# Patient Record
Sex: Male | Born: 1985
Health system: Southern US, Community
[De-identification: ages and names within clinical notes are randomized; demographics above are authoritative.]

## PROBLEM LIST (undated history)

## (undated) DIAGNOSIS — F1290 Cannabis use, unspecified, uncomplicated: Secondary | ICD-10-CM

## (undated) DIAGNOSIS — B20 Human immunodeficiency virus [HIV] disease: Secondary | ICD-10-CM

## (undated) DIAGNOSIS — I10 Essential (primary) hypertension: Secondary | ICD-10-CM

## (undated) HISTORY — DX: Human immunodeficiency virus (HIV) disease: B20

---

## 2006-05-04 ENCOUNTER — Emergency Department (HOSPITAL_COMMUNITY): Admission: EM | Admit: 2006-05-04 | Discharge: 2006-05-04 | Payer: Self-pay | Admitting: Emergency Medicine

## 2006-11-04 ENCOUNTER — Encounter: Admission: RE | Admit: 2006-11-04 | Discharge: 2006-11-04 | Payer: Self-pay | Admitting: Internal Medicine

## 2006-11-04 ENCOUNTER — Ambulatory Visit: Payer: Self-pay | Admitting: Internal Medicine

## 2006-11-04 ENCOUNTER — Encounter (INDEPENDENT_AMBULATORY_CARE_PROVIDER_SITE_OTHER): Payer: Self-pay | Admitting: *Deleted

## 2006-11-04 LAB — CONVERTED CEMR LAB: HIV-1 RNA Quant, Log: 4.68 — ABNORMAL HIGH (ref <1.70)

## 2006-11-05 ENCOUNTER — Encounter: Payer: Self-pay | Admitting: Internal Medicine

## 2006-11-05 LAB — CONVERTED CEMR LAB
ALT: 24 units/L (ref 0–53)
Albumin: 4.8 g/dL (ref 3.5–5.2)
Basophils Absolute: 0 10*3/uL (ref 0.0–0.1)
Basophils Relative: 0 % (ref 0–1)
Bilirubin Urine: NEGATIVE
CO2: 23 meq/L (ref 19–32)
Chlamydia, Swab/Urine, PCR: NEGATIVE
Chloride: 109 meq/L (ref 96–112)
Eosinophils Relative: 1 % (ref 0–5)
GC Probe Amp, Urine: NEGATIVE
HCV Ab: NEGATIVE
HIV-1 antibody: POSITIVE — AB
Hemoglobin: 14.2 g/dL (ref 13.0–17.0)
Ketones, ur: NEGATIVE mg/dL
Leukocytes, UA: NEGATIVE
Lymphs Abs: 2 10*3/uL (ref 0.7–3.3)
Monocytes Absolute: 0.4 10*3/uL (ref 0.2–0.7)
Monocytes Relative: 7 % (ref 3–11)
Nitrite: NEGATIVE
Potassium: 4.2 meq/L (ref 3.5–5.3)
RDW: 14.3 % — ABNORMAL HIGH (ref 11.5–14.0)
Specific Gravity, Urine: 1.029 (ref 1.005–1.03)
Urobilinogen, UA: 1 (ref 0.0–1.0)
WBC, UA: NONE SEEN cells/hpf (ref <3)
pH: 6 (ref 5.0–8.0)

## 2006-11-19 ENCOUNTER — Encounter: Payer: Self-pay | Admitting: Internal Medicine

## 2006-11-19 LAB — CONVERTED CEMR LAB: Hep B S Ab: POSITIVE

## 2006-11-23 DIAGNOSIS — B20 Human immunodeficiency virus [HIV] disease: Secondary | ICD-10-CM

## 2006-12-16 ENCOUNTER — Emergency Department (HOSPITAL_COMMUNITY): Admission: EM | Admit: 2006-12-16 | Discharge: 2006-12-16 | Payer: Self-pay | Admitting: *Deleted

## 2007-03-02 ENCOUNTER — Encounter: Payer: Self-pay | Admitting: Internal Medicine

## 2007-03-11 ENCOUNTER — Encounter: Payer: Self-pay | Admitting: Internal Medicine

## 2007-04-30 ENCOUNTER — Emergency Department (HOSPITAL_COMMUNITY): Admission: EM | Admit: 2007-04-30 | Discharge: 2007-04-30 | Payer: Self-pay | Admitting: Emergency Medicine

## 2007-07-07 ENCOUNTER — Emergency Department (HOSPITAL_COMMUNITY): Admission: EM | Admit: 2007-07-07 | Discharge: 2007-07-07 | Payer: Self-pay | Admitting: Emergency Medicine

## 2008-01-19 ENCOUNTER — Ambulatory Visit: Payer: Self-pay | Admitting: Internal Medicine

## 2008-01-19 ENCOUNTER — Encounter: Admission: RE | Admit: 2008-01-19 | Discharge: 2008-01-19 | Payer: Self-pay | Admitting: Internal Medicine

## 2008-01-19 LAB — CONVERTED CEMR LAB
AST: 28 units/L (ref 0–37)
Albumin: 4.5 g/dL (ref 3.5–5.2)
Alkaline Phosphatase: 69 units/L (ref 39–117)
Basophils Absolute: 0 10*3/uL (ref 0.0–0.1)
Basophils Relative: 0 % (ref 0–1)
Chloride: 108 meq/L (ref 96–112)
Eosinophils Absolute: 0.1 10*3/uL (ref 0.0–0.7)
GC Probe Amp, Urine: NEGATIVE
Glucose, Bld: 84 mg/dL (ref 70–99)
HIV-1 RNA Quant, Log: 3.45 — ABNORMAL HIGH (ref <1.70)
Lymphocytes Relative: 50 % — ABNORMAL HIGH (ref 12–46)
MCHC: 32.1 g/dL (ref 30.0–36.0)
MCV: 82.1 fL (ref 78.0–100.0)
Monocytes Absolute: 0.3 10*3/uL (ref 0.1–1.0)
Neutro Abs: 1.8 10*3/uL (ref 1.7–7.7)
Neutrophils Relative %: 40 % — ABNORMAL LOW (ref 43–77)
Platelets: 187 10*3/uL (ref 150–400)
Potassium: 3.8 meq/L (ref 3.5–5.3)
RDW: 14.1 % (ref 11.5–15.5)
Total Bilirubin: 0.4 mg/dL (ref 0.3–1.2)
Urine Glucose: NEGATIVE mg/dL

## 2008-02-01 ENCOUNTER — Ambulatory Visit: Payer: Self-pay | Admitting: Internal Medicine

## 2008-02-21 ENCOUNTER — Encounter: Payer: Self-pay | Admitting: Internal Medicine

## 2008-05-09 ENCOUNTER — Ambulatory Visit: Payer: Self-pay | Admitting: Internal Medicine

## 2008-05-09 LAB — CONVERTED CEMR LAB
ALT: 21 units/L (ref 0–53)
AST: 21 units/L (ref 0–37)
Alkaline Phosphatase: 61 units/L (ref 39–117)
Basophils Relative: 0 % (ref 0–1)
Eosinophils Absolute: 0.1 10*3/uL (ref 0.0–0.7)
Hemoglobin: 13.8 g/dL (ref 13.0–17.0)
Lymphocytes Relative: 49 % — ABNORMAL HIGH (ref 12–46)
Lymphs Abs: 2.2 10*3/uL (ref 0.7–4.0)
MCHC: 32.3 g/dL (ref 30.0–36.0)
MCV: 82.3 fL (ref 78.0–100.0)
Monocytes Absolute: 0.4 10*3/uL (ref 0.1–1.0)
Monocytes Relative: 10 % (ref 3–12)
Neutro Abs: 1.8 10*3/uL (ref 1.7–7.7)
Neutrophils Relative %: 40 % — ABNORMAL LOW (ref 43–77)
Platelets: 203 10*3/uL (ref 150–400)
RBC: 5.19 M/uL (ref 4.22–5.81)
RDW: 13.8 % (ref 11.5–15.5)
Total Bilirubin: 0.3 mg/dL (ref 0.3–1.2)
Total Protein: 8.8 g/dL — ABNORMAL HIGH (ref 6.0–8.3)
WBC: 4.6 10*3/uL (ref 4.0–10.5)

## 2008-06-26 ENCOUNTER — Emergency Department (HOSPITAL_COMMUNITY): Admission: EM | Admit: 2008-06-26 | Discharge: 2008-06-26 | Payer: Self-pay | Admitting: Emergency Medicine

## 2008-10-02 ENCOUNTER — Encounter: Payer: Self-pay | Admitting: Internal Medicine

## 2008-10-17 ENCOUNTER — Encounter: Payer: Self-pay | Admitting: Internal Medicine

## 2009-01-04 ENCOUNTER — Emergency Department (HOSPITAL_COMMUNITY): Admission: EM | Admit: 2009-01-04 | Discharge: 2009-01-04 | Payer: Self-pay | Admitting: Cardiology

## 2009-04-13 ENCOUNTER — Emergency Department (HOSPITAL_COMMUNITY): Admission: EM | Admit: 2009-04-13 | Discharge: 2009-04-13 | Payer: Self-pay | Admitting: Emergency Medicine

## 2009-04-19 ENCOUNTER — Emergency Department (HOSPITAL_COMMUNITY): Admission: EM | Admit: 2009-04-19 | Discharge: 2009-04-19 | Payer: Self-pay | Admitting: Emergency Medicine

## 2009-11-26 ENCOUNTER — Encounter: Payer: Self-pay | Admitting: Internal Medicine

## 2009-11-26 ENCOUNTER — Emergency Department (HOSPITAL_COMMUNITY): Admission: EM | Admit: 2009-11-26 | Discharge: 2009-11-26 | Payer: Self-pay | Admitting: Emergency Medicine

## 2009-12-05 ENCOUNTER — Ambulatory Visit: Payer: Self-pay | Admitting: Internal Medicine

## 2009-12-05 LAB — CONVERTED CEMR LAB
ALT: 15 units/L (ref 0–53)
AST: 16 units/L (ref 0–37)
Alkaline Phosphatase: 71 units/L (ref 39–117)
BUN: 10 mg/dL (ref 6–23)
Basophils Relative: 0 % (ref 0–1)
CO2: 23 meq/L (ref 19–32)
Calcium: 9.6 mg/dL (ref 8.4–10.5)
Cholesterol: 140 mg/dL (ref 0–200)
Eosinophils Absolute: 0.1 10*3/uL (ref 0.0–0.7)
Eosinophils Relative: 2 % (ref 0–5)
GC Probe Amp, Urine: NEGATIVE
HIV-1 RNA Quant, Log: 2.82 — ABNORMAL HIGH (ref <1.68)
HIV-1 antibody: POSITIVE — AB
HIV-2 Ab: UNDETERMINED — AB
HIV: REACTIVE
Hemoglobin: 12.2 g/dL — ABNORMAL LOW (ref 13.0–17.0)
Hepatitis B Surface Ag: NEGATIVE
Ketones, ur: NEGATIVE mg/dL
Leukocytes, UA: NEGATIVE
Lymphocytes Relative: 44 % (ref 12–46)
Lymphs Abs: 2.1 10*3/uL (ref 0.7–4.0)
Monocytes Absolute: 0.3 10*3/uL (ref 0.1–1.0)
Neutro Abs: 2.3 10*3/uL (ref 1.7–7.7)
Potassium: 3.8 meq/L (ref 3.5–5.3)
RBC: 4.66 M/uL (ref 4.22–5.81)
RDW: 13.2 % (ref 11.5–15.5)
Sodium: 140 meq/L (ref 135–145)
Total Bilirubin: 0.3 mg/dL (ref 0.3–1.2)
Total CHOL/HDL Ratio: 5.6
Triglycerides: 302 mg/dL — ABNORMAL HIGH (ref <150)
Urine Glucose: NEGATIVE mg/dL
WBC: 4.8 10*3/uL (ref 4.0–10.5)

## 2009-12-23 ENCOUNTER — Encounter: Payer: Self-pay | Admitting: Internal Medicine

## 2009-12-24 ENCOUNTER — Ambulatory Visit: Payer: Self-pay | Admitting: Internal Medicine

## 2009-12-24 DIAGNOSIS — I10 Essential (primary) hypertension: Secondary | ICD-10-CM | POA: Insufficient documentation

## 2010-10-14 NOTE — Miscellaneous (Signed)
Summary: Orders Update  Clinical Lists Changes  Orders: Added new Test order of T-Comprehensive Metabolic Panel [phone]) - Signed Added new Test order of T-CBC w/Diff [phone]) - Signed Added new Test order of T-CD4 [phone]) - Signed Added new Test order of T-HIV Viral Load [phone]) - Signed Added new Test order of T-Chlamydia  Probe, urine [id number]) - Signed Added new Test order of T-GC Probe, urine [phone]) - Signed Added new Test order of T-Hepatitis B Surface Antigen [phone]) - Signed Added new Test order of T-Hepatitis B Surface Antibody ([id number]) - Signed Added new Test order of T-Hepatitis B Core Antibody ([id number]) - Signed Added new Test order of T-Hepatitis C Antibody ([id number]) - Signed Added new Test order of T-HIV Antibody  (Reflex) ([id number]) - Signed Added new Test order of T-HIV Ab Confirmatory Test/Western Blot ([id number]) - Signed Added new Test order of T-Lipid Profile ([id number]) - Signed Added new Test order of T-RPR (Syphilis) ([id number]) - Signed Added new Test order of T-Urinalysis ([id number]) - Signed       Process Orders Check Orders Results:     [facility]: ABN not required for this insurance Tests Sent for requisitioning (December 03, 2009 3:02 PM):     01/10/2010: [facility] -- T-Comprehensive Metabolic Panel [[id number]] (signed)     01/10/2010: [facility] -- T-CBC w/Diff [[id number]] (signed)     01/10/2010: [facility] -- T-CD4 [phone] (signed)     01/10/2010: [facility] -- T-HIV Viral Load [phone] (signed)     01/10/2010: [facility] -- T-Chlamydia  Probe, urine [phone] (signed)     01/10/2010: [facility] -- T-GC Probe, urine [phone] (signed)     01/10/2010: [facility] -- T-Hepatitis B Surface Antigen [[id number]] (signed)     01/10/2010:  [facility] -- T-Hepatitis B Surface Antibody [[id number]] (signed)     01/10/2010: [facility] -- T-Hepatitis B Core Antibody [[id number]] (signed)     01/10/2010: [facility] -- T-Hepatitis C Antibody [[id number]] (signed)     01/10/2010: [facility] -- T-HIV Antibody  (Reflex) [[id number]] (signed)     01/10/2010: [facility] -- T-HIV Ab Confirmatory Test/Western Blot [phone] (signed)     01/10/2010: [facility] -- T-Lipid Profile [phone] (signed)     01/10/2010: [facility] -- T-RPR (Syphilis) [phone] (signed)     01/10/2010: [facility] -- T-Urinalysis [[id number]] (signed)

## 2010-10-14 NOTE — Assessment & Plan Note (Signed)
Summary: routine ck/last seen 09/vs   CC:  follow-up visit, lab results, B/P high today, and complaining of frequent cold symptoms.  History of Present Illness: Pt c/o night sweats and recurrent colds particularly when he is around children.  He was concerned it might be related to his HIV so he came in to get his labs checked. He has also been told at the health dept that his BP is elevated. Pt also needs a PPD for work.  Preventive Screening-Counseling & Management  Alcohol-Tobacco     Alcohol drinks/day: week ends     Smoking Status: current     Packs/Day: 1/2  Caffeine-Diet-Exercise     Caffeine use/day: soda occasional     Does Patient Exercise: yes     Type of exercise: running     Times/week: 3  Safety-Violence-Falls     Seat Belt Use: yes      Drug Use:  Yes.     Updated Prior Medication List: ATENOLOL 50 MG TABS (ATENOLOL) Take 1 tablet by mouth once a day  Current Allergies (reviewed today): No known allergies  Past History:  Past Medical History: Last updated: 11/19/2006 HIV disease  Review of Systems  The patient denies anorexia, fever, weight loss, chest pain, and headaches.    Vital Signs:  Patient profile:   25 year old male Height:      70 inches (177.80 cm) Weight:      197.0 pounds (89.55 kg) BMI:     28.37 Temp:     98.4 degrees F (36.89 degrees C) oral Pulse rate:   64 / minute BP sitting:   160 / 94  (right arm)  Vitals Entered By: Wendall Mola CMA Duncan Dull) (December 24, 2009 3:01 PM) CC: follow-up visit, lab results, B/P high today, complaining of frequent cold symptoms Is Patient Diabetic? No Pain Assessment Patient in pain? no      Nutritional Status BMI of 25 - 29 = overweight Nutritional Status Detail appetite "sometimes not good"  Have you ever been in a relationship where you felt threatened, hurt or afraid?No   Does patient need assistance? Functional Status Self care Ambulation Normal   Physical Exam  General:   alert, well-developed, well-nourished, and well-hydrated.   Head:  normocephalic and atraumatic.   Mouth:  pharynx pink and moist.  no thrush  Lungs:  normal breath sounds.   Heart:  normal rate and regular rhythm.      Impression & Recommendations:  Problem # 1:  HIV DISEASE (ICD-042) Pt currently not on therapy. We discussed starting treatment even though his CD4ct is 790 and VL low since he is having symptoms that may or may not be related to his HIV.  Pt would like to hold off for now. He will return in 6 months for repeat labs. PPD was placed. Diagnostics Reviewed:  HIV: REACTIVE (12/05/2009)   HIV-Western blot: Positive (12/05/2009)   CD4: 790 (12/06/2009)   WBC: 4.8 (12/05/2009)   Hgb: 12.2 (12/05/2009)   HCT: 38.1 (12/05/2009)   Platelets: 237 (12/05/2009) HIV-1 RNA: 666 (12/05/2009)   HBSAg: NEG (12/05/2009)  Problem # 2:  ESSENTIAL HYPERTENSION, BENIGN (ICD-401.1) Will start atenolol and re-check BP. His updated medication list for this problem includes:    Atenolol 50 Mg Tabs (Atenolol) .Marland Kitchen... Take 1 tablet by mouth once a day  BP today: 160/94 Prior BP: 159/88 (02/01/2008)  Labs Reviewed: K+: 3.8 (12/05/2009) Creat: : 0.94 (12/05/2009)   Chol: 140 (12/05/2009)   HDL: 25 (  12/05/2009)   LDL: 55 (12/05/2009)   TG: 302 (12/05/2009)  Medications Added to Medication List This Visit: 1)  Atenolol 50 Mg Tabs (Atenolol) .... Take 1 tablet by mouth once a day  Other Orders: Est. Patient Level III (04540) TB Skin Test [phone]) Admin 1st Vaccine (14782) Future Orders: T-CD4SP ([facility]) (CD4SP) ... 06/22/2010 T-HIV Viral Load [id number]) ... 06/22/2010 T-Comprehensive Metabolic Panel [phone]) ... 06/22/2010 T-CBC w/Diff ([id number]) ... 06/22/2010  Patient Instructions: 1)  Please schedule a follow-up appointment in 6 months, 2 weeks after labs.  Prescriptions: ATENOLOL 50 MG TABS (ATENOLOL) Take 1 tablet by mouth once a day  #30 x 5   Entered and Authorized by:    Yisroel Ramming MD   Signed by:   Yisroel Ramming MD on 12/24/2009   Method used:   Print then Give to Patient   RxID:   [id number]    Immunizations Administered:  PPD Skin Test:    Vaccine Type: PPD    Site: left forearm    Mfr: Sanofi Pasteur    Dose: 0.1 ml    Route: ID    Given by: Wendall Mola CMA ( AAMA)    Exp. Date: 01/17/2012    Lot #: [id number]   Appended Document: TB skin test negative    Clinical Lists Changes  Observations: Added new observation of TB PPDRESULT: negative (12/26/2009 15:28) Added new observation of PPD RESULT: < 5mm (12/26/2009 15:28) Added new observation of TB-PPD RDDTE: 12/26/2009 (12/26/2009 15:28)       PPD Results    Date of reading: 12/26/2009    Results: < 5mm    Interpretation: negative

## 2010-10-14 NOTE — Miscellaneous (Signed)
Summary: [facility]   Imported By: Margie Billet 12/24/2009 11:22:19  _____________________________________________________________________  External Attachment:    Type:   Image     Comment:   External Document

## 2010-12-07 LAB — CULTURE, ROUTINE-ABSCESS

## 2010-12-07 LAB — T-HELPER CELL (CD4) - (RCID CLINIC ONLY)
CD4 % Helper T Cell: 39 % (ref 33–55)
CD4 T Cell Abs: 790 uL (ref 400–2700)

## 2010-12-20 LAB — URINALYSIS, ROUTINE W REFLEX MICROSCOPIC
Bilirubin Urine: NEGATIVE
Nitrite: NEGATIVE
pH: 6.5 (ref 5.0–8.0)

## 2010-12-20 LAB — GONOCOCCUS DNA, PCR: GC Probe Amp, Genital: NEGATIVE

## 2011-01-07 ENCOUNTER — Other Ambulatory Visit: Payer: Self-pay | Admitting: *Deleted

## 2011-01-07 DIAGNOSIS — B20 Human immunodeficiency virus [HIV] disease: Secondary | ICD-10-CM

## 2011-01-07 DIAGNOSIS — I10 Essential (primary) hypertension: Secondary | ICD-10-CM

## 2011-01-15 ENCOUNTER — Other Ambulatory Visit: Payer: Self-pay | Admitting: Adult Health

## 2011-01-15 ENCOUNTER — Other Ambulatory Visit: Payer: Self-pay

## 2011-01-15 DIAGNOSIS — Z79899 Other long term (current) drug therapy: Secondary | ICD-10-CM

## 2011-01-15 DIAGNOSIS — B20 Human immunodeficiency virus [HIV] disease: Secondary | ICD-10-CM

## 2011-01-15 DIAGNOSIS — Z113 Encounter for screening for infections with a predominantly sexual mode of transmission: Secondary | ICD-10-CM

## 2011-01-29 ENCOUNTER — Ambulatory Visit: Payer: Self-pay | Admitting: Adult Health

## 2011-06-26 LAB — URINE CULTURE: Colony Count: NO GROWTH

## 2011-06-26 LAB — URINE MICROSCOPIC-ADD ON

## 2011-06-26 LAB — URINALYSIS, ROUTINE W REFLEX MICROSCOPIC
Bilirubin Urine: NEGATIVE
Glucose, UA: NEGATIVE
Hgb urine dipstick: NEGATIVE
Nitrite: NEGATIVE
Urobilinogen, UA: 0.2
pH: 6

## 2011-06-26 LAB — GC/CHLAMYDIA PROBE AMP, GENITAL: Chlamydia, DNA Probe: POSITIVE — AB

## 2012-06-28 ENCOUNTER — Encounter (HOSPITAL_COMMUNITY): Payer: Self-pay | Admitting: Emergency Medicine

## 2012-06-28 ENCOUNTER — Emergency Department (HOSPITAL_COMMUNITY)
Admission: EM | Admit: 2012-06-28 | Discharge: 2012-06-29 | Disposition: A | Discharge status: Home / Self Care | Payer: Self-pay | Attending: Emergency Medicine | Admitting: Emergency Medicine

## 2012-06-28 ENCOUNTER — Encounter (HOSPITAL_COMMUNITY): Payer: Self-pay | Admitting: *Deleted

## 2012-06-28 ENCOUNTER — Emergency Department (INDEPENDENT_AMBULATORY_CARE_PROVIDER_SITE_OTHER)
Admission: EM | Admit: 2012-06-28 | Discharge: 2012-06-28 | Disposition: A | Discharge status: Nursing Home (Includes ICF) | Payer: Self-pay | Source: Home / Self Care | Attending: Family Medicine | Admitting: Family Medicine

## 2012-06-28 DIAGNOSIS — I10 Essential (primary) hypertension: Secondary | ICD-10-CM | POA: Insufficient documentation

## 2012-06-28 DIAGNOSIS — H409 Unspecified glaucoma: Secondary | ICD-10-CM

## 2012-06-28 DIAGNOSIS — F17200 Nicotine dependence, unspecified, uncomplicated: Secondary | ICD-10-CM | POA: Insufficient documentation

## 2012-06-28 DIAGNOSIS — H5789 Other specified disorders of eye and adnexa: Secondary | ICD-10-CM | POA: Insufficient documentation

## 2012-06-28 DIAGNOSIS — H40219 Acute angle-closure glaucoma, unspecified eye: Secondary | ICD-10-CM

## 2012-06-28 DIAGNOSIS — H209 Unspecified iridocyclitis: Secondary | ICD-10-CM

## 2012-06-28 HISTORY — DX: Essential (primary) hypertension: I10

## 2012-06-28 MED ORDER — PREDNISOLONE ACETATE 1 % OP SUSP
2.0000 [drp] | OPHTHALMIC | Status: DC
  Administered 2012-06-29: 2 [drp] via OPHTHALMIC
  Filled 2012-06-28: qty 1

## 2012-06-28 MED ORDER — TETRACAINE HCL 0.5 % OP SOLN
2.0000 [drp] | Freq: Once | OPHTHALMIC | Status: AC
  Administered 2012-06-28: 2 [drp] via OPHTHALMIC
  Filled 2012-06-28: qty 2

## 2012-06-28 MED ORDER — TETRACAINE HCL 0.5 % OP SOLN
OPHTHALMIC | Status: AC
  Filled 2012-06-28: qty 2

## 2012-06-28 NOTE — ED Provider Notes (Signed)
History     CSN: [id number]  Arrival date & time 06/28/12  1954   First MD Initiated Contact with Patient 06/28/12 1956      No chief complaint on file.   (Consider location/radiation/quality/duration/timing/severity/associated sxs/prior treatment) Patient is a 26 y.o. male presenting with eye pain. The history is provided by the patient.  Eye Pain This is a new problem. The current episode started more than 2 days ago. The problem has been gradually worsening. Associated symptoms comments: Photophobia  And eye redness..    No past medical history on file.  No past surgical history on file.  No family history on file.  History  Substance Use Topics  . Smoking status: Not on file  . Smokeless tobacco: Not on file  . Alcohol Use: Not on file      Review of Systems  Constitutional: Negative.   HENT: Negative.   Eyes: Positive for photophobia, pain and redness. Negative for discharge, itching and visual disturbance.    Allergies  Review of patient's allergies indicates not on file.  Home Medications   Current Outpatient Rx  Name Route Sig Dispense Refill  . ATENOLOL 50 MG PO TABS Oral Take 50 mg by mouth daily.        BP 212/115  Pulse 78  Temp 99.1 F (37.3 C) (Oral)  Resp 18  SpO2 100%  Physical Exam  Nursing note and vitals reviewed. Constitutional: He appears well-developed and well-nourished.  HENT:  Head: Normocephalic.  Right Ear: External ear normal.  Left Ear: External ear normal.  Mouth/Throat: Oropharynx is clear and moist.  Eyes: EOM are normal. Pupils are equal, round, and reactive to light. Right eye exhibits no discharge. Left eye exhibits no discharge. Right conjunctiva is injected. Left conjunctiva is not injected. Left conjunctiva has no hemorrhage.  Slit lamp exam:      The right eye shows no corneal abrasion, no corneal flare, no corneal ulcer, no foreign body, no hyphema, no hypopyon, no fluorescein uptake and no anterior chamber  bulge.    ED Course  Procedures (including critical care time)  Labs Reviewed - No data to display No results found.   1. Acute glaucoma       MDM  tonopen IOP x2 were 32 and 38 by 2 different operators.        Linna Hoff, MD 06/28/12 2113

## 2012-06-28 NOTE — ED Notes (Signed)
Report received from RN Kim at [facility].  Sent here for eye pain, redness and increased intraocular pressure.  Pt with hx of htn and has not been taking bp meds for 6 months.
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
## 2012-06-28 NOTE — ED Notes (Signed)
Eye redness , pain, unknown injury

## 2012-06-28 NOTE — ED Provider Notes (Signed)
History     CSN: [id number]  Arrival date & time 06/28/12  2126   First MD Initiated Contact with Patient 06/28/12 2209      Chief Complaint  Patient presents with  . Eye Injury  . Pressure Behind the Eyes    (Consider location/radiation/quality/duration/timing/severity/associated sxs/prior treatment) Patient is a 26 y.o. male presenting with eye injury. The history is provided by the patient.  Eye Injury   Patient here with right thigh redness and photophobia x2 days. Patient feels pressure behind his eyes. Denies any vision loss of the right side. History of similar symptoms in the past for which she gets steroid shots. He has never been diagnosed with glaucoma although told she did have an increased intraocular pressure. He went to urgent care Center today and his intraocular pressure was elevated and he was sent here. He denies any headache, vomiting. Does note pain in his right eye when light shown in the left. Denies any foreign body sensation. Patient denies any blurred vision. Patient denies any pain his temple Past Medical History  Diagnosis Date  . Hypertension     History reviewed. No pertinent past surgical history.  History reviewed. No pertinent family history.  History  Substance Use Topics  . Smoking status: Current Every Day Smoker  . Smokeless tobacco: Not on file  . Alcohol Use: No      Review of Systems  All other systems reviewed and are negative.    Allergies  Review of patient's allergies indicates no known allergies.  Home Medications  No current outpatient prescriptions on file.  BP 163/119  Pulse 68  Temp 98.9 F (37.2 C) (Oral)  Resp 16  SpO2 98%  Physical Exam  Nursing note and vitals reviewed. Constitutional: He is oriented to person, place, and time. He appears well-developed and well-nourished.  Non-toxic appearance. No distress.  HENT:  Head: Normocephalic and atraumatic.  Eyes: EOM and lids are normal. Pupils are equal,  round, and reactive to light. Right eye exhibits no discharge and no exudate. Right conjunctiva is injected. Right conjunctiva has no hemorrhage. No scleral icterus. Right eye exhibits normal extraocular motion and no nystagmus. Pupils are equal.       No signs of afferent pupillary light defect.  Neck: Normal range of motion. Neck supple. No tracheal deviation present. No mass present.  Cardiovascular: Normal rate, regular rhythm and normal heart sounds.  Exam reveals no gallop.   No murmur heard. Pulmonary/Chest: Effort normal and breath sounds normal. No stridor. No respiratory distress. He has no decreased breath sounds. He has no wheezes. He has no rhonchi. He has no rales.  Abdominal: Soft. Normal appearance and bowel sounds are normal. He exhibits no distension. There is no tenderness. There is no rebound and no CVA tenderness.  Musculoskeletal: Normal range of motion. He exhibits no edema and no tenderness.  Neurological: He is alert and oriented to person, place, and time. He has normal strength. No cranial nerve deficit or sensory deficit. GCS eye subscore is 4. GCS verbal subscore is 5. GCS motor subscore is 6.  Skin: Skin is warm and dry. No abrasion and no rash noted.  Psychiatric: He has a normal mood and affect. His speech is normal and behavior is normal.    ED Course  Procedures (including critical care time)  Labs Reviewed - No data to display No results found.   No diagnosis found.    MDM  Patient had tonopen performed here and I pressures measured  10,9 and 12. We'll speak with ophthalmology on call  11:05 PM Spoke with the ophthalmologist on call, dr. Clarisa Kindred, pt to be started on Pred Forte here and given a bottle to go home with. The patient will be seen by her tomorrow in the office. 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 He has no headache he has no vomiting hasn't visual changes his cornea is normal. Patient relates having similar symptoms in the past treated with  steroids which reason to believe that patient likely has iritis.     Toy Baker, MD 06/28/12 [id number]

## 2012-06-29 NOTE — ED Notes (Signed)
Charge nurse called for taxi voucher since pt missed bus due to waiting for pharmacy to send his medication.

## 2012-06-29 NOTE — ED Notes (Signed)
Request for med sent to pharmacy and pharmacy called and told that pt needed med for discharge

## 2012-06-29 NOTE — ED Notes (Signed)
Pt given taxi voucher by charge nurse

## 2012-06-29 NOTE — ED Notes (Signed)
Pharmacy called again for medication for pt discharge

## 2013-02-07 ENCOUNTER — Ambulatory Visit: Payer: Self-pay

## 2013-02-07 ENCOUNTER — Ambulatory Visit (INDEPENDENT_AMBULATORY_CARE_PROVIDER_SITE_OTHER): Payer: Self-pay

## 2013-02-07 DIAGNOSIS — B20 Human immunodeficiency virus [HIV] disease: Secondary | ICD-10-CM

## 2013-02-07 DIAGNOSIS — I10 Essential (primary) hypertension: Secondary | ICD-10-CM

## 2013-02-07 DIAGNOSIS — Z23 Encounter for immunization: Secondary | ICD-10-CM

## 2013-02-07 LAB — CBC WITH DIFFERENTIAL/PLATELET
Eosinophils Absolute: 0.1 10*3/uL (ref 0.0–0.7)
Eosinophils Relative: 3 % (ref 0–5)
Hemoglobin: 12.4 g/dL — ABNORMAL LOW (ref 13.0–17.0)
Lymphocytes Relative: 48 % — ABNORMAL HIGH (ref 12–46)
Lymphs Abs: 1.8 10*3/uL (ref 0.7–4.0)
MCH: 26.8 pg (ref 26.0–34.0)
MCV: 77.5 fL — ABNORMAL LOW (ref 78.0–100.0)
Monocytes Absolute: 0.4 10*3/uL (ref 0.1–1.0)
Monocytes Relative: 9 % (ref 3–12)
Neutrophils Relative %: 40 % — ABNORMAL LOW (ref 43–77)
Platelets: 203 10*3/uL (ref 150–400)
RBC: 4.62 MIL/uL (ref 4.22–5.81)
RDW: 14.9 % (ref 11.5–15.5)

## 2013-02-07 LAB — COMPLETE METABOLIC PANEL WITH GFR
AST: 25 U/L (ref 0–37)
Albumin: 4.2 g/dL (ref 3.5–5.2)
Alkaline Phosphatase: 72 U/L (ref 39–117)
Chloride: 107 mEq/L (ref 96–112)
Creat: 1.07 mg/dL (ref 0.50–1.35)
GFR, Est Non African American: >89 mL/min
Glucose, Bld: 85 mg/dL (ref 70–99)
Potassium: 4 mEq/L (ref 3.5–5.3)
Total Protein: 8.4 g/dL — ABNORMAL HIGH (ref 6.0–8.3)

## 2013-02-07 LAB — LIPID PANEL
HDL: 34 mg/dL — ABNORMAL LOW (ref >39)
LDL Cholesterol: 76 mg/dL (ref 0–99)
Triglycerides: 115 mg/dL (ref <150)

## 2013-02-07 LAB — HEPATITIS C ANTIBODY: HCV Ab: NEGATIVE

## 2013-02-08 LAB — HEPATITIS A ANTIBODY, TOTAL: Hep A Total Ab: NEGATIVE

## 2013-02-08 LAB — URINALYSIS
Hgb urine dipstick: NEGATIVE
Ketones, ur: NEGATIVE mg/dL
Leukocytes, UA: NEGATIVE
Nitrite: NEGATIVE
Specific Gravity, Urine: 1.027 (ref 1.005–1.030)
Urobilinogen, UA: 0.2 mg/dL (ref 0.0–1.0)
pH: 6 (ref 5.0–8.0)

## 2013-02-08 LAB — HEPATITIS B CORE ANTIBODY, TOTAL: Hep B Core Total Ab: NEGATIVE

## 2013-02-08 LAB — T-HELPER CELL (CD4) - (RCID CLINIC ONLY): CD4 T Cell Abs: 550 uL (ref 400–2700)

## 2013-02-08 LAB — HIV-1 RNA ULTRAQUANT REFLEX TO GENTYP+: HIV 1 RNA Quant: 8613 copies/mL — ABNORMAL HIGH (ref <20)

## 2013-02-14 LAB — TB SKIN TEST
Induration: 0 mm
TB Skin Test: NEGATIVE

## 2013-02-17 NOTE — Progress Notes (Signed)
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  She has concerns about her CD 4 decreasing and being diagnosed with AIDS. She has been checking her BP was has been elevated several times and last reading was 174/126 and admits to her Mother sharing blood pressure medication.  Since she has no insurance she did not seek medical attention .   Laurell Josephs, RN

## 2013-02-22 ENCOUNTER — Ambulatory Visit (INDEPENDENT_AMBULATORY_CARE_PROVIDER_SITE_OTHER): Payer: Self-pay | Admitting: Internal Medicine

## 2013-02-22 ENCOUNTER — Encounter: Payer: Self-pay | Admitting: Internal Medicine

## 2013-02-22 VITALS — BP 168/112 | HR 67 | Temp 98.6°F | Ht 70.0 in | Wt 197.0 lb

## 2013-02-22 DIAGNOSIS — F17200 Nicotine dependence, unspecified, uncomplicated: Secondary | ICD-10-CM

## 2013-02-22 DIAGNOSIS — B20 Human immunodeficiency virus [HIV] disease: Secondary | ICD-10-CM

## 2013-02-22 DIAGNOSIS — Z720 Tobacco use: Secondary | ICD-10-CM

## 2013-02-22 DIAGNOSIS — I10 Essential (primary) hypertension: Secondary | ICD-10-CM

## 2013-02-22 MED ORDER — AMLODIPINE BESYLATE 5 MG PO TABS: 5.0000 mg | ORAL_TABLET | Freq: Every day | ORAL | Status: DC

## 2013-02-22 MED ORDER — ELVITEG-COBIC-EMTRICIT-TENOFDF 150-150-200-300 MG PO TABS: 1.0000 | ORAL_TABLET | Freq: Every day | ORAL | Status: DC

## 2013-02-22 NOTE — Assessment & Plan Note (Signed)
He is interested in treatment I did discuss with him treatment options including Stribild. I did remind him to take it with food to avoid nausea. He has applied for drug assistance program and wants to get the medication he will start it. I will followup with him in 3 weeks to assure he got the medication and also follow his blood pressure.

## 2013-02-22 NOTE — Assessment & Plan Note (Signed)
I did discuss smoking cessation. He is not interested any help at this time. He is going to try to quit on his own though did not necessarily have the plan. He does understand the importance with high blood pressure of smoking cessation.

## 2013-02-22 NOTE — Progress Notes (Signed)
  Subjective:    Patient ID: Clifford Shields, male    DOB: [birth year], 27 y.o.   MRN: [id number]  HPI He comes in to reestablish care for HIV. He was initially diagnosed in 2007 and was seen in clinic here in 2008 however had remained off treatment due to previous guidelines the did not recommend treatment above 500 for his CD4 count. He then moved to [place] and then [place] before returning last year to [place]. He remained ARV nave. He is here interested in starting therapy.  He does describe not wanting treatment previously however understands now the importance of treatment. He does feel he will be able to take medicine daily. He is also interested in hormone therapy as a transgender male to male however is also nervous about side effects from hormone therapy. He has no weight loss, no diarrhea. His risk factor is MSM. He understands importance of condoms. He is hoping to get his own place. No rashes, lymphadenopathy, no abdominal pain. He has some minimal leg edema. No headaches or vision changes. He does take atenolol from his friend since he has noted significantly elevated blood pressure.  He also used to take atenolol when he previously came to this clinic for his high blood pressure.   Review of Systems  Constitutional: Positive for fatigue. Negative for fever and unexpected weight change.  HENT: Negative for sore throat and trouble swallowing.   Eyes: Negative for pain and visual disturbance.  Respiratory: Negative for cough and shortness of breath.   Cardiovascular: Positive for leg swelling. Negative for chest pain and palpitations.  Gastrointestinal: Negative for nausea, abdominal pain and diarrhea.  Genitourinary: Negative for dysuria, hematuria, difficulty urinating and genital sores.  Musculoskeletal: Negative for myalgias, joint swelling and arthralgias.  Skin: Negative for rash.  Neurological: Negative for dizziness, weakness, light-headedness and headaches.   Hematological: Negative for adenopathy.  Psychiatric/Behavioral: Negative for dysphoric mood.       Objective:   Physical Exam  Constitutional: He is oriented to person, place, and time. He appears well-developed and well-nourished. No distress.  HENT:  Mouth/Throat: Oropharynx is clear and moist. No oropharyngeal exudate.  Eyes: Right eye exhibits no discharge. Left eye exhibits no discharge. No scleral icterus.  Cardiovascular: Normal rate, regular rhythm and normal heart sounds.   No murmur heard. Pulmonary/Chest: Effort normal and breath sounds normal. No respiratory distress. He has no wheezes. He has no rales.  Abdominal: Soft. Bowel sounds are normal. He exhibits no distension. There is no tenderness.  Musculoskeletal: He exhibits no edema.  Lymphadenopathy:    He has no cervical adenopathy.  Neurological: He is alert and oriented to person, place, and time.  Skin: Skin is warm and dry. No rash noted.          Assessment & Plan:

## 2013-02-22 NOTE — Assessment & Plan Note (Signed)
His blood pressure is poorly controlled at this time. I will try him on Norvasc 5 mg first and see what his blood pressure is on that at his next visit. Did discuss the long-term effects of high blood pressure including kidney problems and cardiac issues

## 2013-03-06 ENCOUNTER — Other Ambulatory Visit: Payer: Self-pay | Admitting: Licensed Clinical Social Worker

## 2013-03-06 DIAGNOSIS — B20 Human immunodeficiency virus [HIV] disease: Secondary | ICD-10-CM

## 2013-03-06 DIAGNOSIS — I10 Essential (primary) hypertension: Secondary | ICD-10-CM

## 2013-03-06 MED ORDER — ELVITEG-COBIC-EMTRICIT-TENOFDF 150-150-200-300 MG PO TABS: 1.0000 | ORAL_TABLET | Freq: Every day | ORAL | Status: DC

## 2013-03-06 MED ORDER — AMLODIPINE BESYLATE 5 MG PO TABS: 5.0000 mg | ORAL_TABLET | Freq: Every day | ORAL | Status: DC

## 2013-03-15 ENCOUNTER — Encounter: Payer: Self-pay | Admitting: Internal Medicine

## 2013-03-15 ENCOUNTER — Ambulatory Visit (INDEPENDENT_AMBULATORY_CARE_PROVIDER_SITE_OTHER): Payer: Self-pay | Admitting: Internal Medicine

## 2013-03-15 VITALS — BP 158/96 | HR 74 | Temp 98.6°F | Ht 70.0 in | Wt 196.0 lb

## 2013-03-15 DIAGNOSIS — B20 Human immunodeficiency virus [HIV] disease: Secondary | ICD-10-CM

## 2013-03-15 DIAGNOSIS — F17200 Nicotine dependence, unspecified, uncomplicated: Secondary | ICD-10-CM

## 2013-03-15 DIAGNOSIS — Z720 Tobacco use: Secondary | ICD-10-CM

## 2013-03-15 DIAGNOSIS — I10 Essential (primary) hypertension: Secondary | ICD-10-CM

## 2013-03-16 NOTE — Assessment & Plan Note (Signed)
I again discussed smoking cessation and she is pre-contemplative at this time.

## 2013-03-16 NOTE — Progress Notes (Signed)
  Subjective:    Patient ID: Kaid Seeberger, male    DOB: 1985/12/25, 27 y.o.   MRN: [id number]  HPI She comes in for followup of HIV. She previously was previously anti retroviral nave but started on Stribild after her last visit.  She started about one week ago. She is having no side effects and feels good. No nausea, no rash. She is tolerating well and has not missed any doses since starting. She also started Norvasc for her blood pressure. She continues to smoke. No diarrhea   Review of Systems  Constitutional: Negative for fatigue and unexpected weight change.  HENT: Negative for sore throat and trouble swallowing.   Respiratory: Negative for shortness of breath.   Gastrointestinal: Negative for nausea, abdominal pain and diarrhea.  Skin: Negative for rash.  Neurological: Negative for dizziness, light-headedness and headaches.  Psychiatric/Behavioral: Negative for dysphoric mood.       Objective:   Physical Exam  Constitutional: He appears well-developed and well-nourished. No distress.  HENT:  Mouth/Throat: Oropharynx is clear and moist. No oropharyngeal exudate.  Cardiovascular: Normal rate, regular rhythm and normal heart sounds.   No murmur heard. Pulmonary/Chest: Effort normal and breath sounds normal. No respiratory distress. He has no wheezes.  Lymphadenopathy:    He has no cervical adenopathy.  Skin: Skin is warm and dry. No rash noted.  Psychiatric: He has a normal mood and affect. His behavior is normal.          Assessment & Plan:

## 2013-03-16 NOTE — Assessment & Plan Note (Signed)
Her blood pressure is a little bit better. At this time, she does not want additional medications and therefore will be watched. If it continues to remain elevated, I will consider adding therapy.

## 2013-03-16 NOTE — Assessment & Plan Note (Signed)
She is doing well on the new regimen and will return in about 2 weeks for labs and I will see her after that.

## 2013-03-29 ENCOUNTER — Other Ambulatory Visit: Payer: Self-pay

## 2013-04-05 ENCOUNTER — Ambulatory Visit: Payer: Self-pay

## 2013-04-05 ENCOUNTER — Ambulatory Visit: Payer: Self-pay | Admitting: Internal Medicine

## 2013-04-12 ENCOUNTER — Other Ambulatory Visit (INDEPENDENT_AMBULATORY_CARE_PROVIDER_SITE_OTHER): Payer: Self-pay

## 2013-04-12 DIAGNOSIS — B20 Human immunodeficiency virus [HIV] disease: Secondary | ICD-10-CM

## 2013-04-12 LAB — CBC WITH DIFFERENTIAL/PLATELET
Basophils Relative: 0 % (ref 0–1)
Eosinophils Absolute: 0.1 10*3/uL (ref 0.0–0.7)
HCT: 37.9 % — ABNORMAL LOW (ref 39.0–52.0)
Hemoglobin: 12.4 g/dL — ABNORMAL LOW (ref 13.0–17.0)
Lymphs Abs: 1.5 10*3/uL (ref 0.7–4.0)
MCH: 26.3 pg (ref 26.0–34.0)
MCHC: 32.7 g/dL (ref 30.0–36.0)
Monocytes Absolute: 0.3 10*3/uL (ref 0.1–1.0)
Monocytes Relative: 8 % (ref 3–12)
Neutro Abs: 1.7 10*3/uL (ref 1.7–7.7)
RBC: 4.72 MIL/uL (ref 4.22–5.81)

## 2013-04-12 LAB — COMPLETE METABOLIC PANEL WITH GFR
ALT: 20 U/L (ref 0–53)
Alkaline Phosphatase: 77 U/L (ref 39–117)
Sodium: 143 mEq/L (ref 135–145)
Total Bilirubin: 0.3 mg/dL (ref 0.3–1.2)
Total Protein: 8.9 g/dL — ABNORMAL HIGH (ref 6.0–8.3)

## 2013-04-13 LAB — T-HELPER CELL (CD4) - (RCID CLINIC ONLY): CD4 % Helper T Cell: 29 % — ABNORMAL LOW (ref 33–55)

## 2013-04-14 LAB — HIV-1 RNA QUANT-NO REFLEX-BLD: HIV-1 RNA Quant, Log: 1.45 {Log} — ABNORMAL HIGH (ref <1.30)

## 2013-05-04 ENCOUNTER — Ambulatory Visit: Payer: Self-pay | Admitting: Internal Medicine

## 2013-05-16 ENCOUNTER — Ambulatory Visit: Payer: Self-pay

## 2013-05-30 ENCOUNTER — Ambulatory Visit: Payer: Self-pay | Admitting: Internal Medicine

## 2013-06-05 ENCOUNTER — Other Ambulatory Visit: Payer: Self-pay | Admitting: *Deleted

## 2013-06-05 NOTE — Telephone Encounter (Signed)
Pt has refills on both medications.

## 2013-07-11 ENCOUNTER — Ambulatory Visit (INDEPENDENT_AMBULATORY_CARE_PROVIDER_SITE_OTHER): Payer: Self-pay | Admitting: Internal Medicine

## 2013-07-11 ENCOUNTER — Encounter: Payer: Self-pay | Admitting: Internal Medicine

## 2013-07-11 VITALS — BP 163/102 | HR 84 | Temp 98.9°F | Ht 70.0 in | Wt 198.5 lb

## 2013-07-11 DIAGNOSIS — J209 Acute bronchitis, unspecified: Secondary | ICD-10-CM

## 2013-07-11 DIAGNOSIS — Z23 Encounter for immunization: Secondary | ICD-10-CM

## 2013-07-11 DIAGNOSIS — B20 Human immunodeficiency virus [HIV] disease: Secondary | ICD-10-CM

## 2013-07-11 NOTE — Progress Notes (Signed)
  [facility] - Pharmacist    HPI: Clifford Shields is a 27 y.o. male here for a cough.  Allergies: No Known Allergies  Vitals: Temp: 98.9 F (37.2 C) (10/28 1350) Temp src: Oral (10/28 1350) BP: 163/102 mmHg (10/28 1350) Pulse Rate: 84 (10/28 1350)  Past Medical History: Past Medical History  Diagnosis Date  . Hypertension     Social History: History   Social History  . Marital Status: Single    Spouse Name: N/A    Number of Children: N/A  . Years of Education: N/A   Social History Main Topics  . Smoking status: Current Every Day Smoker -- 0.50 packs/day for 15 years  . Smokeless tobacco: Never Used     Comment: not smioking now due to cough  . Alcohol Use: Yes     Comment: rarely /vodka  . Drug Use: 14.00 per week    Special: Marijuana  . Sexual Activity: Yes    Partners: Male    Birth Control/ Protection: Condom     Comment: given condoms   Other Topics Concern  . None   Social History Narrative  . None    Current Regimen: Stribild  Labs: HIV 1 RNA Quant (copies/mL)  Date Value  04/12/2013 28*  02/07/2013 8613*  12/05/2009 666*     CD4 T Cell Abs (cmm)  Date Value  04/12/2013 410   02/07/2013 550   12/05/2009 790      Hep B S Ab (no units)  Date Value  02/07/2013 EQUIVOCAL*     Hepatitis B Surface Ag (no units)  Date Value  02/07/2013 NEGATIVE      HCV Ab (no units)  Date Value  02/07/2013 NEGATIVE     CrCl: Estimated Creatinine Clearance: 122.6 ml/min (by C-G formula based on Cr of 1.04).  Lipids:    Component Value Date/Time   CHOL 133 02/07/2013 1548   TRIG 115 02/07/2013 1548   HDL 34* 02/07/2013 1548   CHOLHDL 3.9 02/07/2013 1548   VLDL 23 02/07/2013 1548   LDLCALC 76 02/07/2013 1548    Assessment: She reports variable adherence with her Stribild, including not having her medication for a period of time, and often forgetting to take it in the mornings before leaving for work.  She also complains of  daytime fatigue which may be attributable to her Amlodipine.   She is still smoking but is down to 1 pack every 2-3 days.  She is not ready to quit completely, and often smokes when bored.  Recommendations: We discussed several strategies to improve compliance.  She would like to try taking her Stribild with her evening meal, and taking her Amlodipine at bedtime. Smoking cessation counseling offered.  She will consider decreasing her cigarette usage gradually.  We brainstormed activities to do when bored instead of smoking - she would like to learn to sew. Will follow-up at her next visit for compliance and viral load response.  Sallee Provencal, Pharm.D., BCPS, AAHIVP Clinical Infectious Disease Pharmacist [facility] 07/11/2013, 2:33 PM

## 2013-07-11 NOTE — Progress Notes (Signed)
Patient ID: Clifford Shields, male   DOB: 08-19-1986, 27 y.o.   MRN: [id number]          [facility] for Infectious Disease  Patient Active Problem List   Diagnosis Date Noted  . Acute bronchitis 07/11/2013  . Tobacco abuse 02/22/2013  . ESSENTIAL HYPERTENSION, BENIGN 12/24/2009  . HIV DISEASE 11/23/2006    Patient's Medications  New Prescriptions   No medications on file  Previous Medications   AMLODIPINE (NORVASC) 5 MG TABLET    Take 1 tablet (5 mg total) by mouth daily.   ELVITEGRAVIR-COBICISTAT-EMTRICITABINE-TENOFOVIR (STRIBILD) 150-150-200-300 MG TABS    Take 1 tablet by mouth daily with breakfast.  Modified Medications   No medications on file  Discontinued Medications   No medications on file    Subjective: Clifford Shields is seen on a walk-in basis. She did not get her ADAP recertified on time and had to stop taking Stribild about 6 weeks ago. She said she restarted it about 3 weeks ago and then one week later got a very bad cold. She thought the cold might have been a reaction to restarting her medications. She had some productive cough, subjective fevers and nasal congestion. She treated herself with over-the-counter medications and felt better but has had some persistent dry cough recently. She is still smoking cigarettes. As I was leaving the room she told me that she has been missing doses of her Stribild frequently when she will forget to take it before she leaves home in the morning. She says she is also currently out of her medication (although that doesn't make much sense and she says she restarted a new supply 3 weeks ago) and has been off of all medicines for the past few days.  Review of Systems: Pertinent items are noted in HPI.  Past Medical History  Diagnosis Date  . Hypertension     History  Substance Use Topics  . Smoking status: Current Every Day Smoker -- 0.50 packs/day for 15 years  . Smokeless tobacco: Never Used     Comment: not smioking now due to  cough  . Alcohol Use: Yes     Comment: rarely Clifford Shields    Family History  Problem Relation Age of Onset  . Kidney disease Mother   . Kidney disease Father     No Known Allergies  Objective: Temp: 98.9 F (37.2 C) (10/28 1350) Temp src: Oral (10/28 1350) BP: 163/102 mmHg (10/28 1350) Pulse Rate: 84 (10/28 1350)  General: She is comfortable and in no distress Oral: Tongue stud. Some pharyngeal erythema without exudates Skin: No rash Lungs: Clear Cor: Regular S1-S2 no murmurs Abdomen: Soft and nontender Joints and extremities: Normal Neuro: Alert with normal speech and conversation Mood and affect: Normal  Lab Results HIV 1 RNA Quant (copies/mL)  Date Value  04/12/2013 28*  02/07/2013 8613*  12/05/2009 666*     CD4 T Cell Abs (cmm)  Date Value  04/12/2013 410   02/07/2013 550   12/05/2009 790      Assessment: Her far load was coming down nicely when she had blood work repeated in late July. However she is having trouble with the adherence. I have spoken to her today about adherence counseling as well as Clifford Shields, our infectious disease pharmacist.  She has some persistent cough after a recent upper respiratory infection. I suggested she use over-the-counter cough suppressants and continue to work on cigarette cessation.  Her blood pressure remains elevated off of her amlodipine.  Plan: 1.  Restart Stribild and amlodipine 2. Adherence counseling provided 3. Cigarette cessation counseling provided and she has been given the number for the [place] quit line 4. Follow up after blood work in 6 weeks   Cliffton Asters, MD [facility] for Infectious Disease [facility] [phone] pager   [phone] cell 07/11/2013, 2:12 PM

## 2013-08-22 ENCOUNTER — Other Ambulatory Visit: Payer: Self-pay

## 2013-09-05 ENCOUNTER — Ambulatory Visit: Payer: Self-pay | Admitting: Internal Medicine

## 2014-01-06 ENCOUNTER — Other Ambulatory Visit: Payer: Self-pay | Admitting: Internal Medicine

## 2014-04-01 ENCOUNTER — Emergency Department (HOSPITAL_COMMUNITY)
Admission: EM | Admit: 2014-04-01 | Discharge: 2014-04-01 | Disposition: A | Discharge status: Home / Self Care | Payer: Self-pay | Attending: Emergency Medicine | Admitting: Emergency Medicine

## 2014-04-01 ENCOUNTER — Encounter (HOSPITAL_COMMUNITY): Payer: Self-pay | Admitting: Emergency Medicine

## 2014-04-01 ENCOUNTER — Emergency Department (HOSPITAL_COMMUNITY): Payer: Self-pay

## 2014-04-01 DIAGNOSIS — S0081XA Abrasion of other part of head, initial encounter: Secondary | ICD-10-CM

## 2014-04-01 DIAGNOSIS — S62319A Displaced fracture of base of unspecified metacarpal bone, initial encounter for closed fracture: Secondary | ICD-10-CM | POA: Insufficient documentation

## 2014-04-01 DIAGNOSIS — IMO0002 Reserved for concepts with insufficient information to code with codable children: Secondary | ICD-10-CM | POA: Insufficient documentation

## 2014-04-01 DIAGNOSIS — S62309A Unspecified fracture of unspecified metacarpal bone, initial encounter for closed fracture: Secondary | ICD-10-CM

## 2014-04-01 DIAGNOSIS — I10 Essential (primary) hypertension: Secondary | ICD-10-CM | POA: Insufficient documentation

## 2014-04-01 DIAGNOSIS — F17200 Nicotine dependence, unspecified, uncomplicated: Secondary | ICD-10-CM | POA: Insufficient documentation

## 2014-04-01 DIAGNOSIS — Z79899 Other long term (current) drug therapy: Secondary | ICD-10-CM | POA: Insufficient documentation

## 2014-04-01 MED ORDER — OXYCODONE-ACETAMINOPHEN 5-325 MG PO TABS: 1.0000 | ORAL_TABLET | Freq: Four times a day (QID) | ORAL | Status: DC | PRN

## 2014-04-01 MED ORDER — HYDROCODONE-ACETAMINOPHEN 5-325 MG PO TABS
2.0000 | ORAL_TABLET | Freq: Once | ORAL | Status: AC
  Administered 2014-04-01: 2 via ORAL
  Filled 2014-04-01: qty 2

## 2014-04-01 NOTE — ED Notes (Signed)
Ortho tech notified for placement of left ulna-gutter splint.
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
## 2014-04-01 NOTE — Progress Notes (Signed)
Orthopedic Tech Progress Note Patient Details:  Clifford Shields 06/27/1986 [id number]  Ortho Devices Type of Ortho Device: Ulna gutter splint Ortho Device/Splint Location: short arm ulna gutter splint is applied the left upper extremity. splint care is gone over verbally with patient. if he has any further questions he will contact the nursing staff. Ortho Device/Splint Interventions: Application   Early CharsBaker,Eran Mistry Anthony 04/01/2014, 4:53 AM

## 2014-04-01 NOTE — ED Notes (Signed)
Patient presents with right swollen hand and a laceration above his right eye.

## 2014-04-01 NOTE — ED Notes (Signed)
Right eye facial wound cleansed, bacitracin applied.
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
## 2014-04-04 ENCOUNTER — Encounter: Payer: Self-pay | Admitting: Infectious Diseases

## 2014-04-04 ENCOUNTER — Ambulatory Visit (INDEPENDENT_AMBULATORY_CARE_PROVIDER_SITE_OTHER): Payer: Self-pay | Admitting: Infectious Diseases

## 2014-04-04 ENCOUNTER — Other Ambulatory Visit: Payer: Self-pay | Admitting: *Deleted

## 2014-04-04 VITALS — BP 157/105 | HR 65 | Temp 98.8°F | Ht 70.0 in | Wt 201.0 lb

## 2014-04-04 DIAGNOSIS — S62609A Fracture of unspecified phalanx of unspecified finger, initial encounter for closed fracture: Secondary | ICD-10-CM | POA: Insufficient documentation

## 2014-04-04 DIAGNOSIS — Z720 Tobacco use: Secondary | ICD-10-CM

## 2014-04-04 DIAGNOSIS — I10 Essential (primary) hypertension: Secondary | ICD-10-CM

## 2014-04-04 DIAGNOSIS — F17200 Nicotine dependence, unspecified, uncomplicated: Secondary | ICD-10-CM

## 2014-04-04 DIAGNOSIS — B20 Human immunodeficiency virus [HIV] disease: Secondary | ICD-10-CM

## 2014-04-04 MED ORDER — AMLODIPINE BESYLATE 5 MG PO TABS: 5.0000 mg | ORAL_TABLET | Freq: Every day | ORAL | Status: DC

## 2014-04-04 MED ORDER — OXYCODONE-ACETAMINOPHEN 5-325 MG PO TABS: 1.0000 | ORAL_TABLET | Freq: Four times a day (QID) | ORAL | Status: DC | PRN

## 2014-04-04 MED ORDER — EMTRICITAB-RILPIVIR-TENOFOV DF 200-25-300 MG PO TABS: 1.0000 | ORAL_TABLET | Freq: Every day | ORAL | Status: DC

## 2014-04-04 NOTE — Assessment & Plan Note (Signed)
Encouraged to quit. 

## 2014-04-04 NOTE — Assessment & Plan Note (Signed)
Will hold on restarting til we find out which rx he was allergic to .
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
## 2014-04-04 NOTE — Assessment & Plan Note (Addendum)
Will get him surgical f/u. Will refil his pain rx.

## 2014-04-04 NOTE — Assessment & Plan Note (Addendum)
Will start him on complera and watch for side effects (INI allergy?). He is given condoms. Will see him back in 1-2 months.

## 2014-04-11 ENCOUNTER — Telehealth: Payer: Self-pay | Admitting: *Deleted

## 2014-04-11 NOTE — Telephone Encounter (Signed)
Requested that the pt call [facility] to let us know how his hand is doing and what his plan for getting it taken care or?

## 2014-04-26 ENCOUNTER — Emergency Department (HOSPITAL_COMMUNITY)
Admission: EM | Admit: 2014-04-26 | Discharge: 2014-04-26 | Disposition: A | Discharge status: Home / Self Care | Payer: Self-pay | Attending: Emergency Medicine | Admitting: Emergency Medicine

## 2014-04-26 ENCOUNTER — Encounter (HOSPITAL_COMMUNITY): Payer: Self-pay | Admitting: Emergency Medicine

## 2014-04-26 DIAGNOSIS — G8911 Acute pain due to trauma: Secondary | ICD-10-CM | POA: Insufficient documentation

## 2014-04-26 DIAGNOSIS — I10 Essential (primary) hypertension: Secondary | ICD-10-CM | POA: Insufficient documentation

## 2014-04-26 DIAGNOSIS — S62309D Unspecified fracture of unspecified metacarpal bone, subsequent encounter for fracture with routine healing: Secondary | ICD-10-CM

## 2014-04-26 DIAGNOSIS — Z21 Asymptomatic human immunodeficiency virus [HIV] infection status: Secondary | ICD-10-CM | POA: Insufficient documentation

## 2014-04-26 DIAGNOSIS — L0231 Cutaneous abscess of buttock: Secondary | ICD-10-CM | POA: Insufficient documentation

## 2014-04-26 DIAGNOSIS — M79609 Pain in unspecified limb: Secondary | ICD-10-CM | POA: Insufficient documentation

## 2014-04-26 DIAGNOSIS — Z79899 Other long term (current) drug therapy: Secondary | ICD-10-CM | POA: Insufficient documentation

## 2014-04-26 DIAGNOSIS — F17200 Nicotine dependence, unspecified, uncomplicated: Secondary | ICD-10-CM | POA: Insufficient documentation

## 2014-04-26 DIAGNOSIS — L03317 Cellulitis of buttock: Principal | ICD-10-CM

## 2014-04-26 MED ORDER — SULFAMETHOXAZOLE-TRIMETHOPRIM 800-160 MG PO TABS: 1.0000 | ORAL_TABLET | Freq: Two times a day (BID) | ORAL | Status: DC

## 2014-04-26 MED ORDER — CEPHALEXIN 500 MG PO CAPS: 500.0000 mg | ORAL_CAPSULE | Freq: Four times a day (QID) | ORAL | Status: DC

## 2014-04-26 MED ORDER — HYDROCODONE-ACETAMINOPHEN 5-325 MG PO TABS: 2.0000 | ORAL_TABLET | ORAL | Status: DC | PRN
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
## 2014-04-26 NOTE — Discharge Instructions (Signed)
Keflex and Bactrim as prescribed.  Hydrocodone as prescribed as needed for pain.  Perform warm soaks multiple times daily for the next several days.  Return to the emergency department if your symptoms substantially worsen or change.  You should have your hands followed up by hand surgery. The contact information has been given for Dr. Melvyn Novasrtmann. Please call his office to arrange a followup appointment in the next few days.   Abscess An abscess is an infected area that contains a collection of pus and debris.It can occur in almost any part of the body. An abscess is also known as a furuncle or boil. CAUSES  An abscess occurs when tissue gets infected. This can occur from blockage of oil or sweat glands, infection of hair follicles, or a minor injury to the skin. As the body tries to fight the infection, pus collects in the area and creates pressure under the skin. This pressure causes pain. People with weakened immune systems have difficulty fighting infections and get certain abscesses more often.  SYMPTOMS Usually an abscess develops on the skin and becomes a painful mass that is red, warm, and tender. If the abscess forms under the skin, you may feel a moveable soft area under the skin. Some abscesses break open (rupture) on their own, but most will continue to get worse without care. The infection can spread deeper into the body and eventually into the bloodstream, causing you to feel ill.  DIAGNOSIS  Your caregiver will take your medical history and perform a physical exam. A sample of fluid may also be taken from the abscess to determine what is causing your infection. TREATMENT  Your caregiver may prescribe antibiotic medicines to fight the infection. However, taking antibiotics alone usually does not cure an abscess. Your caregiver may need to make a small cut (incision) in the abscess to drain the pus. In some cases, gauze is packed into the abscess to reduce pain and to continue draining  the area. HOME CARE INSTRUCTIONS   Only take over-the-counter or prescription medicines for pain, discomfort, or fever as directed by your caregiver.  If you were prescribed antibiotics, take them as directed. Finish them even if you start to feel better.  If gauze is used, follow your caregiver's directions for changing the gauze.  To avoid spreading the infection:  Keep your draining abscess covered with a bandage.  Wash your hands well.  Do not share personal care items, towels, or whirlpools with others.  Avoid skin contact with others.  Keep your skin and clothes clean around the abscess.  Keep all follow-up appointments as directed by your caregiver. SEEK MEDICAL CARE IF:   You have increased pain, swelling, redness, fluid drainage, or bleeding.  You have muscle aches, chills, or a general ill feeling.  You have a fever. MAKE SURE YOU:   Understand these instructions.  Will watch your condition.  Will get help right away if you are not doing well or get worse. Document Released: 06/10/2005 Document Revised: 03/01/2012 Document Reviewed: 11/13/2011 [facility] Patient Information 2015 [place], [place]. This information is not intended to replace advice given to you by your health care provider. Make sure you discuss any questions you have with your health care provider.
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
## 2014-04-30 ENCOUNTER — Telehealth: Payer: Self-pay | Admitting: *Deleted

## 2014-04-30 DIAGNOSIS — B20 Human immunodeficiency virus [HIV] disease: Secondary | ICD-10-CM

## 2014-04-30 MED ORDER — EMTRICITAB-RILPIVIR-TENOFOV DF 200-25-300 MG PO TABS: 1.0000 | ORAL_TABLET | Freq: Every day | ORAL | Status: DC

## 2014-04-30 MED ORDER — SULFAMETHOXAZOLE-TRIMETHOPRIM 800-160 MG PO TABS: 1.0000 | ORAL_TABLET | Freq: Two times a day (BID) | ORAL | Status: DC

## 2014-04-30 NOTE — Telephone Encounter (Signed)
ADAP Application 

## 2014-05-15 ENCOUNTER — Ambulatory Visit: Payer: Self-pay

## 2014-05-23 ENCOUNTER — Other Ambulatory Visit: Payer: Self-pay

## 2014-06-06 ENCOUNTER — Ambulatory Visit: Payer: Self-pay | Admitting: Internal Medicine

## 2014-09-26 ENCOUNTER — Emergency Department (HOSPITAL_COMMUNITY)
Admission: EM | Admit: 2014-09-26 | Discharge: 2014-09-26 | Disposition: A | Discharge status: Home / Self Care | Payer: Self-pay | Attending: Emergency Medicine | Admitting: Emergency Medicine

## 2014-09-26 DIAGNOSIS — S025XXA Fracture of tooth (traumatic), initial encounter for closed fracture: Secondary | ICD-10-CM | POA: Insufficient documentation

## 2014-09-26 DIAGNOSIS — I10 Essential (primary) hypertension: Secondary | ICD-10-CM | POA: Insufficient documentation

## 2014-09-26 DIAGNOSIS — R51 Headache: Secondary | ICD-10-CM | POA: Insufficient documentation

## 2014-09-26 DIAGNOSIS — Y998 Other external cause status: Secondary | ICD-10-CM | POA: Insufficient documentation

## 2014-09-26 DIAGNOSIS — Z792 Long term (current) use of antibiotics: Secondary | ICD-10-CM | POA: Insufficient documentation

## 2014-09-26 DIAGNOSIS — Z79899 Other long term (current) drug therapy: Secondary | ICD-10-CM | POA: Insufficient documentation

## 2014-09-26 DIAGNOSIS — Z720 Tobacco use: Secondary | ICD-10-CM | POA: Insufficient documentation

## 2014-09-26 DIAGNOSIS — Y929 Unspecified place or not applicable: Secondary | ICD-10-CM | POA: Insufficient documentation

## 2014-09-26 DIAGNOSIS — Y939 Activity, unspecified: Secondary | ICD-10-CM | POA: Insufficient documentation

## 2014-09-26 DIAGNOSIS — X58XXXA Exposure to other specified factors, initial encounter: Secondary | ICD-10-CM | POA: Insufficient documentation

## 2014-09-26 DIAGNOSIS — K0889 Other specified disorders of teeth and supporting structures: Secondary | ICD-10-CM

## 2014-09-26 NOTE — ED Notes (Signed)
See Downtime Charting. 

## 2014-09-26 NOTE — ED Provider Notes (Signed)
CSN: [id number]     Arrival date & time 09/26/14  0046 History   First MD Initiated Contact with Patient 09/26/14 0555     No chief complaint on file.    (Consider location/radiation/quality/duration/timing/severity/associated sxs/prior Treatment) HPI Comments: Complaining of constant left, upper dental pain that began today. Patient reports he has a fractured tooth and believes this to be the cause of his pain. There is associated HA. He notes the pain is worse when eating. Patient has tried Orajel and ibuprofen without significant improvement. He denies fever, facial swelling, nausea, or vomiting. Patient is not established with a dentist.  Patient is a 29 y.o. male presenting with tooth pain.  Dental Pain Location:  Upper Upper teeth location:  15/LU 2nd molar Quality:  Dull and pulsating Severity:  Moderate Onset quality:  Gradual Timing:  Constant Progression:  Unchanged Chronicity:  New Context: dental fracture   Relieved by:  Nothing Worsened by:  Cold food/drink, hot food/drink, touching and jaw movement Associated symptoms: headaches   Associated symptoms: no congestion, no difficulty swallowing, no drooling, no fever, no neck pain and no trismus     Past Medical History  Diagnosis Date  . Hypertension    No past surgical history on file. Family History  Problem Relation Age of Onset  . Kidney disease Mother   . Kidney disease Father    History  Substance Use Topics  . Smoking status: Current Every Day Smoker -- 1.00 packs/day for 15 years    Types: Cigarettes  . Smokeless tobacco: Never Used     Comment: not smioking now due to cough  . Alcohol Use: Yes     Comment: rarely [name]    Review of Systems  Constitutional: Negative for fever.  HENT: Negative for congestion and drooling.   Respiratory: Negative for cough and shortness of breath.   Musculoskeletal: Negative for neck pain.  Neurological: Positive for headaches.  All other systems reviewed and are  negative.     Allergies  Review of patient's allergies indicates no known allergies.  Home Medications   Prior to Admission medications   Medication Sig Start Date End Date Taking? Authorizing Provider  amLODipine (NORVASC) 5 MG tablet Take 5 mg by mouth daily.    Historical Provider, MD  cephALEXin (KEFLEX) 500 MG capsule Take 1 capsule (500 mg total) by mouth 4 (four) times daily. 04/26/14   Geoffery Lyons, MD  diphenhydrAMINE (BENADRYL) 25 MG tablet Take 25 mg by mouth every 6 (six) hours as needed for allergies.    Historical Provider, MD  Emtricitab-Rilpivir-Tenofovir 200-25-300 MG TABS Take 1 tablet by mouth daily. 04/30/14   Cliffton Asters, MD  HYDROcodone-acetaminophen (NORCO) 5-325 MG per tablet Take 2 tablets by mouth every 4 (four) hours as needed. 04/26/14   Geoffery Lyons, MD  oxyCODONE-acetaminophen (PERCOCET/ROXICET) 5-325 MG per tablet Take 1-2 tablets by mouth every 6 (six) hours as needed for moderate pain or severe pain. 04/04/14   Ginnie Smart, MD  sulfamethoxazole-trimethoprim (BACTRIM DS,SEPTRA DS) 800-160 MG per tablet Take 1 tablet by mouth 2 (two) times daily. 04/30/14 05/07/14  Cliffton Asters, MD   There were no vitals taken for this visit. Physical Exam  Constitutional: He is oriented to person, place, and time. He appears well-developed and well-nourished. No distress.  HENT:  Head: Normocephalic and atraumatic.  Mouth/Throat: No oropharyngeal exudate.  Eyes: EOM are normal. Pupils are equal, round, and reactive to light.  Neck: Normal range of motion. Neck supple.  Cardiovascular: Normal  rate and regular rhythm.  Exam reveals no friction rub.   No murmur heard. Pulmonary/Chest: Effort normal and breath sounds normal. No respiratory distress. He has no wheezes. He has no rales.  Abdominal: He exhibits no distension. There is no tenderness. There is no rebound.  Musculoskeletal: Normal range of motion. He exhibits no edema.  Neurological: He is alert and oriented  to person, place, and time.  Skin: No rash noted. He is not diaphoretic.  Nursing note and vitals reviewed.   ED Course  Dental Date/Time: 09/26/2014 6:00 AM Performed by: Elwin MochaWALDEN, Bryleigh Ottaway Authorized by: Elwin MochaWALDEN, Leanny Moeckel Consent: Verbal consent obtained. Preparation: Patient was prepped and draped in the usual sterile fashion. Local anesthesia used: yes Anesthesia: local infiltration Local anesthetic: bupivacaine 0.25% without epinephrine Anesthetic total: 1 ml Patient sedated: no Patient tolerance: Patient tolerated the procedure well with no immediate complications   (including critical care time) Labs Review Labs Reviewed - No data to display  Imaging Review No results found.   EKG Interpretation None      MDM   Final diagnoses:  Tooth fracture, closed, initial encounter  Pain, dental    29 year old male here with dental fracture. Pain started today. No fevers, facial swelling, but is having a headache. Left first molar with large fracture. No intraoral abscess. Offered patient a dental block which patient was amenable to. See note above. 27-gauge needle used. Patient reported much pain relief. Given pain meds and penicillin for possible periapical abscess. Instructed to call for dentistry.    Elwin MochaBlair Neilah Fulwider, MD 09/26/14 [phone]

## 2014-10-04 ENCOUNTER — Other Ambulatory Visit (INDEPENDENT_AMBULATORY_CARE_PROVIDER_SITE_OTHER): Payer: Self-pay

## 2014-10-04 DIAGNOSIS — Z113 Encounter for screening for infections with a predominantly sexual mode of transmission: Secondary | ICD-10-CM

## 2014-10-04 DIAGNOSIS — B20 Human immunodeficiency virus [HIV] disease: Secondary | ICD-10-CM

## 2014-10-04 DIAGNOSIS — Z79899 Other long term (current) drug therapy: Secondary | ICD-10-CM

## 2014-10-04 LAB — COMPLETE METABOLIC PANEL WITH GFR
ALT: 16 U/L (ref 0–53)
AST: 20 U/L (ref 0–37)
Albumin: 4.2 g/dL (ref 3.5–5.2)
Alkaline Phosphatase: 76 U/L (ref 39–117)
BUN: 7 mg/dL (ref 6–23)
CHLORIDE: 107 meq/L (ref 96–112)
CO2: 28 meq/L (ref 19–32)
CREATININE: 1.13 mg/dL (ref 0.50–1.35)
Calcium: 9.4 mg/dL (ref 8.4–10.5)
GFR, Est African American: >89 mL/min
GFR, Est Non African American: 89 mL/min
Glucose, Bld: 87 mg/dL (ref 70–99)
POTASSIUM: 3.8 meq/L (ref 3.5–5.3)
Sodium: 141 mEq/L (ref 135–145)
Total Bilirubin: 0.3 mg/dL (ref 0.2–1.2)
Total Protein: 8.2 g/dL (ref 6.0–8.3)

## 2014-10-04 LAB — LIPID PANEL
Cholesterol: 141 mg/dL (ref 0–200)
HDL: 32 mg/dL — ABNORMAL LOW (ref >39)
LDL Cholesterol: 74 mg/dL (ref 0–99)
TRIGLYCERIDES: 173 mg/dL — AB (ref <150)
Total CHOL/HDL Ratio: 4.4 Ratio
VLDL: 35 mg/dL (ref 0–40)

## 2014-10-05 LAB — CBC WITH DIFFERENTIAL/PLATELET
Basophils Absolute: 0 10*3/uL (ref 0.0–0.1)
Basophils Relative: 0 % (ref 0–1)
EOS PCT: 7 % — AB (ref 0–5)
Eosinophils Absolute: 0.4 10*3/uL (ref 0.0–0.7)
HCT: 39.8 % (ref 39.0–52.0)
HEMOGLOBIN: 12.7 g/dL — AB (ref 13.0–17.0)
LYMPHS ABS: 2.7 10*3/uL (ref 0.7–4.0)
LYMPHS PCT: 46 % (ref 12–46)
MCH: 26.2 pg (ref 26.0–34.0)
MCHC: 31.9 g/dL (ref 30.0–36.0)
MCV: 82.2 fL (ref 78.0–100.0)
MONO ABS: 0.3 10*3/uL (ref 0.1–1.0)
MONOS PCT: 5 % (ref 3–12)
MPV: 11.1 fL (ref 8.6–12.4)
Neutro Abs: 2.4 10*3/uL (ref 1.7–7.7)
Neutrophils Relative %: 42 % — ABNORMAL LOW (ref 43–77)
PLATELETS: 229 10*3/uL (ref 150–400)
RBC: 4.84 MIL/uL (ref 4.22–5.81)
RDW: 15 % (ref 11.5–15.5)
WBC: 5.8 10*3/uL (ref 4.0–10.5)

## 2014-10-05 LAB — T-HELPER CELL (CD4) - (RCID CLINIC ONLY)
CD4 % Helper T Cell: 26 % — ABNORMAL LOW (ref 33–55)
CD4 T Cell Abs: 680 /uL (ref 400–2700)

## 2014-10-05 LAB — URINE CYTOLOGY ANCILLARY ONLY
Chlamydia: NEGATIVE
NEISSERIA GONORRHEA: NEGATIVE

## 2014-10-05 LAB — RPR

## 2014-10-08 LAB — HIV-1 RNA ULTRAQUANT REFLEX TO GENTYP+: HIV-1 RNA Quant, Log: <1.3 {Log} (ref <1.30)

## 2014-11-05 ENCOUNTER — Ambulatory Visit: Payer: Self-pay | Admitting: Internal Medicine

## 2014-11-05 ENCOUNTER — Telehealth: Payer: Self-pay | Admitting: *Deleted

## 2014-11-05 NOTE — Telephone Encounter (Signed)
Patient no-showed today's appointment. Patient last seen in office 03/2014. He reports needing to be seen due to "other issues" not his HIV.  RN explained that if he is asking for referrals, he needs an appointment with a physician to address them.  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  Patient requesting call for dental clinic.  RN passed the message to [place] [facility] for more information. Andree CossHowell, Talor Cheema M, RN

## 2014-11-06 NOTE — Telephone Encounter (Signed)
Per Claris CheMargaret, patient has not yet filled out the consent for services.  RN asked patient to come in and fill out consent form for [facility].  Patient will come this morning to sign the consent, will be placed on dental appointment list.

## 2014-11-15 ENCOUNTER — Ambulatory Visit (INDEPENDENT_AMBULATORY_CARE_PROVIDER_SITE_OTHER): Payer: Self-pay | Admitting: Internal Medicine

## 2014-11-15 ENCOUNTER — Encounter: Payer: Self-pay | Admitting: Internal Medicine

## 2014-11-15 VITALS — BP 154/97 | HR 65 | Temp 98.8°F | Ht 70.0 in | Wt 203.0 lb

## 2014-11-15 DIAGNOSIS — B20 Human immunodeficiency virus [HIV] disease: Secondary | ICD-10-CM

## 2014-11-15 DIAGNOSIS — Z23 Encounter for immunization: Secondary | ICD-10-CM

## 2014-11-15 DIAGNOSIS — I10 Essential (primary) hypertension: Secondary | ICD-10-CM

## 2014-11-15 DIAGNOSIS — R0789 Other chest pain: Secondary | ICD-10-CM

## 2014-11-15 DIAGNOSIS — Z720 Tobacco use: Secondary | ICD-10-CM

## 2014-11-15 NOTE — Assessment & Plan Note (Signed)
His blood pressure is elevated today however he has not taken his Norvasc yet today. This will be monitored. We'll also have him referred for a primary physician.

## 2014-11-15 NOTE — Progress Notes (Signed)
   Subjective:    Patient ID: Clifford Shields, male    DOB: 04/05/1986, 29 y.o.   MRN: [id number]  HPI She is here for follow-up of his HIV. He was diagnosed in 2009 and has been out of care for long time. She did come back into care last year and was last seen in July 2015. She started then on Complera and was to return in a month later however did not come back. She though has continued on Complera and her recent labs show good compliance with a CD4 count of 680 an undetectable viral load.    Also has had occasional episodes of sharp pain in his upper chest. [place] resolves on its own.   Also having some dental issues.   Review of Systems  Constitutional: Negative for fatigue.  HENT: Negative for trouble swallowing.   Gastrointestinal: Negative for nausea and diarrhea.  Skin: Negative for rash.  Neurological: Negative for dizziness and light-headedness.       Objective:   Physical Exam  Constitutional: He appears well-developed and well-nourished. No distress.  HENT:  Mouth/Throat: No oropharyngeal exudate.  Eyes: No scleral icterus.  Cardiovascular: Normal rate, regular rhythm and normal heart sounds.   No murmur heard. Pulmonary/Chest: Effort normal and breath sounds normal. No respiratory distress. He has no wheezes.  Lymphadenopathy:    He has no cervical adenopathy.  Skin: No rash noted.          Assessment & Plan:

## 2014-11-15 NOTE — Assessment & Plan Note (Signed)
Pain is sharp in nature and consistent with gas.

## 2014-11-15 NOTE — Assessment & Plan Note (Signed)
He is doing well on Complera and will continue with the same. No issues with the diet. Will return in 4 months with labs prior to visit.

## 2014-11-15 NOTE — Assessment & Plan Note (Signed)
I discussed smoking cessation and he is pre-contemplative. He does understand the importance and is hopeful to be ready incoming appointments.

## 2014-12-13 ENCOUNTER — Encounter: Payer: Self-pay | Admitting: *Deleted

## 2014-12-13 NOTE — Progress Notes (Signed)
Patient ID: Trilby Drummerntoine Reek, male   DOB: 04/10/1986, 29 y.o.   MRN: [id number] Client is enrolled in Medical Case Management with [facility] as of 10/04/14 with Intern Chandra BatchBeth Lowder and [facility].  Client expresses ongoing difficulty with treatment adherence, stating on multiple different occasions either taking it every day or sometimes missing doses.  Client reported at appointment today 12/13/14 that he is regularly taking his HIV medication every other day and has been for an extended period of time!  Case manager continues to discuss the importance of compliance with medication directions and has repeatedly encouraged client to discuss his concerns with side effects with prescribing physician.  Client reports side-effects as the major barrier to treatment adherence with HIV medications.  Client continues to express that having a positive attitude and his mental state is as important as taking medications and may be at risk of non-compliance due to misunderstanding how medication affects his disease.  Case manager continues to provide health education to address this issue.

## 2014-12-18 ENCOUNTER — Ambulatory Visit: Payer: Self-pay

## 2015-02-17 ENCOUNTER — Encounter (HOSPITAL_COMMUNITY): Payer: Self-pay | Admitting: Emergency Medicine

## 2015-02-17 ENCOUNTER — Emergency Department (HOSPITAL_COMMUNITY)
Admission: EM | Admit: 2015-02-17 | Discharge: 2015-02-17 | Disposition: A | Discharge status: Home / Self Care | Payer: Self-pay | Attending: Emergency Medicine | Admitting: Emergency Medicine

## 2015-02-17 DIAGNOSIS — Z79899 Other long term (current) drug therapy: Secondary | ICD-10-CM | POA: Insufficient documentation

## 2015-02-17 DIAGNOSIS — Z21 Asymptomatic human immunodeficiency virus [HIV] infection status: Secondary | ICD-10-CM | POA: Insufficient documentation

## 2015-02-17 DIAGNOSIS — N341 Nonspecific urethritis: Secondary | ICD-10-CM | POA: Insufficient documentation

## 2015-02-17 DIAGNOSIS — Z720 Tobacco use: Secondary | ICD-10-CM | POA: Insufficient documentation

## 2015-02-17 DIAGNOSIS — I10 Essential (primary) hypertension: Secondary | ICD-10-CM | POA: Insufficient documentation

## 2015-02-17 DIAGNOSIS — Z8619 Personal history of other infectious and parasitic diseases: Secondary | ICD-10-CM | POA: Insufficient documentation

## 2015-02-17 LAB — CBC WITH DIFFERENTIAL/PLATELET
BASOS PCT: 0 % (ref 0–1)
Basophils Absolute: 0 10*3/uL (ref 0.0–0.1)
EOS ABS: 0.5 10*3/uL (ref 0.0–0.7)
EOS PCT: 9 % — AB (ref 0–5)
HCT: 37.8 % — ABNORMAL LOW (ref 39.0–52.0)
Hemoglobin: 12.5 g/dL — ABNORMAL LOW (ref 13.0–17.0)
Lymphocytes Relative: 51 % — ABNORMAL HIGH (ref 12–46)
Lymphs Abs: 3.1 10*3/uL (ref 0.7–4.0)
MCH: 26.9 pg (ref 26.0–34.0)
MCHC: 33.1 g/dL (ref 30.0–36.0)
MCV: 81.3 fL (ref 78.0–100.0)
MONO ABS: 0.4 10*3/uL (ref 0.1–1.0)
Monocytes Relative: 6 % (ref 3–12)
Neutro Abs: 2.1 10*3/uL (ref 1.7–7.7)
Neutrophils Relative %: 34 % — ABNORMAL LOW (ref 43–77)
Platelets: 184 10*3/uL (ref 150–400)
RBC: 4.65 MIL/uL (ref 4.22–5.81)
RDW: 13.4 % (ref 11.5–15.5)
WBC: 6.1 10*3/uL (ref 4.0–10.5)

## 2015-02-17 LAB — URINALYSIS, ROUTINE W REFLEX MICROSCOPIC
Bilirubin Urine: NEGATIVE
Glucose, UA: NEGATIVE mg/dL
Hgb urine dipstick: NEGATIVE
Ketones, ur: NEGATIVE mg/dL
Nitrite: NEGATIVE
Protein, ur: NEGATIVE mg/dL
Specific Gravity, Urine: 1.03 (ref 1.005–1.030)
Urobilinogen, UA: 0.2 mg/dL (ref 0.0–1.0)
pH: 5.5 (ref 5.0–8.0)

## 2015-02-17 LAB — BASIC METABOLIC PANEL
Anion gap: 8 (ref 5–15)
BUN: 8 mg/dL (ref 6–20)
CHLORIDE: 107 mmol/L (ref 101–111)
CO2: 26 mmol/L (ref 22–32)
Calcium: 9.1 mg/dL (ref 8.9–10.3)
Creatinine, Ser: 1.08 mg/dL (ref 0.61–1.24)
GFR calc Af Amer: >60 mL/min (ref >60)
GLUCOSE: 100 mg/dL — AB (ref 65–99)
Potassium: 4.3 mmol/L (ref 3.5–5.1)
Sodium: 141 mmol/L (ref 135–145)

## 2015-02-17 LAB — RPR: RPR Ser Ql: NONREACTIVE

## 2015-02-17 LAB — URINE MICROSCOPIC-ADD ON

## 2015-02-17 MED ORDER — AZITHROMYCIN 250 MG PO TABS
1000.0000 mg | ORAL_TABLET | Freq: Once | ORAL | Status: AC
  Administered 2015-02-17: 1000 mg via ORAL
  Filled 2015-02-17: qty 4

## 2015-02-17 MED ORDER — LIDOCAINE HCL (PF) 1 % IJ SOLN
INTRAMUSCULAR | Status: AC
  Administered 2015-02-17: 1 mL
  Filled 2015-02-17: qty 5

## 2015-02-17 MED ORDER — CEFTRIAXONE SODIUM 250 MG IJ SOLR
250.0000 mg | Freq: Once | INTRAMUSCULAR | Status: AC
  Administered 2015-02-17: 250 mg via INTRAMUSCULAR
  Filled 2015-02-17: qty 250

## 2015-02-17 NOTE — ED Notes (Signed)
Patient here with complaint abdominal pain and penile discharge starting 2 days ago. Pain is RLQ. Possible exposure to STD.

## 2015-02-17 NOTE — Discharge Instructions (Signed)
You were tested and treated for gonorrhea, chlamydia today.  You were tested for syphilis.  You'll be contacted if it is positive.  Please return to the emergency department, if you have worsening abdominal pain, vomiting, fever or other new concerning symptoms.    Urethritis Urethritis is an inflammation of the tube through which urine exits your bladder (urethra).  CAUSES Urethritis is often caused by an infection in your urethra. The infection can be viral, like herpes. The infection can also be bacterial, like gonorrhea. RISK FACTORS Risk factors of urethritis include:  Having sex without using a condom.  Having multiple sexual partners.  Having poor hygiene. SIGNS AND SYMPTOMS Symptoms of urethritis are less noticeable in women than in men. These symptoms include:  Burning feeling when you urinate (dysuria).  Discharge from your urethra.  Blood in your urine (hematuria).  Urinating more than usual. DIAGNOSIS  To confirm a diagnosis of urethritis, your health care provider will do the following:  Ask about your sexual history.  Perform a physical exam.  Have you provide a sample of your urine for lab testing.  Use a cotton swab to gently collect a sample from your urethra for lab testing. TREATMENT  It is important to treat urethritis. Depending on the cause, untreated urethritis may lead to serious genital infections and possibly infertility. Urethritis caused by a bacterial infection is treated with antibiotic medicine. All sexual partners must be treated.  HOME CARE INSTRUCTIONS  Do not have sex until the test results are known and treatment is completed, even if your symptoms go away before you finish treatment.  If you were prescribed an antibiotic, finish it all even if you start to feel better. SEEK MEDICAL CARE IF:   Your symptoms are not improved in 3 days.  Your symptoms are getting worse.  You develop abdominal pain or pelvic pain (in women).  You  develop joint pain.  You have a fever. SEEK IMMEDIATE MEDICAL CARE IF:   You have severe pain in the belly, back, or side.  You have repeated vomiting. MAKE SURE YOU:  Understand these instructions.  Will watch your condition.  Will get help right away if you are not doing well or get worse. Document Released: 02/24/2001 Document Revised: 01/15/2014 Document Reviewed: 05/01/2013 [facility] Patient Information 2015 [employer] [place]. This information is not intended to replace advice given to you by your health care provider. Make sure you discuss any questions you have with your health care provider.

## 2015-02-17 NOTE — ED Notes (Signed)
Phlebotomy at bedside.

## 2015-02-17 NOTE — ED Provider Notes (Signed)
CSN: [id number]     Arrival date & time 02/17/15  0406 History   First MD Initiated Contact with Patient 02/17/15 [phone]     Chief Complaint  Patient presents with  . Abdominal Pain  . Penile Discharge  . Testicle Pain     (Consider location/radiation/quality/duration/timing/severity/associated sxs/prior Treatment) HPI 29 year old male presents to emergency department with complaint of 2 days of penile discharge.  Last sexual contact was with his long-term partner, but he suspects that his partner has been cheating.  Patient has history of hypertension and HIV disease.  He reports that he is compliant with his medications.  Patient reports in the past, he lived a wilder lifestyle and had gonorrhea.  He denies severe pain, as he has had in the past with gonorrhea, but is starting to have some dysuria.  He also complains of some mild right lower quadrant pain.  No fevers, chills, nausea, vomiting, diarrhea. Past Medical History  Diagnosis Date  . Hypertension    History reviewed. No pertinent past surgical history. Family History  Problem Relation Age of Onset  . Kidney disease Mother   . Kidney disease Father    History  Substance Use Topics  . Smoking status: Current Every Day Smoker -- 1.00 packs/day for 15 years    Types: Cigarettes  . Smokeless tobacco: Never Used     Comment: not smioking now due to cough  . Alcohol Use: 0.0 oz/week    0 Standard drinks or equivalent per week     Comment: rarely [name]/vodka    Review of Systems  See History of Present Illness; otherwise all other systems are reviewed and negative   Allergies  Review of patient's allergies indicates no known allergies.  Home Medications   Prior to Admission medications   Medication Sig Start Date End Date Taking? Authorizing Provider  amLODipine (NORVASC) 5 MG tablet Take 5 mg by mouth daily.   Yes Historical Provider, MD  Emtricitab-Rilpivir-Tenofovir 200-25-300 MG TABS Take 1 tablet by mouth daily. 04/30/14   Yes Cliffton AstersJohn Campbell, MD  ibuprofen (ADVIL,MOTRIN) 400 MG tablet Take 400 mg by mouth every 6 (six) hours as needed for mild pain.   Yes Historical Provider, MD   BP 139/89 mmHg  Pulse 63  Temp(Src) 97.3 F (36.3 C) (Oral)  Resp 14  Ht 5\' 10"  (1.778 m)  Wt 185 lb (83.915 kg)  BMI 26.54 kg/m2  SpO2 100% Physical Exam  Abdominal: There is tenderness (patient has mild right lower quadrant pain without rebound or guarding).  Genitourinary: Penile tenderness present.  Patient has yellow white discharge at  penis/urethra  Nursing note and vitals reviewed.   ED Course  Procedures (including critical care time) Labs Review Labs Reviewed  URINALYSIS, ROUTINE W REFLEX MICROSCOPIC (NOT AT [facility]) - Abnormal; Notable for the following:    Leukocytes, UA SMALL (*)    All other components within normal limits  CBC WITH DIFFERENTIAL/PLATELET - Abnormal; Notable for the following:    Hemoglobin 12.5 (*)    HCT 37.8 (*)    Neutrophils Relative % 34 (*)    Lymphocytes Relative 51 (*)    Eosinophils Relative 9 (*)    All other components within normal limits  BASIC METABOLIC PANEL - Abnormal; Notable for the following:    Glucose, Bld 100 (*)    All other components within normal limits  URINE MICROSCOPIC-ADD ON - Abnormal; Notable for the following:    Squamous Epithelial / LPF FEW (*)    Bacteria,  UA FEW (*)    Crystals CA OXALATE CRYSTALS (*)    All other components within normal limits  RPR  HIV ANTIBODY (ROUTINE TESTING)  GC/CHLAMYDIA PROBE AMP ([place]) NOT AT [facility]    Imaging Review No results found.   EKG Interpretation None      MDM   Final diagnoses:  Urethritis, nonspecific    29 year old male with 2-3 days of penile discharge, dysuria.  He has mild right lower quadrant pain.  Patient is HIV positive.  Suspect urethritis.  Plan for treatment for gonorrhea and Chlamydia.  Penile swab sent down.  Given abdominal pain.  Will check labs.   Patient reexamined,  abdominal pain has resolved.  Patient has good follow-up with ID clinic.  HIV test.   erroneously sent, will be positive.    Marisa Severin, MD 02/17/15 [phone]

## 2015-02-18 LAB — HIV 1/2 AB DIFFERENTIATION
HIV 1 AB: REACTIVE — AB
HIV 2 Ab: NONREACTIVE

## 2015-02-18 LAB — GC/CHLAMYDIA PROBE AMP (~~LOC~~) NOT AT ARMC
Chlamydia: NEGATIVE
Neisseria Gonorrhea: POSITIVE — AB

## 2015-02-18 LAB — HIV ANTIBODY (ROUTINE TESTING W REFLEX): HIV Screen 4th Generation wRfx: REACTIVE — AB

## 2015-02-19 ENCOUNTER — Telehealth (HOSPITAL_COMMUNITY): Payer: Self-pay

## 2015-02-19 NOTE — ED Notes (Signed)
Positive for gonorrhea. Treated per protocol. [facility] faxed. Attempting to contact pt.  

## 2015-02-22 ENCOUNTER — Telehealth: Payer: Self-pay | Admitting: *Deleted

## 2015-03-05 ENCOUNTER — Other Ambulatory Visit: Payer: Self-pay

## 2015-03-05 NOTE — Progress Notes (Signed)
Patient ID: Clifford Shields, male   DOB: 10-27-1985, 29 y.o.   MRN: [id number] Case Manager Baird Lyons with [facility] discharged client from Medical Case Management program because client did not follow up/ unable to locate.

## 2015-03-17 ENCOUNTER — Emergency Department (HOSPITAL_COMMUNITY)
Admission: EM | Admit: 2015-03-17 | Discharge: 2015-03-17 | Disposition: A | Discharge status: Home / Self Care | Payer: Self-pay | Attending: Emergency Medicine | Admitting: Emergency Medicine

## 2015-03-17 ENCOUNTER — Emergency Department (HOSPITAL_COMMUNITY): Payer: Self-pay

## 2015-03-17 ENCOUNTER — Encounter (HOSPITAL_COMMUNITY): Payer: Self-pay | Admitting: Nurse Practitioner

## 2015-03-17 DIAGNOSIS — Z720 Tobacco use: Secondary | ICD-10-CM | POA: Insufficient documentation

## 2015-03-17 DIAGNOSIS — W1839XA Other fall on same level, initial encounter: Secondary | ICD-10-CM | POA: Insufficient documentation

## 2015-03-17 DIAGNOSIS — Y998 Other external cause status: Secondary | ICD-10-CM | POA: Insufficient documentation

## 2015-03-17 DIAGNOSIS — S82452A Displaced comminuted fracture of shaft of left fibula, initial encounter for closed fracture: Secondary | ICD-10-CM | POA: Insufficient documentation

## 2015-03-17 DIAGNOSIS — Z79899 Other long term (current) drug therapy: Secondary | ICD-10-CM | POA: Insufficient documentation

## 2015-03-17 DIAGNOSIS — I10 Essential (primary) hypertension: Secondary | ICD-10-CM | POA: Insufficient documentation

## 2015-03-17 DIAGNOSIS — Y9259 Other trade areas as the place of occurrence of the external cause: Secondary | ICD-10-CM | POA: Insufficient documentation

## 2015-03-17 DIAGNOSIS — S82202A Unspecified fracture of shaft of left tibia, initial encounter for closed fracture: Secondary | ICD-10-CM

## 2015-03-17 DIAGNOSIS — S82402A Unspecified fracture of shaft of left fibula, initial encounter for closed fracture: Secondary | ICD-10-CM

## 2015-03-17 DIAGNOSIS — Y9302 Activity, running: Secondary | ICD-10-CM | POA: Insufficient documentation

## 2015-03-17 DIAGNOSIS — S8255XA Nondisplaced fracture of medial malleolus of left tibia, initial encounter for closed fracture: Secondary | ICD-10-CM | POA: Insufficient documentation

## 2015-03-17 MED ORDER — OXYCODONE-ACETAMINOPHEN 5-325 MG PO TABS: 2.0000 | ORAL_TABLET | ORAL | Status: DC | PRN

## 2015-03-17 MED ORDER — HYDROMORPHONE HCL 1 MG/ML IJ SOLN
1.0000 mg | Freq: Once | INTRAMUSCULAR | Status: AC
  Administered 2015-03-17: 1 mg via INTRAMUSCULAR
  Filled 2015-03-17: qty 1

## 2015-03-17 MED ORDER — OXYCODONE-ACETAMINOPHEN 5-325 MG PO TABS
2.0000 | ORAL_TABLET | Freq: Once | ORAL | Status: AC
  Administered 2015-03-17: 2 via ORAL
  Filled 2015-03-17: qty 2

## 2015-03-17 NOTE — Discharge Instructions (Signed)
Fibular Fracture, Ankle, Adult, Undisplaced, Treated With Immobilization Follow-up with Myrene Galas on Wednesday, 03/20/2015 for surgical consult. Take Percocet as needed for pain. A simple fracture of the bone below the knee on the outside of your leg (fibula) usually heals without problems. CAUSES Typically, a fibular fracture occurs as a result of trauma. A blow to the side of your leg or a powerful twisting movement can cause a fracture. Fibular fractures are often seen as a result of football, soccer, or skiing injuries. SYMPTOMS Symptoms of a fibular fracture can include:  Pain.  Shortening or abnormal alignment of your lower leg (angulation). DIAGNOSIS A health care provider will need to examine the leg. X-ray exams will be ordered for further to confirm the fracture and evaluate the extent and of the injury. TREATMENT  Typically, a cast or immobilizer is applied. Sometimes a splint is placed on these fractures if it is needed for comfort or if the bones are badly out of place. Crutches may be needed to help you get around.  HOME CARE INSTRUCTIONS   Apply ice to the injured area:  Put ice in a plastic bag.  Place a towel between your skin and the bag.  Leave the ice on for 20 minutes, 2-3 times a day.  Use crutches as directed. Resume walking without crutches as directed by your health care provider or when comfortable doing so.  Only take over-the-counter or prescription medicines for pain, discomfort, or fever as directed by your health care provider.  Keeping your leg raised may lessen swelling.  If you have a removable splint or boot, do not remove the boot unless directed by your health care provider.  Do not not drive a car or operate a motor vehicle until your health care provider specifically tells you it is safe to do so. SEEK IMMEDIATE MEDICAL CARE IF:   Your cast gets damaged or breaks.  You have continued severe pain or more swelling than you did before the  cast was put on, or the pain is not controlled with medications.  Your skin or nails below the injury turn blue or grey, or feel cold or numb.  There is a bad smell or pus coming from under the cast.  You develop severe pain in ankle or foot. MAKE SURE YOU:   Understand these instructions. Tibial and Fibular Fracture, Adult Tibial and fibular fracture is a break in the bones of your lower leg (tibia and fibula). The tibia is the larger of these two bones. The fibula is the smaller of the two bones. It is on the outer side of your leg.  CAUSES Low-energy injuries, such as a fall from ground level. High-energy injuries, such as motor vehicle injuries, gunshot wounds, or high-speed sports collisions. RISK FACTORS Jumping activities. Repetitive stress, such as long-distance running. Participation in sports. Osteoporosis. Advanced age. SIGNS AND SYMPTOMS Pain. Swelling. Inability to put weight on your injured leg. Bone deformities at the site of your injury. Bruising. DIAGNOSIS  Tibial and fibular fractures are diagnosed with the use of X-ray exams. TREATMENT  If you have a simple fracture of these two bones, they can be treated with simple immobilization. A cast or splint will be used on your leg to keep it from moving while it heals. Then you can begin range-of-motion exercises to regain your knee motion. HOME CARE INSTRUCTIONS  Apply ice to your leg: Put ice in a plastic bag. Place a towel between your skin and the bag. Leave the ice  on for 20 minutes, 2-3 times a day. If you have a plaster or fiberglass cast: Do not try to scratch the skin under the cast using sharp or pointed objects. Check the skin around the cast every day. You may put lotion on any red or sore areas. Keep your cast dry and clean. If you have a plaster splint: Wear the splint as directed. You may loosen the elastic around the splint if your toes become numb, tingle, or turn cold or blue. Do not put  pressure on any part of your cast or splint until it is fully hardened, because it may deform. Your cast or splint can be protected during bathing with a plastic bag. Do not lower the cast or splint into water. Use crutches as directed. Only take over-the-counter or prescription medicines for pain, discomfort, or fever as directed by your health care provider. Follow all instructions given to you by your health care provider. Make and keep all follow-up appointments. SEEK MEDICAL CARE IF: Your pain is becoming worse rather than better or is not controlled with medicines. You have increased swelling or redness in the foot. You begin to lose feeling in your foot or toes. SEEK IMMEDIATE MEDICAL CARE IF: You develop a cold or blue foot or toes on the injured side. You develop severe pain in your injured leg, especially if the pain is increased with movement of your toes. MAKE SURE YOU: Understand these instructions. Will watch your condition. Will get help right away if you are not doing well or get worse. Document Released: 05/23/2002 Document Revised: 06/21/2013 Document Reviewed: 04/12/2013 [facility] Patient Information 2015 [place], [place]. This information is not intended to replace advice given to you by your health care provider. Make sure you discuss any questions you have with your health care provider.   Will watch your condition.  Will get help right away if you are not doing well or get worse. Document Released: 05/23/2002 Document Revised: 06/21/2013 Document Reviewed: 04/12/2013 [facility] Patient Information 2015 [place], [place]. This information is not intended to replace advice given to you by your health care provider. Make sure you discuss any questions you have with your health care provider.

## 2015-03-17 NOTE — ED Notes (Signed)
Patient transported to X-ray 
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
## 2015-03-19 ENCOUNTER — Ambulatory Visit: Payer: Self-pay | Admitting: Internal Medicine

## 2015-03-27 ENCOUNTER — Encounter: Payer: Self-pay | Admitting: Orthopedic Surgery

## 2015-03-27 MED ORDER — LACTATED RINGERS IV SOLN: INTRAVENOUS | Status: DC

## 2015-03-27 MED ORDER — CEFAZOLIN SODIUM-DEXTROSE 2-3 GM-% IV SOLR: 2.0000 g | INTRAVENOUS | Status: DC

## 2015-03-27 NOTE — Progress Notes (Signed)
Several unsuccessful attempts were made to contact pt; lvm with pre-op instructions on pt phone according to pre-op assessment call checklist. Pt made aware to stop taking  Aspirin, otc vitamins and herbal medications. Do not take any NSAIDs ie: Ibuprofen, Advil, Naproxen or any medication containing Aspirin.

## 2015-03-28 ENCOUNTER — Inpatient Hospital Stay (HOSPITAL_COMMUNITY): Payer: Self-pay | Admitting: Certified Registered"

## 2015-03-28 ENCOUNTER — Ambulatory Visit (HOSPITAL_COMMUNITY): Payer: Self-pay

## 2015-03-28 ENCOUNTER — Encounter (HOSPITAL_COMMUNITY)
Admission: RE | Disposition: A | Discharge status: Home / Self Care | Payer: Self-pay | Source: Ambulatory Visit | Attending: Orthopedic Surgery

## 2015-03-28 ENCOUNTER — Ambulatory Visit (HOSPITAL_COMMUNITY)
Admission: RE | Admit: 2015-03-28 | Discharge: 2015-03-29 | Disposition: A | Discharge status: Home / Self Care | Payer: Self-pay | Source: Ambulatory Visit | Attending: Orthopedic Surgery | Admitting: Orthopedic Surgery

## 2015-03-28 ENCOUNTER — Encounter (HOSPITAL_COMMUNITY): Payer: Self-pay | Admitting: Surgery

## 2015-03-28 ENCOUNTER — Inpatient Hospital Stay (HOSPITAL_COMMUNITY): Payer: Self-pay

## 2015-03-28 DIAGNOSIS — B20 Human immunodeficiency virus [HIV] disease: Secondary | ICD-10-CM | POA: Insufficient documentation

## 2015-03-28 DIAGNOSIS — D62 Acute posthemorrhagic anemia: Secondary | ICD-10-CM | POA: Insufficient documentation

## 2015-03-28 DIAGNOSIS — E291 Testicular hypofunction: Secondary | ICD-10-CM | POA: Insufficient documentation

## 2015-03-28 DIAGNOSIS — Z720 Tobacco use: Secondary | ICD-10-CM | POA: Diagnosis present

## 2015-03-28 DIAGNOSIS — E349 Endocrine disorder, unspecified: Secondary | ICD-10-CM | POA: Diagnosis present

## 2015-03-28 DIAGNOSIS — S82143A Displaced bicondylar fracture of unspecified tibia, initial encounter for closed fracture: Secondary | ICD-10-CM

## 2015-03-28 DIAGNOSIS — I10 Essential (primary) hypertension: Secondary | ICD-10-CM | POA: Insufficient documentation

## 2015-03-28 DIAGNOSIS — E559 Vitamin D deficiency, unspecified: Secondary | ICD-10-CM | POA: Insufficient documentation

## 2015-03-28 DIAGNOSIS — F17210 Nicotine dependence, cigarettes, uncomplicated: Secondary | ICD-10-CM | POA: Insufficient documentation

## 2015-03-28 DIAGNOSIS — S82832A Other fracture of upper and lower end of left fibula, initial encounter for closed fracture: Secondary | ICD-10-CM | POA: Insufficient documentation

## 2015-03-28 DIAGNOSIS — Z419 Encounter for procedure for purposes other than remedying health state, unspecified: Secondary | ICD-10-CM

## 2015-03-28 DIAGNOSIS — W1830XA Fall on same level, unspecified, initial encounter: Secondary | ICD-10-CM | POA: Insufficient documentation

## 2015-03-28 DIAGNOSIS — Y929 Unspecified place or not applicable: Secondary | ICD-10-CM | POA: Insufficient documentation

## 2015-03-28 DIAGNOSIS — S82142A Displaced bicondylar fracture of left tibia, initial encounter for closed fracture: Principal | ICD-10-CM | POA: Insufficient documentation

## 2015-03-28 DIAGNOSIS — F1290 Cannabis use, unspecified, uncomplicated: Secondary | ICD-10-CM | POA: Insufficient documentation

## 2015-03-28 DIAGNOSIS — F1210 Cannabis abuse, uncomplicated: Secondary | ICD-10-CM | POA: Diagnosis present

## 2015-03-28 HISTORY — DX: Cannabis use, unspecified, uncomplicated: F12.90

## 2015-03-28 HISTORY — PX: ORIF TIBIA PLATEAU: SHX2132

## 2015-03-28 HISTORY — DX: Displaced bicondylar fracture of unspecified tibia, initial encounter for closed fracture: S82.143A

## 2015-03-28 LAB — CBC WITH DIFFERENTIAL/PLATELET
BASOS ABS: 0 10*3/uL (ref 0.0–0.1)
BASOS PCT: 0 % (ref 0–1)
EOS ABS: 0.4 10*3/uL (ref 0.0–0.7)
Eosinophils Relative: 6 % — ABNORMAL HIGH (ref 0–5)
HCT: 37.3 % — ABNORMAL LOW (ref 39.0–52.0)
HEMOGLOBIN: 12.2 g/dL — AB (ref 13.0–17.0)
LYMPHS ABS: 2 10*3/uL (ref 0.7–4.0)
LYMPHS PCT: 30 % (ref 12–46)
MCH: 26.8 pg (ref 26.0–34.0)
MCHC: 32.7 g/dL (ref 30.0–36.0)
MCV: 81.8 fL (ref 78.0–100.0)
MONO ABS: 0.4 10*3/uL (ref 0.1–1.0)
Monocytes Relative: 6 % (ref 3–12)
NEUTROS ABS: 3.7 10*3/uL (ref 1.7–7.7)
Neutrophils Relative %: 58 % (ref 43–77)
PLATELETS: 353 10*3/uL (ref 150–400)
RBC: 4.56 MIL/uL (ref 4.22–5.81)
RDW: 13.1 % (ref 11.5–15.5)
WBC: 6.4 10*3/uL (ref 4.0–10.5)

## 2015-03-28 LAB — COMPREHENSIVE METABOLIC PANEL
ALBUMIN: 3.8 g/dL (ref 3.5–5.0)
ALK PHOS: 67 U/L (ref 38–126)
ALT: 18 U/L (ref 17–63)
ANION GAP: 8 (ref 5–15)
AST: 28 U/L (ref 15–41)
BILIRUBIN TOTAL: 0.7 mg/dL (ref 0.3–1.2)
BUN: 14 mg/dL (ref 6–20)
CALCIUM: 10 mg/dL (ref 8.9–10.3)
CO2: 26 mmol/L (ref 22–32)
CREATININE: 1.04 mg/dL (ref 0.61–1.24)
Chloride: 108 mmol/L (ref 101–111)
GFR calc non Af Amer: >60 mL/min (ref >60)
Glucose, Bld: 99 mg/dL (ref 65–99)
Potassium: 3.9 mmol/L (ref 3.5–5.1)
Sodium: 142 mmol/L (ref 135–145)
TOTAL PROTEIN: 8.1 g/dL (ref 6.5–8.1)

## 2015-03-28 LAB — URINALYSIS, ROUTINE W REFLEX MICROSCOPIC
Bilirubin Urine: NEGATIVE
Glucose, UA: NEGATIVE mg/dL
KETONES UR: NEGATIVE mg/dL
LEUKOCYTES UA: NEGATIVE
Nitrite: NEGATIVE
PH: 7 (ref 5.0–8.0)
PROTEIN: 100 mg/dL — AB
Specific Gravity, Urine: 1.038 — ABNORMAL HIGH (ref 1.005–1.030)
Urobilinogen, UA: 1 mg/dL (ref 0.0–1.0)

## 2015-03-28 LAB — PROTIME-INR
INR: 1.1 (ref 0.00–1.49)
PROTHROMBIN TIME: 14.4 s (ref 11.6–15.2)

## 2015-03-28 LAB — PHOSPHORUS: PHOSPHORUS: 2.8 mg/dL (ref 2.5–4.6)

## 2015-03-28 LAB — APTT: aPTT: 28 seconds (ref 24–37)

## 2015-03-28 LAB — URINE MICROSCOPIC-ADD ON

## 2015-03-28 LAB — PREALBUMIN: Prealbumin: 31.3 mg/dL (ref 18–38)

## 2015-03-28 LAB — MAGNESIUM: Magnesium: 2.2 mg/dL (ref 1.7–2.4)

## 2015-03-28 LAB — TSH: TSH: 1.578 u[IU]/mL (ref 0.350–4.500)

## 2015-03-28 SURGERY — OPEN REDUCTION INTERNAL FIXATION (ORIF) TIBIAL PLATEAU
Anesthesia: General | Site: Knee | Laterality: Left

## 2015-03-28 MED ORDER — ROCURONIUM BROMIDE 100 MG/10ML IV SOLN: INTRAVENOUS | Status: DC | PRN

## 2015-03-28 MED ORDER — ONDANSETRON HCL 4 MG/2ML IJ SOLN: 4.0000 mg | Freq: Four times a day (QID) | INTRAMUSCULAR | Status: DC | PRN

## 2015-03-28 MED ORDER — METOCLOPRAMIDE HCL 5 MG/ML IJ SOLN: 5.0000 mg | Freq: Three times a day (TID) | INTRAMUSCULAR | Status: DC | PRN

## 2015-03-28 MED ORDER — EMTRICITABINE-TENOFOVIR DF 200-300 MG PO TABS
1.0000 | ORAL_TABLET | Freq: Every day | ORAL | Status: DC
  Administered 2015-03-28 – 2015-03-29 (×2): 1 via ORAL
  Filled 2015-03-28 (×3): qty 1

## 2015-03-28 MED ORDER — LIDOCAINE HCL (CARDIAC) 20 MG/ML IV SOLN
INTRAVENOUS | Status: DC | PRN
  Administered 2015-03-28: 50 mg via INTRAVENOUS

## 2015-03-28 MED ORDER — HYDROMORPHONE HCL 1 MG/ML IJ SOLN
INTRAMUSCULAR | Status: AC
  Filled 2015-03-28: qty 1

## 2015-03-28 MED ORDER — PHENYLEPHRINE 40 MCG/ML (10ML) SYRINGE FOR IV PUSH (FOR BLOOD PRESSURE SUPPORT)
PREFILLED_SYRINGE | INTRAVENOUS | Status: AC
Start: 2015-03-28 — End: 2015-03-28
  Filled 2015-03-28: qty 10

## 2015-03-28 MED ORDER — OXYCODONE-ACETAMINOPHEN 5-325 MG PO TABS
1.0000 | ORAL_TABLET | Freq: Four times a day (QID) | ORAL | Status: DC | PRN
  Administered 2015-03-28 – 2015-03-29 (×3): 2 via ORAL
  Filled 2015-03-28 (×2): qty 2

## 2015-03-28 MED ORDER — LIDOCAINE HCL (CARDIAC) 20 MG/ML IV SOLN
INTRAVENOUS | Status: AC
  Filled 2015-03-28: qty 5

## 2015-03-28 MED ORDER — METOCLOPRAMIDE HCL 5 MG PO TABS: 5.0000 mg | ORAL_TABLET | Freq: Three times a day (TID) | ORAL | Status: DC | PRN

## 2015-03-28 MED ORDER — ROCURONIUM BROMIDE 50 MG/5ML IV SOLN
INTRAVENOUS | Status: AC
  Filled 2015-03-28: qty 1

## 2015-03-28 MED ORDER — HYDROMORPHONE HCL 1 MG/ML IJ SOLN
0.5000 mg | INTRAMUSCULAR | Status: DC | PRN
  Administered 2015-03-29 (×3): 1 mg via INTRAVENOUS
  Filled 2015-03-28 (×3): qty 1

## 2015-03-28 MED ORDER — OXYCODONE HCL 5 MG PO TABS
5.0000 mg | ORAL_TABLET | ORAL | Status: DC | PRN
  Administered 2015-03-28: 10 mg via ORAL
  Administered 2015-03-29 (×3): 15 mg via ORAL
  Filled 2015-03-28 (×3): qty 3
  Filled 2015-03-28: qty 2

## 2015-03-28 MED ORDER — METHOCARBAMOL 1000 MG/10ML IJ SOLN
1000.0000 mg | Freq: Four times a day (QID) | INTRAMUSCULAR | Status: DC | PRN
  Filled 2015-03-28: qty 10

## 2015-03-28 MED ORDER — EMTRICITAB-RILPIVIR-TENOFOV DF 200-25-300 MG PO TABS: 1.0000 | ORAL_TABLET | Freq: Every day | ORAL | Status: DC

## 2015-03-28 MED ORDER — ONDANSETRON HCL 4 MG/2ML IJ SOLN
INTRAMUSCULAR | Status: AC
  Filled 2015-03-28: qty 2

## 2015-03-28 MED ORDER — ONDANSETRON HCL 4 MG/2ML IJ SOLN
4.0000 mg | Freq: Four times a day (QID) | INTRAMUSCULAR | Status: DC | PRN
  Administered 2015-03-29 (×2): 4 mg via INTRAVENOUS
  Filled 2015-03-28 (×2): qty 2

## 2015-03-28 MED ORDER — FENTANYL CITRATE (PF) 250 MCG/5ML IJ SOLN
INTRAMUSCULAR | Status: AC
  Filled 2015-03-28: qty 5

## 2015-03-28 MED ORDER — AMLODIPINE BESYLATE 5 MG PO TABS
5.0000 mg | ORAL_TABLET | Freq: Every day | ORAL | Status: DC
  Administered 2015-03-28 – 2015-03-29 (×2): 5 mg via ORAL
  Filled 2015-03-28 (×2): qty 1

## 2015-03-28 MED ORDER — CEFAZOLIN SODIUM 1-5 GM-% IV SOLN
1.0000 g | Freq: Four times a day (QID) | INTRAVENOUS | Status: AC
  Administered 2015-03-28 – 2015-03-29 (×3): 1 g via INTRAVENOUS
  Filled 2015-03-28 (×4): qty 50

## 2015-03-28 MED ORDER — ROCURONIUM BROMIDE 100 MG/10ML IV SOLN
INTRAVENOUS | Status: DC | PRN
  Administered 2015-03-28: 50 mg via INTRAVENOUS

## 2015-03-28 MED ORDER — OXYCODONE HCL 5 MG PO TABS: 5.0000 mg | ORAL_TABLET | Freq: Once | ORAL | Status: DC | PRN

## 2015-03-28 MED ORDER — MIDAZOLAM HCL 2 MG/2ML IJ SOLN
INTRAMUSCULAR | Status: AC
  Filled 2015-03-28: qty 2

## 2015-03-28 MED ORDER — CEFAZOLIN SODIUM-DEXTROSE 2-3 GM-% IV SOLR
INTRAVENOUS | Status: AC
  Administered 2015-03-28: 2 g via INTRAVENOUS
  Filled 2015-03-28: qty 50

## 2015-03-28 MED ORDER — ENOXAPARIN SODIUM 40 MG/0.4ML ~~LOC~~ SOLN
40.0000 mg | SUBCUTANEOUS | Status: DC
  Administered 2015-03-29: 40 mg via SUBCUTANEOUS
  Filled 2015-03-28: qty 0.4

## 2015-03-28 MED ORDER — MIDAZOLAM HCL 2 MG/2ML IJ SOLN
INTRAMUSCULAR | Status: AC
  Administered 2015-03-28: 1 mg via INTRAVENOUS
  Filled 2015-03-28: qty 2

## 2015-03-28 MED ORDER — HYDROMORPHONE HCL 1 MG/ML IJ SOLN
INTRAMUSCULAR | Status: DC | PRN
  Administered 2015-03-28: 1 mg via INTRAVENOUS
  Administered 2015-03-28 (×2): 0.5 mg via INTRAVENOUS

## 2015-03-28 MED ORDER — SODIUM CHLORIDE 0.9 % IJ SOLN
INTRAMUSCULAR | Status: DC | PRN
  Administered 2015-03-28: 20 mL

## 2015-03-28 MED ORDER — POLYETHYLENE GLYCOL 3350 17 G PO PACK
17.0000 g | PACK | Freq: Every day | ORAL | Status: DC
  Administered 2015-03-28: 17 g via ORAL
  Filled 2015-03-28: qty 1

## 2015-03-28 MED ORDER — ONDANSETRON HCL 4 MG PO TABS: 4.0000 mg | ORAL_TABLET | Freq: Four times a day (QID) | ORAL | Status: DC | PRN

## 2015-03-28 MED ORDER — METHOCARBAMOL 500 MG PO TABS
ORAL_TABLET | ORAL | Status: AC
  Administered 2015-03-28: 1000 mg via ORAL
  Filled 2015-03-28: qty 2

## 2015-03-28 MED ORDER — HYDROMORPHONE HCL 1 MG/ML IJ SOLN
INTRAMUSCULAR | Status: AC
  Administered 2015-03-28: 0.5 mg via INTRAVENOUS
  Filled 2015-03-28: qty 1

## 2015-03-28 MED ORDER — HYDROMORPHONE HCL 1 MG/ML IJ SOLN
0.2500 mg | INTRAMUSCULAR | Status: DC | PRN
  Administered 2015-03-28 (×6): 0.5 mg via INTRAVENOUS

## 2015-03-28 MED ORDER — CHLORHEXIDINE GLUCONATE 4 % EX LIQD: 60.0000 mL | Freq: Once | CUTANEOUS | Status: DC

## 2015-03-28 MED ORDER — FENTANYL CITRATE (PF) 100 MCG/2ML IJ SOLN
INTRAMUSCULAR | Status: DC | PRN
  Administered 2015-03-28: 150 ug via INTRAVENOUS
  Administered 2015-03-28: 100 ug via INTRAVENOUS

## 2015-03-28 MED ORDER — POTASSIUM CHLORIDE IN NACL 20-0.9 MEQ/L-% IV SOLN
INTRAVENOUS | Status: DC
  Administered 2015-03-29: 03:00:00 via INTRAVENOUS
  Filled 2015-03-28: qty 1000

## 2015-03-28 MED ORDER — KETOROLAC TROMETHAMINE 30 MG/ML IJ SOLN
INTRAMUSCULAR | Status: AC
  Filled 2015-03-28: qty 2

## 2015-03-28 MED ORDER — PROPOFOL 10 MG/ML IV BOLUS
INTRAVENOUS | Status: AC
  Filled 2015-03-28: qty 20

## 2015-03-28 MED ORDER — METHOCARBAMOL 500 MG PO TABS
1000.0000 mg | ORAL_TABLET | Freq: Four times a day (QID) | ORAL | Status: DC | PRN
  Administered 2015-03-28 – 2015-03-29 (×3): 1000 mg via ORAL
  Filled 2015-03-28 (×2): qty 2

## 2015-03-28 MED ORDER — ACETAMINOPHEN 325 MG PO TABS: 650.0000 mg | ORAL_TABLET | Freq: Four times a day (QID) | ORAL | Status: DC | PRN

## 2015-03-28 MED ORDER — MIDAZOLAM HCL 2 MG/2ML IJ SOLN
0.5000 mg | Freq: Once | INTRAMUSCULAR | Status: AC
  Administered 2015-03-28: 1 mg via INTRAVENOUS

## 2015-03-28 MED ORDER — ACETAMINOPHEN 650 MG RE SUPP: 650.0000 mg | Freq: Four times a day (QID) | RECTAL | Status: DC | PRN

## 2015-03-28 MED ORDER — OXYCODONE HCL 5 MG/5ML PO SOLN: 5.0000 mg | Freq: Once | ORAL | Status: DC | PRN

## 2015-03-28 MED ORDER — DOCUSATE SODIUM 100 MG PO CAPS
100.0000 mg | ORAL_CAPSULE | Freq: Two times a day (BID) | ORAL | Status: DC
Start: 2015-03-28 — End: 2015-03-29
  Administered 2015-03-28 – 2015-03-29 (×2): 100 mg via ORAL
  Filled 2015-03-28 (×2): qty 1

## 2015-03-28 MED ORDER — ONDANSETRON HCL 4 MG/2ML IJ SOLN
INTRAMUSCULAR | Status: DC | PRN
  Administered 2015-03-28: 4 mg via INTRAVENOUS

## 2015-03-28 MED ORDER — RILPIVIRINE HCL 25 MG PO TABS
25.0000 mg | ORAL_TABLET | Freq: Every day | ORAL | Status: DC
  Administered 2015-03-28 – 2015-03-29 (×2): 25 mg via ORAL
  Filled 2015-03-28 (×3): qty 1

## 2015-03-28 MED ORDER — 0.9 % SODIUM CHLORIDE (POUR BTL) OPTIME
TOPICAL | Status: DC | PRN
  Administered 2015-03-28: 1000 mL

## 2015-03-28 MED ORDER — LACTATED RINGERS IV SOLN
INTRAVENOUS | Status: DC
  Administered 2015-03-28 (×3): via INTRAVENOUS

## 2015-03-28 MED ORDER — PROPOFOL 10 MG/ML IV BOLUS
INTRAVENOUS | Status: DC | PRN
  Administered 2015-03-28: 200 mg via INTRAVENOUS

## 2015-03-28 MED ORDER — BUPIVACAINE LIPOSOME 1.3 % IJ SUSP
INTRAMUSCULAR | Status: DC | PRN
  Administered 2015-03-28: 20 mL

## 2015-03-28 MED ORDER — MIDAZOLAM HCL 5 MG/5ML IJ SOLN
INTRAMUSCULAR | Status: DC | PRN
  Administered 2015-03-28: 2 mg via INTRAVENOUS

## 2015-03-28 MED ORDER — BUPIVACAINE HCL (PF) 0.5 % IJ SOLN
INTRAMUSCULAR | Status: DC | PRN
  Administered 2015-03-28: 10 mL

## 2015-03-28 MED ORDER — KETOROLAC TROMETHAMINE 30 MG/ML IJ SOLN
INTRAMUSCULAR | Status: DC | PRN
  Administered 2015-03-28: 45 mg via INTRAVENOUS

## 2015-03-28 MED ORDER — OXYCODONE-ACETAMINOPHEN 5-325 MG PO TABS
ORAL_TABLET | ORAL | Status: AC
  Administered 2015-03-28: 2 via ORAL
  Filled 2015-03-28: qty 2

## 2015-03-28 MED ORDER — HYDROMORPHONE HCL 1 MG/ML IJ SOLN
INTRAMUSCULAR | Status: AC
  Administered 2015-03-28: 0.5 mg via INTRAVENOUS
  Filled 2015-03-28: qty 2

## 2015-03-28 SURGICAL SUPPLY — 84 items
BANDAGE ELASTIC 4 VELCRO ST LF (GAUZE/BANDAGES/DRESSINGS) ×3 IMPLANT
BANDAGE ELASTIC 6 VELCRO ST LF (GAUZE/BANDAGES/DRESSINGS) ×3 IMPLANT
BANDAGE ESMARK 6X9 LF (GAUZE/BANDAGES/DRESSINGS) ×1 IMPLANT
BIT DRILL 2.5X2.75 QC CALB (BIT) ×2 IMPLANT
BIT DRILL 2.9X70 QC CALB (BIT) ×2 IMPLANT
BLADE SURG 10 STRL SS (BLADE) ×3 IMPLANT
BLADE SURG 15 STRL LF DISP TIS (BLADE) ×1 IMPLANT
BLADE SURG 15 STRL SS (BLADE) ×3
BLADE SURG ROTATE 9660 (MISCELLANEOUS) IMPLANT
BNDG CMPR 9X6 STRL LF SNTH (GAUZE/BANDAGES/DRESSINGS) ×1
BNDG COHESIVE 4X5 TAN STRL (GAUZE/BANDAGES/DRESSINGS) ×3 IMPLANT
BNDG ESMARK 6X9 LF (GAUZE/BANDAGES/DRESSINGS) ×3
BNDG GAUZE ELAST 4 BULKY (GAUZE/BANDAGES/DRESSINGS) ×3 IMPLANT
BRUSH SCRUB DISP (MISCELLANEOUS) ×6 IMPLANT
CANISTER SUCT 3000ML PPV (MISCELLANEOUS) ×3 IMPLANT
COVER MAYO STAND STRL (DRAPES) ×3 IMPLANT
COVER SURGICAL LIGHT HANDLE (MISCELLANEOUS) ×3 IMPLANT
DRAIN PENROSE 1/4X12 LTX STRL (WOUND CARE) ×3 IMPLANT
DRAPE C-ARM 42X72 X-RAY (DRAPES) ×3 IMPLANT
DRAPE C-ARMOR (DRAPES) ×6 IMPLANT
DRAPE INCISE IOBAN 66X45 STRL (DRAPES) ×3 IMPLANT
DRAPE ORTHO SPLIT 77X108 STRL (DRAPES)
DRAPE SURG ORHT 6 SPLT 77X108 (DRAPES) IMPLANT
DRAPE U-SHAPE 47X51 STRL (DRAPES) ×3 IMPLANT
DRSG ADAPTIC 3X8 NADH LF (GAUZE/BANDAGES/DRESSINGS) ×3 IMPLANT
DRSG PAD ABDOMINAL 8X10 ST (GAUZE/BANDAGES/DRESSINGS) ×6 IMPLANT
ELECT REM PT RETURN 9FT ADLT (ELECTROSURGICAL) ×3
ELECTRODE REM PT RTRN 9FT ADLT (ELECTROSURGICAL) ×1 IMPLANT
EVACUATOR 1/8 PVC DRAIN (DRAIN) IMPLANT
EVACUATOR 3/16  PVC DRAIN (DRAIN)
EVACUATOR 3/16 PVC DRAIN (DRAIN) IMPLANT
GAUZE SPONGE 4X4 12PLY STRL (GAUZE/BANDAGES/DRESSINGS) ×3 IMPLANT
GLOVE BIO SURGEON STRL SZ7.5 (GLOVE) ×3 IMPLANT
GLOVE BIO SURGEON STRL SZ8 (GLOVE) ×3 IMPLANT
GLOVE BIOGEL PI IND STRL 7.5 (GLOVE) ×1 IMPLANT
GLOVE BIOGEL PI IND STRL 8 (GLOVE) ×1 IMPLANT
GLOVE BIOGEL PI INDICATOR 7.5 (GLOVE) ×2
GLOVE BIOGEL PI INDICATOR 8 (GLOVE) ×2
GOWN STRL REUS W/ TWL LRG LVL3 (GOWN DISPOSABLE) ×2 IMPLANT
GOWN STRL REUS W/ TWL XL LVL3 (GOWN DISPOSABLE) ×1 IMPLANT
GOWN STRL REUS W/TWL LRG LVL3 (GOWN DISPOSABLE) ×6
GOWN STRL REUS W/TWL XL LVL3 (GOWN DISPOSABLE) ×3
IMMOBILIZER KNEE 22 (SOFTGOODS) ×3 IMPLANT
IMMOBILIZER KNEE 22 UNIV (SOFTGOODS) ×3 IMPLANT
KIT BASIN OR (CUSTOM PROCEDURE TRAY) ×3 IMPLANT
KIT ROOM TURNOVER OR (KITS) ×3 IMPLANT
NDL SUT 6 .5 CRC .975X.05 MAYO (NEEDLE) IMPLANT
NEEDLE 22X1 1/2 (OR ONLY) (NEEDLE) ×2 IMPLANT
NEEDLE MAYO TAPER (NEEDLE)
NS IRRIG 1000ML POUR BTL (IV SOLUTION) ×3 IMPLANT
PACK ORTHO EXTREMITY (CUSTOM PROCEDURE TRAY) ×3 IMPLANT
PAD ARMBOARD 7.5X6 YLW CONV (MISCELLANEOUS) ×6 IMPLANT
PAD CAST 4YDX4 CTTN HI CHSV (CAST SUPPLIES) ×1 IMPLANT
PADDING CAST COTTON 4X4 STRL (CAST SUPPLIES) ×3
PADDING CAST COTTON 6X4 STRL (CAST SUPPLIES) ×3 IMPLANT
PLATE ACE LRG 74.7X35.6X1X3 HL (Plate) IMPLANT
PLATE ACE META (Plate) ×3 IMPLANT
SCREW CORT 3.5X44 815037044 (Screw) ×2 IMPLANT
SCREW CORTICAL 3.5MM 38MM (Screw) ×2 IMPLANT
SCREW NLOCK CANC HEX 4X55 (Screw) ×3 IMPLANT
SCREW NLOCK DIST FEM 4X65 (Screw) ×2 IMPLANT
SCREW NLOCK DIST FEM 4X7 (Screw) ×4 IMPLANT
SPONGE LAP 18X18 X RAY DECT (DISPOSABLE) ×3 IMPLANT
STAPLER VISISTAT 35W (STAPLE) ×3 IMPLANT
STOCKINETTE IMPERVIOUS LG (DRAPES) ×3 IMPLANT
SUCTION FRAZIER TIP 10 FR DISP (SUCTIONS) ×3 IMPLANT
SUT ETHILON 3 0 PS 1 (SUTURE) IMPLANT
SUT PROLENE 0 CT 2 (SUTURE) ×6 IMPLANT
SUT VIC AB 0 CT1 27 (SUTURE) ×3
SUT VIC AB 0 CT1 27XBRD ANBCTR (SUTURE) ×1 IMPLANT
SUT VIC AB 1 CT1 27 (SUTURE) ×3
SUT VIC AB 1 CT1 27XBRD ANBCTR (SUTURE) ×1 IMPLANT
SUT VIC AB 2-0 CT1 27 (SUTURE) ×6
SUT VIC AB 2-0 CT1 TAPERPNT 27 (SUTURE) ×2 IMPLANT
SYR 20ML ECCENTRIC (SYRINGE) IMPLANT
SYR CONTROL 10ML LL (SYRINGE) ×2 IMPLANT
TOWEL OR 17X24 6PK STRL BLUE (TOWEL DISPOSABLE) ×3 IMPLANT
TOWEL OR 17X26 10 PK STRL BLUE (TOWEL DISPOSABLE) ×6 IMPLANT
TRAY FOLEY CATH 16FRSI W/METER (SET/KITS/TRAYS/PACK) IMPLANT
TUBE CONNECTING 12'X1/4 (SUCTIONS) ×1
TUBE CONNECTING 12X1/4 (SUCTIONS) ×2 IMPLANT
WATER STERILE IRR 1000ML POUR (IV SOLUTION) ×6 IMPLANT
WIRE K 1.6MM 144256 (MISCELLANEOUS) ×6 IMPLANT
YANKAUER SUCT BULB TIP NO VENT (SUCTIONS) ×3 IMPLANT

## 2015-03-28 NOTE — Progress Notes (Signed)
Orthopedic Tech Progress Note Patient Details:  Clifford Shields 01/18/1986 [id number]  Ortho Devices Ortho Device/Splint Location: applied overhead frame to bed Ortho Device/Splint Interventions: Ordered, Application   Clifford Shields, Clifford Shields 03/28/2015, 7:59 PM

## 2015-03-28 NOTE — Transfer of Care (Signed)
Immediate Anesthesia Transfer of Care Note  Patient: Clifford Drummerntoine Shields  Procedure(s) Performed: Procedure(s): OPEN REDUCTION INTERNAL FIXATION (ORIF) LEFT TIBIAL PLATEAU FRACTURE (Left)  Patient Location: PACU  Anesthesia Type:General  Level of Consciousness: awake  Airway & Oxygen Therapy: Patient Spontanous Breathing and Patient connected to face mask oxygen  Post-op Assessment: Report given to RN  Post vital signs: Reviewed and stable  Last Vitals:  Filed Vitals:   03/28/15 1254  BP: 156/101  Pulse: 76  Temp: 36.9 C  Resp: 20    Complications: No apparent anesthesia complications

## 2015-03-28 NOTE — Anesthesia Procedure Notes (Signed)
Procedure Name: Intubation Performed by: Renae FickleICKINSON JR, Reginae Wolfrey THOMAS Pre-anesthesia Checklist: Patient identified, Emergency Drugs available, Suction available and Patient being monitored Patient Re-evaluated:Patient Re-evaluated prior to inductionOxygen Delivery Method: Circle system utilized Preoxygenation: Pre-oxygenation with 100% oxygen Intubation Type: IV induction Ventilation: Mask ventilation without difficulty Laryngoscope Size: 4 and Mac Grade View: Grade II Tube type: Oral Tube size: 7.5 mm Number of attempts: 1 Placement Confirmation: ETT inserted through vocal cords under direct vision,  positive ETCO2,  CO2 detector and breath sounds checked- equal and bilateral Secured at: 22 cm Tube secured with: Tape Dental Injury: Teeth and Oropharynx as per pre-operative assessment
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
## 2015-03-28 NOTE — Brief Op Note (Signed)
03/28/2015  4:54 PM  PATIENT:  Trilby DrummerAntoine Pyles  29 y.o. male  PRE-OPERATIVE DIAGNOSIS:  LEFT MEDIAL TIBIAL PLATEAU FRACTURE, FIBULAR HEAD AVULSION FRACTURE  POST-OPERATIVE DIAGNOSIS:   LEFT MEDIAL TIBIAL PLATEAU FRACTURE, FIBULAR HEAD AVULSION FRACTURE  PROCEDURE:  Procedure(s): 1. OPEN REDUCTION INTERNAL FIXATION (ORIF) LEFT TIBIAL PLATEAU FRACTURE (Left) 2. STRESS MANIPULATION UNDER FLOURO, LEFT KNEE 3. CLOSED TREATMENT OF LEFT FIBULA FRACTURE 4. ASPIRATION LEFT KNEE HEMATOMA  SURGEON:  Surgeon(s) and Role:    * Myrene GalasMichael Cru Kritikos, MD - Primary  PHYSICIAN ASSISTANT: Montez MoritaKeith Paul, PA-C  ANESTHESIA:   general  I/O:  Total I/O In: 750 [I.V.:750] Out: -   SPECIMEN:  No Specimen  TOURNIQUET:    DICTATION: .Other Dictation: Dictation Number [id number]

## 2015-03-28 NOTE — Anesthesia Preprocedure Evaluation (Addendum)
Anesthesia Evaluation  Patient identified by MRN, date of birth, ID band Patient awake    Reviewed: Allergy & Precautions, NPO status , Patient's Chart, lab work & pertinent test results  Airway Mallampati: II  TM Distance: >3 FB Neck ROM: full    Dental  (+) Teeth Intact, Dental Advidsory Given   Pulmonary Current Smoker,  breath sounds clear to auscultation        Cardiovascular Exercise Tolerance: Good hypertension, Pt. on medications Rhythm:regular Rate:Normal     Neuro/Psych    GI/Hepatic negative GI ROS, (+) Hepatitis -, B  Endo/Other    Renal/GU Renal diseaseproteinuria     Musculoskeletal   Abdominal   Peds  Hematology  (+) HIV, On antiretroviral therapy with viral load<20 copies; CD4 count normal with low CD4 T helper cell @<26%   Anesthesia Other Findings   Reproductive/Obstetrics                         Anesthesia Physical Anesthesia Plan  ASA: II  Anesthesia Plan: General   Post-op Pain Management:    Induction: Intravenous  Airway Management Planned: Oral ETT  Additional Equipment:   Intra-op Plan:   Post-operative Plan: Extubation in OR  Informed Consent: I have reviewed the patients History and Physical, chart, labs and discussed the procedure including the risks, benefits and alternatives for the proposed anesthesia with the patient or authorized representative who has indicated his/her understanding and acceptance.     Plan Discussed with: CRNA, Anesthesiologist and Surgeon  Anesthesia Plan Comments:         Anesthesia Quick Evaluation

## 2015-03-29 ENCOUNTER — Encounter (HOSPITAL_COMMUNITY): Payer: Self-pay | Admitting: Orthopedic Surgery

## 2015-03-29 DIAGNOSIS — E349 Endocrine disorder, unspecified: Secondary | ICD-10-CM | POA: Diagnosis present

## 2015-03-29 DIAGNOSIS — E559 Vitamin D deficiency, unspecified: Secondary | ICD-10-CM | POA: Diagnosis present

## 2015-03-29 DIAGNOSIS — F1210 Cannabis abuse, uncomplicated: Secondary | ICD-10-CM | POA: Diagnosis present

## 2015-03-29 LAB — IRON AND TIBC
IRON: 30 ug/dL — AB (ref 45–182)
Saturation Ratios: 9 % — ABNORMAL LOW (ref 17.9–39.5)
TIBC: 339 ug/dL (ref 250–450)
UIBC: 309 ug/dL

## 2015-03-29 LAB — HEPATITIS PANEL, ACUTE
HEP A IGM: NEGATIVE
Hep B C IgM: NEGATIVE
Hepatitis B Surface Ag: NEGATIVE

## 2015-03-29 LAB — PTH, INTACT AND CALCIUM
Calcium, Total (PTH): 8.6 mg/dL — ABNORMAL LOW (ref 8.7–10.2)
PTH: 19 pg/mL (ref 15–65)

## 2015-03-29 LAB — RETICULOCYTES
RBC.: 4.27 MIL/uL (ref 4.22–5.81)
Retic Count, Absolute: 25.6 10*3/uL (ref 19.0–186.0)
Retic Ct Pct: 0.6 % (ref 0.4–3.1)

## 2015-03-29 LAB — FERRITIN: Ferritin: 142 ng/mL (ref 24–336)

## 2015-03-29 LAB — TESTOSTERONE: TESTOSTERONE: 35 ng/dL — AB (ref 348–1197)

## 2015-03-29 LAB — FOLATE: Folate: 11.3 ng/mL (ref >5.9)

## 2015-03-29 LAB — VITAMIN B12: VITAMIN B 12: 218 pg/mL (ref 180–914)

## 2015-03-29 LAB — TESTOSTERONE, FREE: Testosterone, Free: 7.6 pg/mL — ABNORMAL LOW (ref 9.3–26.5)

## 2015-03-29 LAB — VITAMIN D 25 HYDROXY (VIT D DEFICIENCY, FRACTURES): VIT D 25 HYDROXY: 11.9 ng/mL — AB (ref 30.0–100.0)

## 2015-03-29 LAB — T-HELPER CELLS (CD4) COUNT (NOT AT ARMC)
CD4 T CELL HELPER: 26 % — AB (ref 33–55)
CD4 T Cell Abs: 520 /uL (ref 400–2700)

## 2015-03-29 LAB — SEX HORMONE BINDING GLOBULIN: Sex Hormone Binding: 31.1 nmol/L (ref 16.5–55.9)

## 2015-03-29 MED ORDER — VITAMIN C 500 MG PO TABS
1000.0000 mg | ORAL_TABLET | Freq: Every day | ORAL | Status: DC
  Administered 2015-03-29: 1000 mg via ORAL
  Filled 2015-03-29: qty 2

## 2015-03-29 MED ORDER — ASCORBIC ACID 1000 MG PO TABS
1000.0000 mg | ORAL_TABLET | Freq: Every day | ORAL | Status: DC
Start: 2015-03-29 — End: 2016-11-05

## 2015-03-29 MED ORDER — VITAMIN D 1000 UNITS PO TABS
1000.0000 [IU] | ORAL_TABLET | Freq: Two times a day (BID) | ORAL | Status: DC
  Administered 2015-03-29: 1000 [IU] via ORAL
  Filled 2015-03-29: qty 1

## 2015-03-29 MED ORDER — VITAMIN D (ERGOCALCIFEROL) 1.25 MG (50000 UNIT) PO CAPS
50000.0000 [IU] | ORAL_CAPSULE | ORAL | Status: DC
  Administered 2015-03-29: 50000 [IU] via ORAL
  Filled 2015-03-29: qty 1

## 2015-03-29 MED ORDER — VITAMIN D 1000 UNITS PO TABS: 1000.0000 [IU] | ORAL_TABLET | Freq: Two times a day (BID) | ORAL | Status: DC

## 2015-03-29 MED ORDER — CHOLECALCIFEROL 25 MCG (1000 UT) PO TABS: 1000.0000 [IU] | ORAL_TABLET | Freq: Two times a day (BID) | ORAL | Status: DC

## 2015-03-29 MED ORDER — VITAMIN D (ERGOCALCIFEROL) 1.25 MG (50000 UNIT) PO CAPS: 50000.0000 [IU] | ORAL_CAPSULE | ORAL | Status: DC

## 2015-03-29 MED ORDER — ASPIRIN EC 325 MG PO TBEC: 325.0000 mg | DELAYED_RELEASE_TABLET | Freq: Two times a day (BID) | ORAL | Status: DC

## 2015-03-29 NOTE — Anesthesia Postprocedure Evaluation (Signed)
Anesthesia Post Note  Patient: Clifford Shields Drummerntoine Senger  Procedure(s) Performed: Procedure(s) (LRB): OPEN REDUCTION INTERNAL FIXATION (ORIF) LEFT TIBIAL PLATEAU FRACTURE (Left)  Anesthesia type: General  Patient location: PACU  Post pain: Pain level controlled and Adequate analgesia  Post assessment: Post-op Vital signs reviewed, Patient's Cardiovascular Status Stable, Respiratory Function Stable, Patent Airway and Pain level controlled  Last Vitals:  Filed Vitals:   03/29/15 1000  BP:   Pulse: 103  Temp:   Resp:     Post vital signs: Reviewed and stable  Level of consciousness: awake, alert  and oriented  Complications: No apparent anesthesia complications
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
## 2015-03-29 NOTE — Progress Notes (Signed)
Orthopedic Tech Progress Note Patient Details:  Trilby Drummerntoine Hayner 12/27/1985 [id number]  Patient ID: Trilby DrummerAntoine Fiscus, male   DOB: 10/19/1985, 29 y.o.   MRN: [id number] Called in advanced brace order; spoke with Ferdinand LangoKanisha  Geni Skorupski 03/29/2015, 9:10 AM

## 2015-03-29 NOTE — Op Note (Signed)
NAMMarland Kitchen:  Clifford Shields, Clifford Shields           ACCOUNT NO.:  [id number]  MEDICAL RECORD NO.:  [id number]  LOCATION:  [id number]                        FACILITY:  [facility]  PHYSICIAN:  Doralee AlbinoMichael H. Carola FrostHandy, M.D. DATE OF BIRTH:  1985/11/15  DATE OF PROCEDURE:  03/28/2015 DATE OF DISCHARGE:                              OPERATIVE REPORT   PREOPERATIVE DIAGNOSES: 1. Displaced left medial tibial plateau fracture. 2. Left fibular head avulsion fracture.  POSTOPERATIVE DIAGNOSES: 1. Displaced left medial tibial plateau fracture. 2. Left fibular head avulsion fracture.  PROCEDURES: 1. ORIF of left tibial plateau fracture. 2. Stress manipulation under fluoro, left knee. 3. Closed treatment of left fibular head fracture. 4. Aspiration of left knee hematoma.  SURGEON:  Doralee AlbinoMichael H. Carola FrostHandy, M.D.  ASSISTANT:  [name], PA-C.  ANESTHESIA:  General.  COMPLICATIONS:  None.  TOURNIQUET:  None.  DISPOSITION:  To PACU.  CONDITION:  Stable.  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  He was initially seen and evaluated in the ED where he was placed in knee immobilizer to allow for soft tissue swelling resolution and given follow up in our office.  I discussed with the patient the risks and benefits of surgical repair including possibility of infection, nerve injury, vessel injury, DVT, PE, heart attack, stroke, the potential for further surgery including ligament reconstruction in the event that he had a concomitant cruciate injury and that if after repair of the medial plateau day we were to stress the knee intraoperatively and identify significant instability laterally that we would proceed with primary repair of the lateral side as well.  The patient acknowledged these risk and did wish to proceed.  BRIEF SUMMARY OF PROCEDURE:  The patient received Ancef preoperatively. He was taken to the  operating room and general anesthesia was induced. We did not use a tourniquet.  The left lower extremity was prepped and draped in usual sterile fashion.  A medial incision was made after time- out and dissection carried carefully down to the sartorius fascia which was split to reveal the pes tendons.  The tendons were then mobilized with a 0.25 inch Penrose.  The periosteum remained intact over the fracture site which was at the brim. I consequently identified radiographically and placed an anteromedial buttress plate directly over the fracture using a low contour Biomet lower extremity T-plate.  My assistant Montez MoritaKeith Paul pulled traction and applied valgus to reduce the fracture.  I then placed a Darrick PennaKing Tong clamp from anteromedial to posterolateral and secured it with a distal screw and then 3 proximal screws in the subchondral area placing them to lag the fracture site together, 1 additional screw was placed in the metaphysis.  Images confirmed excellent reduction and appropriate plate placement, screw trajectory, and length.  The wound was irrigated thoroughly.  I then brought in C-arm and also confirmed that there was no significant opening to varus stress and this was minimal, furthermore I did not identify any diastasis increase at the fibular head.  After a layered closure using 2-0 Vicryl and 3-0 nylon, sterile gently compressive dressing was applied and then a  hinged knee brace.  [name], once again assisted me throughout including with wound closure and the procedure could not have been done without his assistance as it was required for reduction while I was supplying provisional and definitive fixation.  PROGNOSIS:  [name] is to be nonweightbearing with unrestricted range of motion of the knee and ankle.  We anticipate an overnight stay and discharge tomorrow.  He will likely resume weightbearing at 6 weeks. He will undergo a metabolic bone workup as his bone quality  did not seem to correlate well with his young age.  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  At this time, I did not anticipate that.     Doralee Albino. Carola Frost, M.D.     [facility]/MEDQ  D:  03/28/2015  T:  03/29/2015  Job:  [id number]  cc:   Debria Garret D. Shon Baton, M.D.

## 2015-03-29 NOTE — Progress Notes (Signed)
[facility] Progress Note  Subjective  Doing ok  Reports pain in L knee, appears very comfortable  Has not been out of bed yet Tolerating diet Pt would not get off phone initially when I entered room   Review of Systems  Constitutional: Negative for fever and chills.  Respiratory: Negative for shortness of breath and wheezing.   Cardiovascular: Negative for chest pain and palpitations.  Gastrointestinal: Negative for nausea, vomiting and abdominal pain.  Genitourinary: Negative for dysuria.  Neurological: Negative for tingling, sensory change and headaches.     Objective   BP 167/97 mmHg  Pulse 86  Temp(Src) 99 F (37.2 C) (Oral)  Resp 17  Ht _0  (1.778 m)  Wt 83.915 kg (185 lb)  BMI 26.54 kg/m2  SpO2 100%  Intake/Output      07/14 0701 - 07/15 0700 07/15 0701 - 07/16 0700   P.O.  120   I.V. (mL/kg) 1143.3 (13.6)    IV Piggyback 100    Total Intake(mL/kg) 1243.3 (14.8) 120 (1.4)   Urine (mL/kg/hr) 650    Emesis/NG output 0    Total Output 650     Net +593.3 +120        Urine Occurrence 1 x    Emesis Occurrence 1 x      Labs  Results for Clifford, Shields (MRN [id number]) as of 03/29/2015 09:22  Ref. Range 03/28/2015 12:50  Sodium Latest Ref Range: 135-145 mmol/L 142  Potassium Latest Ref Range: 3.5-5.1 mmol/L 3.9  Chloride Latest Ref Range: 101-111 mmol/L 108  CO2 Latest Ref Range: 22-32 mmol/L 26  BUN Latest Ref Range: 6-20 mg/dL 14  Creatinine Latest Ref Range: 0.61-1.24 mg/dL 1.04  Calcium Latest Ref Range: 8.9-10.3 mg/dL 10.0  EGFR (Non-African Amer.) Latest Ref Range: >60 mL/min >60  EGFR (African American) Latest Ref Range: >60 mL/min >60  Glucose Latest Ref Range: 65-99 mg/dL 99  Anion gap Latest Ref Range: 5-15  8  Calcium, Total (PTH) Latest Ref Range: 8.7-10.2 mg/dL 8.6 (L)  Phosphorus Latest Ref Range: 2.5-4.6 mg/dL 2.8  Magnesium Latest Ref Range: 1.7-2.4 mg/dL 2.2  Alkaline Phosphatase Latest Ref Range: 38-126 U/L 67   Albumin Latest Ref Range: 3.5-5.0 g/dL 3.8  AST Latest Ref Range: 15-41 U/L 28  ALT Latest Ref Range: 17-63 U/L 18  Total Protein Latest Ref Range: 6.5-8.1 g/dL 8.1  Total Bilirubin Latest Ref Range: 0.3-1.2 mg/dL 0.7  PREALBUMIN Latest Ref Range: 18-38 mg/dL 31.3  Vit D, 25-Hydroxy Latest Ref Range: 30.0-100.0 ng/mL 11.9 (L)  WBC Latest Ref Range: 4.0-10.5 K/uL 6.4  RBC Latest Ref Range: 4.22-5.81 MIL/uL 4.56  Hemoglobin Latest Ref Range: 13.0-17.0 g/dL 12.2 (L)  HCT Latest Ref Range: 39.0-52.0 % 37.3 (L)  MCV Latest Ref Range: 78.0-100.0 fL 81.8  MCH Latest Ref Range: 26.0-34.0 pg 26.8  MCHC Latest Ref Range: 30.0-36.0 g/dL 32.7  RDW Latest Ref Range: 11.5-15.5 % 13.1  Platelets Latest Ref Range: 150-400 K/uL 353  Neutrophils Latest Ref Range: 43-77 % 58  Lymphocytes Latest Ref Range: 12-46 % 30  Monocytes Relative Latest Ref Range: 3-12 % 6  Eosinophil Latest Ref Range: 0-5 % 6 (H)  Basophil Latest Ref Range: 0-1 % 0  NEUT# Latest Ref Range: 1.7-7.7 K/uL 3.7  Lymphocyte # Latest Ref Range: 0.7-4.0 K/uL 2.0  Monocyte # Latest Ref Range: 0.1-1.0 K/uL 0.4  Eosinophils Absolute Latest Ref Range: 0.0-0.7 K/uL 0.4  Basophils Absolute Latest Ref Range: 0.0-0.1 K/uL 0.0  Prothrombin Time Latest Ref Range: 11.6-15.2  seconds 14.4  INR Latest Ref Range: 0.00-1.49  1.10  APTT Latest Ref Range: 24-37 seconds 28  Sex Hormone Binding Latest Ref Range: 16.5-55.9 nmol/L 31.1  Testosterone Free Latest Ref Range: 9.3-26.5 pg/mL 7.6 (L)  PTH Latest Ref Range: 15-65 pg/mL 19   Exam  Gen: awake, alert, NAD, resting comfortably in bed, NAD  Lungs: clear anterior fields Cardiac: S1 and S2, RRR  Abd: +BS, NTND Ext:       Left Lower Extremity   Dressing c/d/i  Resting with knee in flexion  DPN, SPN, TN sensation intact  EHL, FHL, AT, PT, peroneals, Gastroc motor intact  Ext warm  Compartments soft  + DP pulse   No DCT    Assessment and Plan   POD/HD#: 1   29 y/o male s/p ORIF L  tibial plateau fracture    1. L medial tibial plateau fx s/p ORIF   NWB x 6 weeks  Unrestricted ROM L knee  Hinged brace ordere- to be on at all times, can sleep without brace  No pillows under bend of knee at rest   Reviewed this in great length with pt   Needs to rest with leg in full extension   Ice and elevate  Dressing changes starting 03/31/2015  2. Pain management:  Percocet, oxy IR and robaxin  Pt received Rx's at office on 03/27/2015  3. ABL anemia/Hemodynamics  stable 4. Medical issues   +HIV- contacted ID service. They will schedule f/u   Vitamin d deficiency and Testosterone deficiency    Suspect poor bone density multifactorial including marijuana use, antiretroviral use, nutritional component   Start vitamin d2 50,000 IU weekly x 12 weeks   Vitamin d3 1000 IU BID   Recheck testosterone and vitamin d in 12 weeks   Additional labs pending  5. DVT/PE prophylaxis:  ASA 325 mg po BID at dc   6. ID:   Completed periop abx  7. Metabolic Bone Disease:  See #4  8. Activity:  OT/PT evals  9. FEN/Foley/Lines:  Diet as tolerated  DC IV and IVF  10. Impediments to fracture healing:  See #4  11. Dispo:  Dc home this afternoon  F/u in 2 weeks with ortho  F/u with ID     Jari Pigg, PA-C [facility] [phone] (O) 03/29/2015 9:19 AM

## 2015-03-29 NOTE — Evaluation (Signed)
Occupational Therapy Evaluation Patient Details Name: Clifford Shields MRN: [id number] DOB: 05/05/1986 Today's Date: 03/29/2015    History of Present Illness Clifford Shields is a 29 y.o. male who complains of Left knee pain. [place] Seat while running from [employer]. C/O left knee pain. Denies other injuries.  Pt underwent ORIF for tibial plateau fracture.   Clinical Impression   Pt overall modified independent level for use of crutches with toileting tasks and functional mobility.  Pt inquiring about a wheelchair so instructed him to discuss with PT when they evaluate as well.  Educated on the need for a reacher to help with dressing the left foot.  Pt with no further OT needs at this time.  He reports having PRN assistance at discharge.      Follow Up Recommendations  No OT follow up;Supervision - Intermittent    Equipment Recommendations  None recommended by OT       Precautions / Restrictions Precautions Precautions: Fall Precaution Comments: Pt to receive hinged brace but not in room and PA gave OK to see with KI Required Braces or Orthoses: Knee Immobilizer - Left Knee Immobilizer - Left: On at all times Restrictions Weight Bearing Restrictions: Yes LLE Weight Bearing: Non weight bearing      Mobility Bed Mobility Overal bed mobility: Modified Independent             General bed mobility comments: Increased time pt able to manage his LLE using his UEs as needed, but still with significant pain  Transfers Overall transfer level: Modified independent Equipment used: Crutches             General transfer comment: Pt able to use crutches appropriately for transfers to the toilet at this time.    Balance Overall balance assessment: Modified Independent   Sitting balance-Leahy Scale: Normal       Standing balance-Leahy Scale: Fair Standing balance comment: Pt able to maintain standing balance but needs UE support with at least one LE.                             ADL Overall ADL's : Needs assistance/impaired Eating/Feeding: Independent   Grooming: Wash/dry hands;Wash/dry face;Modified independent;Standing;Sitting   Upper Body Bathing: Modified independent;Sitting   Lower Body Bathing: Minimal assistance;Sit to/from stand   Upper Body Dressing : Modified independent;Sitting   Lower Body Dressing: Minimal assistance   Toilet Transfer: Modified Independent;Ambulation (crutches)   Toileting- Clothing Manipulation and Hygiene: Modified independent;Sit to/from stand       Functional mobility during ADLs: Modified independent (crutches) General ADL Comments: Pt currently modified independent to toilet and perform functional transfers.  Decreased ability to reach the LLE for dressing tasks so discussed purchase of reacher to assist with this.  Pt plans to wear a shoe on the RLE but will keep the sock on the right secondary to NWBing status.      Vision Vision Assessment?: No apparent visual deficits   Perception Perception Perception Tested?: No   Praxis Praxis Praxis tested?: Not tested    Pertinent Vitals/Pain Pain Assessment: 0-10 Pain Score: 8  Pain Location: left leg Pain Intervention(s): Premedicated before session;Repositioned     Hand Dominance Right   Extremity/Trunk Assessment Upper Extremity Assessment Upper Extremity Assessment: Overall WFL for tasks assessed   Lower Extremity Assessment Lower Extremity Assessment: Defer to PT evaluation   Cervical / Trunk Assessment Cervical / Trunk Assessment: Normal   Communication Communication Communication: No difficulties  Cognition Arousal/Alertness: Awake/alert Behavior During Therapy: WFL for tasks assessed/performed Overall Cognitive Status: Within Functional Limits for tasks assessed                                Home Living Family/patient expects to be discharged to:: Private residence Living Arrangements: Alone Available Help  at Discharge: Family;Available PRN/intermittently Type of Home: Apartment Home Access: Ramped entrance     Home Layout: One level     Bathroom Shower/Tub: Tub/shower unit Shower/tub characteristics: Engineer, building servicesCurtain Bathroom Toilet: Standard     Home Equipment: None;Crutches          Prior Functioning/Environment Level of Independence: Independent                                       End of Session Equipment Utilized During Treatment: Other (comment) (crutches) Nurse Communication: Mobility status  Activity Tolerance: Patient tolerated treatment well;Patient limited by pain Patient left: with call bell/phone within reach;in bed   Time: 0920-0955 OT Time Calculation (min): 35 min Charges:  OT General Charges $OT Visit: 1 Procedure OT Evaluation $Initial OT Evaluation Tier I: 1 Procedure OT Treatments $Self Care/Home Management : 8-22 mins G-Codes: OT G-codes **NOT FOR INPATIENT CLASS** Functional Assessment Tool Used: clinical judgement Functional Limitation: Self care Self Care Current Status ([id number](G8987): At least 1 percent but less than 20 percent impaired, limited or restricted Self Care Goal Status (U0454(G8988): At least 1 percent but less than 20 percent impaired, limited or restricted Self Care Discharge Status [phone](G8989): At least 1 percent but less than 20 percent impaired, limited or restricted Creta Dorame OTR/L 03/29/2015, 10:12 AM

## 2015-03-29 NOTE — Discharge Instructions (Signed)
[facility] Discharge Instructions   General Discharge Instructions  WEIGHT BEARING STATUS: Nonweightbearing Left Leg   RANGE OF MOTION/ACTIVITY: Range of motion as tolerated Left knee   PAIN MEDICATION USE AND EXPECTATIONS  You have likely been given narcotic medications to help control your pain.  After a traumatic event that results in an fracture (broken bone) with or without surgery, it is ok to use narcotic pain medications to help control one's pain.  We understand that everyone responds to pain differently and each individual patient will be evaluated on a regular basis for the continued need for narcotic medications. Ideally, narcotic medication use should last no more than 6-8 weeks (coinciding with fracture healing).   As a patient it is your responsibility as well to monitor narcotic medication use and report the amount and frequency you use these medications when you come to your office visit.   We would also advise that if you are using narcotic medications, you should take a dose prior to therapy to maximize you participation.  IF YOU ARE ON NARCOTIC MEDICATIONS IT IS NOT PERMISSIBLE TO OPERATE A MOTOR VEHICLE (MOTORCYCLE/CAR/TRUCK/MOPED) OR HEAVY MACHINERY DO NOT MIX NARCOTICS WITH OTHER CNS (CENTRAL NERVOUS SYSTEM) DEPRESSANTS SUCH AS ALCOHOL  Diet: as you were eating previously.  Can use over the counter stool softeners and bowel preparations, such as Miralax, to help with bowel movements.  Narcotics can be constipating.  Be sure to drink plenty of fluids  Wound Care: daily dressing changes starting on 03/31/2015. See instructions below   STOP SMOKING OR USING NICOTINE PRODUCTS!!!!  As discussed nicotine severely impairs your body's ability to heal surgical and traumatic wounds but also impairs bone healing.  Wounds and bone heal by forming microscopic blood vessels (angiogenesis) and nicotine is a vasoconstrictor (essentially, shrinks blood vessels).  Therefore,  if vasoconstriction occurs to these microscopic blood vessels they essentially disappear and are unable to deliver necessary nutrients to the healing tissue.  This is one modifiable factor that you can do to dramatically increase your chances of healing your injury.    (This means no smoking, no nicotine gum, patches, etc)  DO NOT USE NONSTEROIDAL ANTI-INFLAMMATORY DRUGS (NSAID'S)  Using products such as Advil (ibuprofen), Aleve (naproxen), Motrin (ibuprofen) for additional pain control during fracture healing can delay and/or prevent the healing response.  If you would like to take over the counter (OTC) medication, Tylenol (acetaminophen) is ok.  However, some narcotic medications that are given for pain control contain acetaminophen as well. Therefore, you should not exceed more than 4000 mg of tylenol in a day if you do not have liver disease.  Also note that there are may OTC medicines, such as cold medicines and allergy medicines that my contain tylenol as well.  If you have any questions about medications and/or interactions please ask your doctor/PA or your pharmacist.      ICE AND ELEVATE INJURED/OPERATIVE EXTREMITY  Using ice and elevating the injured extremity above your heart can help with swelling and pain control.  Icing in a pulsatile fashion, such as 20 minutes on and 20 minutes off, can be followed.    Do not place ice directly on skin. Make sure there is a barrier between to skin and the ice pack.    Using frozen items such as frozen peas works well as the conform nicely to the are that needs to be iced.  USE AN ACE WRAP OR TED HOSE FOR SWELLING CONTROL  In addition to icing  and elevation, Ace wraps or TED hose are used to help limit and resolve swelling.  It is recommended to use Ace wraps or TED hose until you are informed to stop.    When using Ace Wraps start the wrapping distally (farthest away from the body) and wrap proximally (closer to the body)   Example: If you had surgery  on your leg or thing and you do not have a splint on, start the ace wrap at the toes and work your way up to the thigh        If you had surgery on your upper extremity and do not have a splint on, start the ace wrap at your fingers and work your way up to the upper arm  IF YOU ARE IN A SPLINT OR CAST DO NOT REMOVE IT FOR ANY REASON   If your splint gets wet for any reason please contact the office immediately. You may shower in your splint or cast as long as you keep it dry.  This can be done by wrapping in a cast cover or garbage back (or similar)  Do Not stick any thing down your splint or cast such as pencils, money, or hangers to try and scratch yourself with.  If you feel itchy take benadryl as prescribed on the bottle for itching  IF YOU ARE IN A CAM BOOT (BLACK BOOT)  You may remove boot periodically. Perform daily dressing changes as noted below.  Wash the liner of the boot regularly and wear a sock when wearing the boot. It is recommended that you sleep in the boot until told otherwise  CALL THE OFFICE WITH ANY QUESTIONS OR CONCERTS: [phone]     Discharge Pin Site Instructions  Dress pins daily with Kerlix roll starting on POD 2. Wrap the Kerlix so that it tamps the skin down around the pin-skin interface to prevent/limit motion of the skin relative to the pin.  (Pin-skin motion is the primary cause of pain and infection related to external fixator pin sites).  Remove any crust or coagulum that may obstruct drainage with a saline moistened gauze or soap and water.  After POD 3, if there is no discernable drainage on the pin site dressing, the interval for change can by increased to every other day.  You may shower with the fixator, cleaning all pin sites gently with soap and water.  If you have a surgical wound this needs to be completely dry and without drainage before showering.  The extremity can be lifted by the fixator to facilitate wound care and transfers.  Notify the  office/Doctor if you experience increasing drainage, redness, or pain from a pin site, or if you notice purulent (thick, snot-like) drainage.  Discharge Wound Care Instructions  Do NOT apply any ointments, solutions or lotions to pin sites or surgical wounds.  These prevent needed drainage and even though solutions like hydrogen peroxide kill bacteria, they also damage cells lining the pin sites that help fight infection.  Applying lotions or ointments can keep the wounds moist and can cause them to breakdown and open up as well. This can increase the risk for infection. When in doubt call the office.  Surgical incisions should be dressed daily.  If any drainage is noted, use one layer of adaptic, then gauze, Kerlix, and an ace wrap.  Once the incision is completely dry and without drainage, it may be left open to air out.  Showering may begin 36-48 hours later.  Cleaning  gently with soap and water.  Traumatic wounds should be dressed daily as well.    One layer of adaptic, gauze, Kerlix, then ace wrap.  The adaptic can be discontinued once the draining has ceased    If you have a wet to dry dressing: wet the gauze with saline the squeeze as much saline out so the gauze is moist (not soaking wet), place moistened gauze over wound, then place a dry gauze over the moist one, followed by Kerlix wrap, then ace wrap.

## 2015-03-29 NOTE — Care Management Note (Signed)
Case Management Note  Patient Details  Name: Clifford Shields MRN: [id number] Date of Birth: 04/05/1986  Subjective/Objective:    29 yr old male s/p ORIF of left tibial plateau fracture                Action/Plan:  Case manager ordered DME   Expected Discharge Date:    03/29/15             Expected Discharge Plan:   Home  In-House Referral:     Discharge planning Services   DME  Post Acute Care Choice:    Choice offered to:     DME Arranged:   wheelchair DME Agency:   [facility]  HH Arranged:   NA HH Agency:     Status of Service:   Completed  Medicare Important Message Given:    Date Medicare IM Given:    Medicare IM give by:    Date Additional Medicare IM Given:    Additional Medicare Important Message give by:     If discussed at Long Length of Stay Meetings, dates discussed:    Additional Comments:  Durenda GuthrieBrady, Salih Williamson Naomi, RN 03/29/2015, 2:47 PM

## 2015-03-29 NOTE — Evaluation (Signed)
Physical Therapy Evaluation Patient Details Name: Clifford Shields MRN: [id number] DOB: [birth year] Today's Date: 03/29/2015   History of Present Illness  Clifford Shields is a 29 y.o. male who complains of Left knee pain. [place]arey Seat while running from [employer]. C/O left knee pain. Denies other injuries.  Pt underwent ORIF for tibial plateau fracture.  Clinical Impression  Pt limited in ambulation distance due to pain, however is moving at mod I level with use of crutches.  Provided education on ROM and exercises in supine, however do not recommend any follow up at this time.  May benefit from OP PT once able to WB.  Recommend w/c with L elevating leg rest due to needing to get around campus at [facility].  Case manager notified.      Follow Up Recommendations No PT follow up    Equipment Recommendations  Wheelchair (measurements PT) (L elevating leg rest)    Recommendations for Other Services       Precautions / Restrictions        Mobility  Bed Mobility Overal bed mobility: Modified Independent             General bed mobility comments: Increased time pt able to manage his LLE using his UEs as needed, but still with significant pain  Transfers Overall transfer level: Modified independent Equipment used: Crutches             General transfer comment: Pt able to use crutches appropriately for transfers, good balance on RLE while getting crutches into place.   Ambulation/Gait Ambulation/Gait assistance: Modified independent (Device/Increase time) Ambulation Distance (Feet): 75 Feet Assistive device: Crutches Gait Pattern/deviations: Step-to pattern     General Gait Details: Pt mod I with crutches, good safety  Stairs            Wheelchair Mobility    Modified Rankin (Stroke Patients Only)       Balance     Sitting balance-Leahy Scale: Normal       Standing balance-Leahy Scale: Fair Standing balance comment: Able to maintain balance, but needs  single UE support while getting crutches into place                             Pertinent Vitals/Pain Pain Assessment: 0-10 Pain Score: 8  Pain Location: L leg Pain Descriptors / Indicators: Aching Pain Intervention(s): Limited activity within patient's tolerance;Monitored during session;Premedicated before session    Home Living Family/patient expects to be discharged to:: Private residence Living Arrangements: Alone Available Help at Discharge: Family;Available PRN/intermittently Type of Home: Apartment Home Access: Ramped entrance     Home Layout: One level Home Equipment: None;Crutches      Prior Function Level of Independence: Independent               Hand Dominance   Dominant Hand: Right    Extremity/Trunk Assessment               Lower Extremity Assessment: LLE deficits/detail   LLE Deficits / Details: unable to formally assess due to NWB precautions.   Cervical / Trunk Assessment: Normal  Communication   Communication: No difficulties  Cognition Arousal/Alertness: Awake/alert Behavior During Therapy: WFL for tasks assessed/performed Overall Cognitive Status: Within Functional Limits for tasks assessed                      General Comments      Exercises Other Exercises Other Exercises: Provided education  on performing self ROM for knee flex, performing SLR along with hip abd exercises 2-3 times a day.       Assessment/Plan    PT Assessment Patent does not need any further PT services  PT Diagnosis Acute pain;Difficulty walking   PT Problem List    PT Treatment Interventions     PT Goals (Current goals can be found in the Care Plan section) Acute Rehab PT Goals PT Goal Formulation: All assessment and education complete, DC therapy    Frequency     Barriers to discharge        Co-evaluation               End of Session Equipment Utilized During Treatment: Left knee immobilizer Activity Tolerance:  Patient limited by pain Patient left: in bed;with call bell/phone within reach;with family/visitor present Nurse Communication: Mobility status    Functional Assessment Tool Used: assist level and clinical judgement Functional Limitation: Mobility: Walking and moving around Mobility: Walking and Moving Around Current Status [phone](G8978): 0 percent impaired, limited or restricted Mobility: Walking and Moving Around Goal Status [phone](G8979): 0 percent impaired, limited or restricted Mobility: Walking and Moving Around Discharge Status [phone](G8980): 0 percent impaired, limited or restricted    Time: [id number] PT Time Calculation (min) (ACUTE ONLY): 20 min   Charges:   PT Evaluation $Initial PT Evaluation Tier I: 1 Procedure     PT G Codes:   PT G-Codes **NOT FOR INPATIENT CLASS** Functional Assessment Tool Used: assist level and clinical judgement Functional Limitation: Mobility: Walking and moving around Mobility: Walking and Moving Around Current Status (G3875(G8978): 0 percent impaired, limited or restricted Mobility: Walking and Moving Around Goal Status (I4332(G8979): 0 percent impaired, limited or restricted Mobility: Walking and Moving Around Discharge Status (R5188(G8980): 0 percent impaired, limited or restricted    Vista Deckarcell, [place] Russey Ann 03/29/2015, 2:29 PM

## 2015-03-30 LAB — FOLLICLE STIMULATING HORMONE: FSH: 2.4 m[IU]/mL (ref 1.5–12.4)

## 2015-03-30 LAB — LUTEINIZING HORMONE: LH: 2.8 m[IU]/mL (ref 1.7–8.6)

## 2015-03-30 LAB — HEMOGLOBIN A1C
HEMOGLOBIN A1C: 5.7 % — AB (ref 4.8–5.6)
MEAN PLASMA GLUCOSE: 117 mg/dL

## 2015-04-03 LAB — TESTOSTERONE, % FREE: TESTOSTERONE-% FREE: 0.6 % — AB (ref 0.2–0.7)

## 2015-04-05 LAB — VITAMIN D 1,25 DIHYDROXY
Vitamin D 1, 25 (OH)2 Total: <10 pg/mL
Vitamin D2 1, 25 (OH)2: <10 pg/mL

## 2015-04-05 LAB — HIV-1 RNA QUANT-NO REFLEX-BLD
HIV 1 RNA Quant: 110 copies/mL
LOG10 HIV-1 RNA: 2.041 {Log_copies}/mL

## 2015-04-12 ENCOUNTER — Encounter (HOSPITAL_COMMUNITY): Payer: Self-pay | Admitting: Emergency Medicine

## 2015-04-12 ENCOUNTER — Emergency Department (INDEPENDENT_AMBULATORY_CARE_PROVIDER_SITE_OTHER)
Admission: EM | Admit: 2015-04-12 | Discharge: 2015-04-12 | Disposition: A | Discharge status: Home / Self Care | Payer: Self-pay | Source: Home / Self Care | Attending: Family Medicine | Admitting: Family Medicine

## 2015-04-12 ENCOUNTER — Other Ambulatory Visit (HOSPITAL_COMMUNITY)
Admission: RE | Admit: 2015-04-12 | Discharge: 2015-04-12 | Disposition: A | Discharge status: Home / Self Care | Payer: MEDICAID | Source: Ambulatory Visit | Attending: Family Medicine | Admitting: Family Medicine

## 2015-04-12 DIAGNOSIS — Z711 Person with feared health complaint in whom no diagnosis is made: Secondary | ICD-10-CM

## 2015-04-12 DIAGNOSIS — Z113 Encounter for screening for infections with a predominantly sexual mode of transmission: Secondary | ICD-10-CM | POA: Insufficient documentation

## 2015-04-12 MED ORDER — CEFTRIAXONE SODIUM 250 MG IJ SOLR
INTRAMUSCULAR | Status: AC
  Filled 2015-04-12: qty 250

## 2015-04-12 MED ORDER — LIDOCAINE HCL (PF) 1 % IJ SOLN
INTRAMUSCULAR | Status: AC
Start: 2015-04-12 — End: 2015-04-12
  Filled 2015-04-12: qty 5

## 2015-04-12 MED ORDER — CEFTRIAXONE SODIUM 250 MG IJ SOLR
250.0000 mg | Freq: Once | INTRAMUSCULAR | Status: AC
  Administered 2015-04-12: 250 mg via INTRAMUSCULAR

## 2015-04-12 MED ORDER — AZITHROMYCIN 250 MG PO TABS
1000.0000 mg | ORAL_TABLET | Freq: Once | ORAL | Status: AC
  Administered 2015-04-12: 1000 mg via ORAL

## 2015-04-12 MED ORDER — AZITHROMYCIN 250 MG PO TABS
ORAL_TABLET | ORAL | Status: AC
  Filled 2015-04-12: qty 4

## 2015-04-12 NOTE — Discharge Instructions (Signed)
We will call with test results and treat as indicated °

## 2015-04-12 NOTE — ED Notes (Signed)
Burning with urination for 2 days.  Patient said "i dont know" when asked if he had a discharge.  Patient had tib/fib surgery on July 16 or 17

## 2015-04-12 NOTE — ED Provider Notes (Signed)
CSN: [id number]     Arrival date & time 04/12/15  1301 History   First MD Initiated Contact with Patient 04/12/15 1316     Chief Complaint  Patient presents with  . Dysuria   (Consider location/radiation/quality/duration/timing/severity/associated sxs/prior Treatment) Patient is a 29 y.o. male presenting with dysuria. The history is provided by the patient.  Dysuria This is a new problem. The current episode started 2 days ago. The problem has been gradually worsening. Associated symptoms comments: Urethral d/c. H/o similar 1 mo ago..    Past Medical History  Diagnosis Date  . Hypertension   . HIV disease   . Marijuana use    Past Surgical History  Procedure Laterality Date  . No past surgeries    . Orif tibia plateau Left 03/28/2015    Procedure: OPEN REDUCTION INTERNAL FIXATION (ORIF) LEFT TIBIAL PLATEAU FRACTURE;  Surgeon: Myrene Galas, MD;  Location: [facility] OR;  Service: Orthopedics;  Laterality: Left;   Family History  Problem Relation Age of Onset  . Kidney disease Mother   . Kidney disease Father    History  Substance Use Topics  . Smoking status: Current Every Day Smoker -- 1.00 packs/day for 15 years    Types: Cigarettes  . Smokeless tobacco: Never Used     Comment: not smioking now due to cough  . Alcohol Use: 0.0 oz/week    0 Standard drinks or equivalent per week     Comment: rarely [name]    Review of Systems  Constitutional: Negative.   Genitourinary: Positive for dysuria and discharge.    Allergies  Review of patient's allergies indicates no known allergies.  Home Medications   Prior to Admission medications   Medication Sig Start Date End Date Taking? Authorizing Provider  amLODipine (NORVASC) 5 MG tablet Take 5 mg by mouth daily.    Historical Provider, MD  aspirin EC 325 MG tablet Take 1 tablet (325 mg total) by mouth every 12 (twelve) hours. 03/29/15   [name], PA-C  cholecalciferol 1000 UNITS tablet Take 1 tablet (1,000 Units total) by mouth  every 12 (twelve) hours. 03/29/15   [name], PA-C  Emtricitab-Rilpivir-Tenofovir 200-25-300 MG TABS Take 1 tablet by mouth daily. 04/30/14   Cliffton Asters, MD  vitamin C (VITAMIN C) 1000 MG tablet Take 1 tablet (1,000 mg total) by mouth daily. 03/29/15   [name], PA-C  Vitamin D, Ergocalciferol, (DRISDOL) 50000 UNITS CAPS capsule Take 1 capsule (50,000 Units total) by mouth every 7 (seven) days. 03/29/15   [name], PA-C   BP 128/78 mmHg  Pulse 94  Temp(Src) 98.3 F (36.8 C) (Oral)  SpO2 99% Physical Exam  Constitutional: He is oriented to person, place, and time. He appears well-developed and well-nourished.  Abdominal: Soft. Bowel sounds are normal.  Genitourinary: Testes normal. Right testis shows no tenderness. Left testis shows no tenderness. Circumcised. Penile tenderness present. Discharge found.  Neurological: He is alert and oriented to person, place, and time.  Skin: Skin is warm and dry.  Nursing note and vitals reviewed.   ED Course  Procedures (including critical care time) Labs Review Labs Reviewed  CYTOLOGY, (ORAL, ANAL, URETHRAL) ANCILLARY ONLY    Imaging Review No results found.   MDM   1. Concern about STD in male without diagnosis       Linna Hoff, MD 04/12/15 1343

## 2015-04-15 LAB — CYTOLOGY, (ORAL, ANAL, URETHRAL) ANCILLARY ONLY
CHLAMYDIA, DNA PROBE: NEGATIVE
NEISSERIA GONORRHEA: POSITIVE — AB

## 2015-04-15 NOTE — ED Notes (Signed)
Final review of STD reported positive for GC. Called patient, discussed positive findings. Has already been treated, and has already notified partner so they may be treated as well. Form 2124 [employer]HS completed and faxed to [facility] for their records

## 2015-04-24 ENCOUNTER — Other Ambulatory Visit: Payer: Self-pay | Admitting: Infectious Diseases

## 2015-05-13 ENCOUNTER — Ambulatory Visit: Payer: Self-pay | Admitting: Internal Medicine

## 2015-05-21 ENCOUNTER — Ambulatory Visit: Payer: Self-pay

## 2015-05-28 ENCOUNTER — Ambulatory Visit (INDEPENDENT_AMBULATORY_CARE_PROVIDER_SITE_OTHER): Payer: Self-pay | Admitting: *Deleted

## 2015-05-28 ENCOUNTER — Inpatient Hospital Stay (HOSPITAL_COMMUNITY): Payer: Self-pay

## 2015-05-28 ENCOUNTER — Emergency Department (HOSPITAL_COMMUNITY): Payer: Self-pay

## 2015-05-28 ENCOUNTER — Other Ambulatory Visit: Payer: Self-pay

## 2015-05-28 ENCOUNTER — Inpatient Hospital Stay (HOSPITAL_COMMUNITY)
Admission: EM | Admit: 2015-05-28 | Discharge: 2015-05-31 | DRG: 977 | Discharge status: Against Medical Advice | Payer: Self-pay | Attending: Internal Medicine | Admitting: Internal Medicine

## 2015-05-28 ENCOUNTER — Encounter (HOSPITAL_COMMUNITY): Payer: Self-pay | Admitting: Emergency Medicine

## 2015-05-28 DIAGNOSIS — F17210 Nicotine dependence, cigarettes, uncomplicated: Secondary | ICD-10-CM | POA: Diagnosis present

## 2015-05-28 DIAGNOSIS — B20 Human immunodeficiency virus [HIV] disease: Secondary | ICD-10-CM

## 2015-05-28 DIAGNOSIS — Z23 Encounter for immunization: Secondary | ICD-10-CM

## 2015-05-28 DIAGNOSIS — F1290 Cannabis use, unspecified, uncomplicated: Secondary | ICD-10-CM | POA: Diagnosis present

## 2015-05-28 DIAGNOSIS — R590 Localized enlarged lymph nodes: Principal | ICD-10-CM | POA: Diagnosis present

## 2015-05-28 DIAGNOSIS — I10 Essential (primary) hypertension: Secondary | ICD-10-CM | POA: Diagnosis present

## 2015-05-28 DIAGNOSIS — Z79899 Other long term (current) drug therapy: Secondary | ICD-10-CM

## 2015-05-28 DIAGNOSIS — Z8614 Personal history of Methicillin resistant Staphylococcus aureus infection: Secondary | ICD-10-CM

## 2015-05-28 DIAGNOSIS — D72829 Elevated white blood cell count, unspecified: Secondary | ICD-10-CM | POA: Diagnosis present

## 2015-05-28 DIAGNOSIS — Z113 Encounter for screening for infections with a predominantly sexual mode of transmission: Secondary | ICD-10-CM

## 2015-05-28 LAB — CBC WITH DIFFERENTIAL/PLATELET
Basophils Absolute: 0 10*3/uL (ref 0.0–0.1)
Basophils Relative: 0 % (ref 0–1)
Eosinophils Absolute: 0.2 10*3/uL (ref 0.0–0.7)
Eosinophils Relative: 2 % (ref 0–5)
HCT: 36.9 % — ABNORMAL LOW (ref 39.0–52.0)
Hemoglobin: 11.7 g/dL — ABNORMAL LOW (ref 13.0–17.0)
Lymphocytes Relative: 13 % (ref 12–46)
Lymphs Abs: 1.7 10*3/uL (ref 0.7–4.0)
MCH: 27 pg (ref 26.0–34.0)
MCHC: 31.7 g/dL (ref 30.0–36.0)
MCV: 85.2 fL (ref 78.0–100.0)
Monocytes Absolute: 0.6 10*3/uL (ref 0.1–1.0)
Monocytes Relative: 4 % (ref 3–12)
Neutro Abs: 10.6 10*3/uL — ABNORMAL HIGH (ref 1.7–7.7)
Neutrophils Relative %: 81 % — ABNORMAL HIGH (ref 43–77)
Platelets: 223 10*3/uL (ref 150–400)
RBC: 4.33 MIL/uL (ref 4.22–5.81)
RDW: 14.2 % (ref 11.5–15.5)
WBC: 13.1 10*3/uL — ABNORMAL HIGH (ref 4.0–10.5)

## 2015-05-28 LAB — URINALYSIS, ROUTINE W REFLEX MICROSCOPIC
Bilirubin Urine: NEGATIVE
Glucose, UA: NEGATIVE mg/dL
Hgb urine dipstick: NEGATIVE
Ketones, ur: NEGATIVE mg/dL
Leukocytes, UA: NEGATIVE
Nitrite: NEGATIVE
Protein, ur: NEGATIVE mg/dL
Specific Gravity, Urine: 1.025 (ref 1.005–1.030)
Urobilinogen, UA: 0.2 mg/dL (ref 0.0–1.0)
pH: 5.5 (ref 5.0–8.0)

## 2015-05-28 LAB — COMPREHENSIVE METABOLIC PANEL
ALT: 26 U/L (ref 17–63)
AST: 26 U/L (ref 15–41)
Albumin: 4.3 g/dL (ref 3.5–5.0)
Alkaline Phosphatase: 88 U/L (ref 38–126)
Anion gap: 8 (ref 5–15)
BUN: 8 mg/dL (ref 6–20)
CO2: 26 mmol/L (ref 22–32)
Calcium: 9.1 mg/dL (ref 8.9–10.3)
Chloride: 107 mmol/L (ref 101–111)
Creatinine, Ser: 0.98 mg/dL (ref 0.61–1.24)
GFR calc Af Amer: >60 mL/min (ref >60)
GFR calc non Af Amer: >60 mL/min (ref >60)
Glucose, Bld: 94 mg/dL (ref 65–99)
Potassium: 3.7 mmol/L (ref 3.5–5.1)
Sodium: 141 mmol/L (ref 135–145)
Total Bilirubin: 0.4 mg/dL (ref 0.3–1.2)
Total Protein: 8.3 g/dL — ABNORMAL HIGH (ref 6.5–8.1)

## 2015-05-28 LAB — I-STAT CG4 LACTIC ACID, ED: Lactic Acid, Venous: 1.97 mmol/L (ref 0.5–2.0)

## 2015-05-28 LAB — RAPID URINE DRUG SCREEN, HOSP PERFORMED
AMPHETAMINES: NOT DETECTED
BARBITURATES: NOT DETECTED
Benzodiazepines: NOT DETECTED
Cocaine: NOT DETECTED
Opiates: NOT DETECTED
TETRAHYDROCANNABINOL: POSITIVE — AB

## 2015-05-28 LAB — MAGNESIUM: MAGNESIUM: 1.9 mg/dL (ref 1.7–2.4)

## 2015-05-28 MED ORDER — ACETAMINOPHEN 325 MG PO TABS
650.0000 mg | ORAL_TABLET | Freq: Four times a day (QID) | ORAL | Status: DC | PRN
  Administered 2015-05-28 – 2015-05-30 (×4): 650 mg via ORAL
  Filled 2015-05-28 (×4): qty 2

## 2015-05-28 MED ORDER — IOHEXOL 300 MG/ML  SOLN
25.0000 mL | Freq: Once | INTRAMUSCULAR | Status: AC | PRN
  Administered 2015-05-28: 25 mL via ORAL

## 2015-05-28 MED ORDER — SODIUM CHLORIDE 0.9 % IV SOLN
INTRAVENOUS | Status: DC
  Administered 2015-05-29 – 2015-05-30 (×4): via INTRAVENOUS

## 2015-05-28 MED ORDER — LEVALBUTEROL HCL 0.63 MG/3ML IN NEBU: 0.6300 mg | INHALATION_SOLUTION | Freq: Four times a day (QID) | RESPIRATORY_TRACT | Status: DC | PRN

## 2015-05-28 MED ORDER — ENOXAPARIN SODIUM 40 MG/0.4ML ~~LOC~~ SOLN
40.0000 mg | Freq: Every day | SUBCUTANEOUS | Status: DC
  Administered 2015-05-28 – 2015-05-30 (×3): 40 mg via SUBCUTANEOUS
  Filled 2015-05-28 (×4): qty 0.4

## 2015-05-28 MED ORDER — HYDROMORPHONE HCL 1 MG/ML IJ SOLN
1.0000 mg | Freq: Once | INTRAMUSCULAR | Status: AC
  Administered 2015-05-28: 1 mg via INTRAVENOUS
  Filled 2015-05-28: qty 1

## 2015-05-28 MED ORDER — PIPERACILLIN-TAZOBACTAM 3.375 G IVPB
3.3750 g | Freq: Three times a day (TID) | INTRAVENOUS | Status: DC
  Administered 2015-05-29: 3.375 g via INTRAVENOUS
  Filled 2015-05-28 (×3): qty 50

## 2015-05-28 MED ORDER — CLINDAMYCIN PHOSPHATE 900 MG/50ML IV SOLN
900.0000 mg | Freq: Once | INTRAVENOUS | Status: AC
  Administered 2015-05-28: 900 mg via INTRAVENOUS
  Filled 2015-05-28: qty 50

## 2015-05-28 MED ORDER — AMLODIPINE BESYLATE 5 MG PO TABS
5.0000 mg | ORAL_TABLET | Freq: Every day | ORAL | Status: DC
  Administered 2015-05-29 – 2015-05-31 (×3): 5 mg via ORAL
  Filled 2015-05-28 (×3): qty 1

## 2015-05-28 MED ORDER — IOHEXOL 300 MG/ML  SOLN
100.0000 mL | Freq: Once | INTRAMUSCULAR | Status: AC | PRN
  Administered 2015-05-28: 100 mL via INTRAVENOUS

## 2015-05-28 MED ORDER — PIPERACILLIN-TAZOBACTAM 3.375 G IVPB 30 MIN
3.3750 g | INTRAVENOUS | Status: AC
Start: 2015-05-28 — End: 2015-05-28
  Administered 2015-05-28: 3.375 g via INTRAVENOUS
  Filled 2015-05-28: qty 50

## 2015-05-28 MED ORDER — ONDANSETRON HCL 4 MG/2ML IJ SOLN
4.0000 mg | Freq: Four times a day (QID) | INTRAMUSCULAR | Status: DC | PRN
Start: 2015-05-28 — End: 2015-05-31

## 2015-05-28 MED ORDER — ACETAMINOPHEN 650 MG RE SUPP: 650.0000 mg | Freq: Four times a day (QID) | RECTAL | Status: DC | PRN

## 2015-05-28 MED ORDER — OXYCODONE HCL 5 MG PO TABS
5.0000 mg | ORAL_TABLET | ORAL | Status: DC | PRN
  Administered 2015-05-28 – 2015-05-29 (×3): 5 mg via ORAL
  Filled 2015-05-28 (×3): qty 1

## 2015-05-28 MED ORDER — SODIUM CHLORIDE 0.9 % IV SOLN
Freq: Once | INTRAVENOUS | Status: AC
  Administered 2015-05-28: 20:00:00 via INTRAVENOUS

## 2015-05-28 MED ORDER — ONDANSETRON HCL 4 MG PO TABS: 4.0000 mg | ORAL_TABLET | Freq: Four times a day (QID) | ORAL | Status: DC | PRN

## 2015-05-28 MED ORDER — FENTANYL CITRATE (PF) 100 MCG/2ML IJ SOLN
100.0000 ug | Freq: Once | INTRAMUSCULAR | Status: AC
  Administered 2015-05-28: 100 ug via INTRAVENOUS
  Filled 2015-05-28: qty 2

## 2015-05-28 MED ORDER — ASPIRIN EC 325 MG PO TBEC
325.0000 mg | DELAYED_RELEASE_TABLET | Freq: Two times a day (BID) | ORAL | Status: DC
  Administered 2015-05-28 – 2015-05-31 (×6): 325 mg via ORAL
  Filled 2015-05-28 (×7): qty 1

## 2015-05-28 MED ORDER — EMTRICITAB-RILPIVIR-TENOFOV DF 200-25-300 MG PO TABS: 1.0000 | ORAL_TABLET | Freq: Every day | ORAL | Status: DC

## 2015-05-28 MED ORDER — VANCOMYCIN HCL IN DEXTROSE 1-5 GM/200ML-% IV SOLN: 1000.0000 mg | Freq: Once | INTRAVENOUS | Status: DC

## 2015-05-28 NOTE — ED Notes (Signed)
Pt back from CT

## 2015-05-28 NOTE — ED Provider Notes (Signed)
Medical screening examination/treatment/procedure(s) were performed by non-physician practitioner and as supervising physician I was immediately available for consultation/collaboration.   EKG Interpretation None     Patient here with left-sided groin pain 4 days. He is febrile here. CT results reviewed. Likely abscess. Antibiotics initiated with counseled general surgery as well as admit to medicine  Lorre Nick, MD 05/28/15 2141

## 2015-05-28 NOTE — ED Notes (Signed)
Two unsuccessful blood draw attempts  

## 2015-05-28 NOTE — ED Provider Notes (Signed)
CSN: [id number]     Arrival date & time 05/28/15  1130 History   First MD Initiated Contact with Patient 05/28/15 1546     Chief Complaint  Patient presents with  . Left Groin Pain      (Consider location/radiation/quality/duration/timing/severity/associated sxs/prior Treatment) HPI   Clifford Shields is a 29 yo male with a PMH of HTN and HIV who presents with left groin pain. He first noticed a dull throb in his left groin 4 days ago. He describes the pain as constant, throbbing, sharpe in nature. It has gotten progressively worse. He notices the pain worst with sudden movements. He recently had surgery for a broken tibia/ fibula in July of this year. He states this groin pain is masking the pain he feels from his leg. He admits to feeling more fatigued than usual the past couple of days. He denies any recent fever, chills, night sweats, N/V, penile discharge.   Past Medical History  Diagnosis Date  . Hypertension   . HIV disease   . Marijuana use    Past Surgical History  Procedure Laterality Date  . No past surgeries    . Orif tibia plateau Left 03/28/2015    Procedure: OPEN REDUCTION INTERNAL FIXATION (ORIF) LEFT TIBIAL PLATEAU FRACTURE;  Surgeon: Myrene Galas, MD;  Location: [facility] OR;  Service: Orthopedics;  Laterality: Left;   Family History  Problem Relation Age of Onset  . Kidney disease Mother   . Kidney disease Father    Social History  Substance Use Topics  . Smoking status: Current Every Day Smoker -- 1.00 packs/day for 15 years    Types: Cigarettes  . Smokeless tobacco: Never Used     Comment: not smioking now due to cough  . Alcohol Use: 0.0 oz/week    0 Standard drinks or equivalent per week     Comment: rarely [name]    Review of Systems  All other systems negative except as documented in the HPI. All pertinent positives and negatives as reviewed in the HPI.  Allergies  Review of patient's allergies indicates no known allergies.  Home Medications    Prior to Admission medications   Medication Sig Start Date End Date Taking? Authorizing Provider  amLODipine (NORVASC) 5 MG tablet Take 5 mg by mouth daily.    Historical Provider, MD  amLODipine (NORVASC) 5 MG tablet TAKE 1 TABLET BY MOUTH DAILY 04/24/15   Ginnie Smart, MD  aspirin EC 325 MG tablet Take 1 tablet (325 mg total) by mouth every 12 (twelve) hours. 03/29/15   [name], PA-C  cholecalciferol 1000 UNITS tablet Take 1 tablet (1,000 Units total) by mouth every 12 (twelve) hours. 03/29/15   [name], PA-C  COMPLERA 200-25-300 MG TABS TAKE 1 TABLET BY MOUTH DAILY 04/24/15   Ginnie Smart, MD  Emtricitab-Rilpivir-Tenofovir 200-25-300 MG TABS Take 1 tablet by mouth daily. 04/30/14   Cliffton Asters, MD  vitamin C (VITAMIN C) 1000 MG tablet Take 1 tablet (1,000 mg total) by mouth daily. 03/29/15   [name], PA-C  Vitamin D, Ergocalciferol, (DRISDOL) 50000 UNITS CAPS capsule Take 1 capsule (50,000 Units total) by mouth every 7 (seven) days. 03/29/15   [name], PA-C   BP 151/94 mmHg  Pulse 101  Temp(Src) 101.8 F (38.8 C) (Oral)  Resp 17  SpO2 100% Physical Exam  Constitutional: He is oriented to person, place, and time. He appears well-developed and well-nourished. No distress.  HENT:  Head: Normocephalic and atraumatic.  Eyes: Pupils are  equal, round, and reactive to light.  Neck: Normal range of motion. Neck supple.  Cardiovascular: Normal rate, regular rhythm and normal heart sounds.  Exam reveals no gallop and no friction rub.   No murmur heard. Pulmonary/Chest: Effort normal and breath sounds normal. No respiratory distress.  Genitourinary: Penis normal.     Lymphadenopathy:    He has no cervical adenopathy.       Left: Inguinal adenopathy present.  Neurological: He is alert and oriented to person, place, and time. He exhibits normal muscle tone. Coordination normal.  Skin: Skin is warm and dry. No erythema.  Psychiatric: He has a normal mood and affect. His  behavior is normal.  Nursing note and vitals reviewed.   ED Course  Procedures (including critical care time) Labs Review Labs Reviewed  CBC WITH DIFFERENTIAL/PLATELET  COMPREHENSIVE METABOLIC PANEL  URINALYSIS, ROUTINE W REFLEX MICROSCOPIC (NOT AT [facility])    Imaging Review No results found. I have personally reviewed and evaluated these images and lab results as part of my medical decision-making.  The patient will be admitted to the hospital for IV antibiosis spoke with general surgery.  Dr. Johna Sheriff, and the [facility].  The patient is started on IV clindamycin   Charlestine Night, PA-C 05/31/15 0201

## 2015-05-28 NOTE — H&P (Signed)
[facility] History and Physical  Nayshawn Mesta ZOX:[id number] DOB: 1985/11/30 DOA: 05/28/2015  Referring physician:   PCP: No PCP Per Patient   Chief Complaint: Left Groin Pain   HPI:  29 yo male with a PMH of HTN and HIV who presents with left groin pain. He first noticed a dull throb in his left groin 4 days ago. He describes the pain as constant, throbbing, sharpe in nature. It has gotten progressively worse. He notices the pain worst with sudden movements. He recently had surgery for a broken tibia/ fibula in July of this year.  He admits to feeling more fatigued than usual the past couple of days. He denies any recent fever, chills, night sweats, N/V, penile discharge. No cough ,CP,SOB. Followed by [facility] for his HIV , MD,He was diagnosed in 2009 and has been out of care for long time. His  recent labs show good compliance with a CD4 count of 520 an undetectable viral load.     Review of Systems: negative for the following  Constitutional: Denies fever, chills, diaphoresis, appetite change and fatigue.  HEENT: Denies photophobia, eye pain, redness, hearing loss, ear pain, congestion, sore throat, rhinorrhea, sneezing, mouth sores, trouble swallowing, neck pain, neck stiffness and tinnitus.  Respiratory: Denies SOB, DOE, cough, chest tightness, and wheezing.  Cardiovascular: Denies chest pain, palpitations and leg swelling.  Gastrointestinal: Denies nausea, vomiting, abdominal pain, diarrhea, constipation, blood in stool and abdominal distention.  Genitourinary: Denies dysuria, urgency, frequency, hematuria,left groin pain,  No difficulty urinating.  Musculoskeletal: Denies myalgias, back pain, joint swelling, arthralgias and gait problem.  Skin: Denies pallor, rash and wound.  Neurological: Denies dizziness, seizures, syncope, weakness, light-headedness, numbness and headaches.  Hematological: Denies adenopathy. Easy bruising, personal or family bleeding history   Psychiatric/Behavioral: Denies suicidal ideation, mood changes, confusion, nervousness, sleep disturbance and agitation       Past Medical History  Diagnosis Date  . Hypertension   . HIV disease   . Marijuana use      Past Surgical History  Procedure Laterality Date  . No past surgeries    . Orif tibia plateau Left 03/28/2015    Procedure: OPEN REDUCTION INTERNAL FIXATION (ORIF) LEFT TIBIAL PLATEAU FRACTURE;  Surgeon: Myrene Galas, MD;  Location: [facility] OR;  Service: Orthopedics;  Laterality: Left;      Social History:  reports that he has been smoking Cigarettes.  He has a 15 pack-year smoking history. He has never used smokeless tobacco. He reports that he drinks alcohol. He reports that he uses illicit drugs (Marijuana) about 14 times per week.    No Known Allergies  Family History  Problem Relation Age of Onset  . Kidney disease Mother   . Kidney disease Father       FAMILY HISTORY  When questioned  Directly-patient reports  No family history of HTN, CVA ,DIABETES, TB, Cancer CAD, Bleeding Disorders, Sickle Cell, diabetes, anemia, asthma,   Prior to Admission medications   Medication Sig Start Date End Date Taking? Authorizing Provider  amLODipine (NORVASC) 5 MG tablet TAKE 1 TABLET BY MOUTH DAILY 04/24/15  Yes Ginnie Smart, MD  cholecalciferol 1000 UNITS tablet Take 1 tablet (1,000 Units total) by mouth every 12 (twelve) hours. 03/29/15  Yes [name], PA-C  COMPLERA 200-25-300 MG TABS TAKE 1 TABLET BY MOUTH DAILY 04/24/15  Yes Ginnie Smart, MD  diphenhydrAMINE (BENADRYL) 12.5 MG/5ML liquid Take 25 mg by mouth 4 (four) times daily as needed.   Yes Historical  Provider, MD  aspirin EC 325 MG tablet Take 1 tablet (325 mg total) by mouth every 12 (twelve) hours. Patient not taking: Reported on 05/28/2015 03/29/15   [name], PA-C  Emtricitab-Rilpivir-Tenofovir 200-25-300 MG TABS Take 1 tablet by mouth daily. Patient not taking: Reported on 05/28/2015 04/30/14   Cliffton Asters, MD  vitamin C (VITAMIN C) 1000 MG tablet Take 1 tablet (1,000 mg total) by mouth daily. Patient not taking: Reported on 05/28/2015 03/29/15   [name], PA-C  Vitamin D, Ergocalciferol, (DRISDOL) 50000 UNITS CAPS capsule Take 1 capsule (50,000 Units total) by mouth every 7 (seven) days. Patient not taking: Reported on 05/28/2015 03/29/15   [name], PA-C     Physical Exam: Filed Vitals:   05/28/15 1830 05/28/15 1842 05/28/15 1900 05/28/15 2108  BP: 154/96  152/88 136/77  Pulse: 85  90 94  Temp:  98.1 F (36.7 C)  99 F (37.2 C)  TempSrc:  Oral  Oral  Resp:   16 18  SpO2: 100%  100% 99%     Constitutional: Vital signs reviewed. Patient is a well-developed and well-nourished in no acute distress and cooperative with exam. Alert and oriented x3.  Head: Normocephalic and atraumatic  Ear: TM normal bilaterally  Mouth: no erythema or exudates, MMM  Eyes: PERRL, EOMI, conjunctivae normal, No scleral icterus.  Neck: Supple, Trachea midline normal ROM, No JVD, mass, thyromegaly, or carotid bruit present.  Cardiovascular: RRR, S1 normal, S2 normal, no MRG, pulses symmetric and intact bilaterally  Pulmonary/Chest: CTAB, no wheezes, rales, or rhonchi  Abdominal: Soft. Non-tender, non-distended, bowel sounds are normal, no masses, organomegaly, or guarding present.  GU: Left: Inguinal adenopathy present Musculoskeletal: No joint deformities, erythema, or stiffness, ROM full and no nontender Ext: no edema and no cyanosis, pulses palpable bilaterally (DP and PT)  Hematology: no cervical, inginal, or axillary adenopathy.  Neurological: A&O x3, Strenght is normal and symmetric bilaterally, cranial nerve II-XII are grossly intact, no focal motor deficit, sensory intact to light touch bilaterally.  Skin: Warm, dry and intact. No rash, cyanosis, or clubbing.  Psychiatric: Normal mood and affect. speech and behavior is normal. Judgment and thought content normal. Cognition and memory are  normal.      Data Review   Micro Results No results found for this or any previous visit (from the past 240 hour(s)).  Radiology Reports Ct Abdomen Pelvis W Contrast  05/28/2015   CLINICAL DATA:  Patient with sharp pain in the left groin region. Feels this is related to a hernia.  EXAM: CT ABDOMEN AND PELVIS WITH CONTRAST  TECHNIQUE: Multidetector CT imaging of the abdomen and pelvis was performed using the standard protocol following bolus administration of intravenous contrast.  CONTRAST:  25mL OMNIPAQUE IOHEXOL 300 MG/ML SOLN, OMNIPAQUE IOHEXOL 300 MG/ML SOLN  COMPARISON:  None.  FINDINGS: Lower chest: Normal heart size. No consolidative or nodular pulmonary opacities. No pleural effusion.  Hepatobiliary: The liver is normal in size and contour. No focal hepatic lesion is identified. Gallbladder is unremarkable. No intrahepatic or extrahepatic biliary ductal dilatation.  Pancreas: Unremarkable  Spleen: Unremarkable  Adrenals/Urinary Tract: Adrenal glands are normal. Kidneys enhance symmetrically with contrast. No hydronephrosis. The urinary bladder is unremarkable.  Stomach/Bowel: Colon is decompressed. No abnormal bowel wall thickening or evidence for bowel obstruction. The appendix is normal. No free fluid or free intraperitoneal air.  Vascular/Lymphatic: Normal caliber abdominal aorta. Multiple sub cm retroperitoneal lymph nodes are demonstrated. Enlarged left external iliac lymph nodes measuring 2.0 cm (  image 70; series 2) and 2.1 cm (image 72; series 2). Prominent left inguinal lymph nodes. Adenopathy causes narrowing of left femoral vein.  Other: Extensive soft tissue stranding within the left inguinal region.  Musculoskeletal: No aggressive or acute appearing osseous lesions.  IMPRESSION: Nonspecific soft tissue stranding within the left inguinal region image may be secondary to an infectious, inflammatory or malignant process. There are enlarged left external iliac lymph nodes measuring  up to 2.1 cm which may be secondary to an infectious, inflammatory or malignant process.   Electronically Signed   By: Annia Belt M.D.   On: 05/28/2015 18:27     CBC  Recent Labs Lab 05/28/15 1620  WBC 13.1*  HGB 11.7*  HCT 36.9*  PLT 223  MCV 85.2  MCH 27.0  MCHC 31.7  RDW 14.2  LYMPHSABS 1.7  MONOABS 0.6  EOSABS 0.2  BASOSABS 0.0    Chemistries   Recent Labs Lab 05/28/15 1620  NA 141  K 3.7  CL 107  CO2 26  GLUCOSE 94  BUN 8  CREATININE 0.98  CALCIUM 9.1  AST 26  ALT 26  ALKPHOS 88  BILITOT 0.4   ------------------------------------------------------------------------------------------------------------------ CrCl cannot be calculated (Unknown ideal weight.). ------------------------------------------------------------------------------------------------------------------ No results for input(s): HGBA1C in the last 72 hours. ------------------------------------------------------------------------------------------------------------------ No results for input(s): CHOL, HDL, LDLCALC, TRIG, CHOLHDL, LDLDIRECT in the last 72 hours. ------------------------------------------------------------------------------------------------------------------ No results for input(s): TSH, T4TOTAL, T3FREE, THYROIDAB in the last 72 hours.  Invalid input(s): FREET3 ------------------------------------------------------------------------------------------------------------------ No results for input(s): VITAMINB12, FOLATE, FERRITIN, TIBC, IRON, RETICCTPCT in the last 72 hours.  Coagulation profile No results for input(s): INR, PROTIME in the last 168 hours.  No results for input(s): DDIMER in the last 72 hours.  Cardiac Enzymes No results for input(s): CKMB, TROPONINI, MYOGLOBIN in the last 168 hours.  Invalid input(s): CK ------------------------------------------------------------------------------------------------------------------ Invalid input(s): POCBNP   CBG: No  results for input(s): GLUCAP in the last 168 hours.     EKG: Independently reviewed.   Assessment/Plan Active Problems:  left  Inguinal adenopathy-unclear etiology , CT reviewed by DR Hoxworth who called me and notified that this is not a strangulated hernia, not an acute surgical condition, they will consult in AM, admit to [facility] for iv antibiotics and reassess.UA negative,  Will check for chlamydia , gonorrhea,  HIV, continue antiretroviral rx , followed by Dr Luciana Axe    Leukocytosis : will evaluate further with CXR given hx of HIV  Code Status:   full Family Communication: bedside Disposition Plan: admit   Total time spent 55 minutes.Greater than 50% of this time was spent in counseling, explanation of diagnosis, planning of further management, and coordination of care  [facility] Pager [phone]  If 7PM-7AM, please contact night-coverage [email or web address] Password [facility] 05/28/2015, 10:15 PM

## 2015-05-28 NOTE — ED Notes (Signed)
Pt c/o possible hernia to left groin area x4 days. Rates pain 10/10.

## 2015-05-28 NOTE — Progress Notes (Addendum)
EDCM spoke to patient at bedside. Patient confirms he does not have a pcp or insurance living in [place].  [facility] provided patient with contact information to [facility], informed patient of services there .  EDCM also provided patient with list of pcps who accept self pay patients, list of discount pharmacies and websites needymeds.org and GoodRX.com for medication assistance, phone number to inquire about the orange card, phone number to inquire about Medic[facility], phone number to inquire about the Affordable Care Act, financial resources in the community such as local churches, [employer], urban ministries, and dental assistance for uninsured patients.  Patient thankful for resources.  No further EDCM needs at this time.  EDCM received patient's permission to place referral to [facility] for orange card.  [facility] referral placed.

## 2015-05-28 NOTE — ED Notes (Signed)
Pt to CT

## 2015-05-28 NOTE — Progress Notes (Signed)
Patient arrived for his Hepatitis A and B injections.  He is complaining of sharp pain in his left groin.  He states this is new, is intermittent, can spike to a 10/10.  He states he feels this is a hernia, caused by using crutches due to recent surgery on his left leg.  He denies fever, any trouble passing urine or stool. Visual inspection by the nurse shows no protrusions, no bruising.  Patient still wants his injections, agreed to have the influenza shot as well.  Pt had labs drawn, will schedule follow up with Dr. Luciana Axe.  Patient was advised to go to the ED for evaluation of the potential hernia, as this may need imaging or intervention.  He agreed. He left the clinic without distress. Andree Coss, RN

## 2015-05-28 NOTE — Progress Notes (Signed)
ANTIBIOTIC CONSULT NOTE - INITIAL  Pharmacy Consult for Zosyn Indication: Groin infection   No Known Allergies  Patient Measurements: Weight: 184 lb 15.5 oz (83.9 kg)   Vital Signs: Temp: 99 F (37.2 C) (09/13 2108) Temp Source: Oral (09/13 2108) BP: 136/77 mmHg (09/13 2108) Pulse Rate: 94 (09/13 2108) Intake/Output from previous day:   Intake/Output from this shift:    Labs:  Recent Labs  05/28/15 1620  WBC 13.1*  HGB 11.7*  PLT 223  CREATININE 0.98   Estimated Creatinine Clearance: 116.9 mL/min (by C-G formula based on Cr of 0.98). No results for input(s): VANCOTROUGH, VANCOPEAK, VANCORANDOM, GENTTROUGH, GENTPEAK, GENTRANDOM, TOBRATROUGH, TOBRAPEAK, TOBRARND, AMIKACINPEAK, AMIKACINTROU, AMIKACIN in the last 72 hours.   Microbiology: No results found for this or any previous visit (from the past 720 hour(s)).  Medical History: Past Medical History  Diagnosis Date  . Hypertension   . HIV disease   . Marijuana use     Assessment:  29 yr old with c/o left groin pain.  CT shows likely abscess  Temp = 99, WBC = 13.1  Blood cultures ordered  Pharmacy consulted to dose Zosyn for groin infection   Goal of Therapy:  Eradication of infection  Plan:  Follow up culture results  Zosyn 3.375gm IV q8h (each dose infused over 4 hrs)  Camry Theiss, Joselyn Glassman, PharmD 05/28/2015,10:18 PM

## 2015-05-28 NOTE — ED Notes (Signed)
PA student at bedside  Pt alert and oriented x4. Respirations even and unlabored, bilateral symmetrical rise and fall of chest. Skin warm and dry. In no acute distress. Denies needs.

## 2015-05-28 NOTE — ED Notes (Signed)
Unable to collect labs at this time PA working with patient.

## 2015-05-29 ENCOUNTER — Ambulatory Visit: Payer: Self-pay | Admitting: Internal Medicine

## 2015-05-29 ENCOUNTER — Encounter (HOSPITAL_COMMUNITY): Payer: Self-pay | Admitting: General Surgery

## 2015-05-29 DIAGNOSIS — R599 Enlarged lymph nodes, unspecified: Secondary | ICD-10-CM

## 2015-05-29 LAB — COMPREHENSIVE METABOLIC PANEL
ALBUMIN: 3.7 g/dL (ref 3.5–5.0)
ALT: 20 U/L (ref 17–63)
AST: 20 U/L (ref 15–41)
Alkaline Phosphatase: 76 U/L (ref 38–126)
Anion gap: 8 (ref 5–15)
BILIRUBIN TOTAL: 0.6 mg/dL (ref 0.3–1.2)
BUN: 9 mg/dL (ref 6–20)
CHLORIDE: 103 mmol/L (ref 101–111)
CO2: 27 mmol/L (ref 22–32)
Calcium: 8.4 mg/dL — ABNORMAL LOW (ref 8.9–10.3)
Creatinine, Ser: 1.1 mg/dL (ref 0.61–1.24)
GFR calc Af Amer: >60 mL/min (ref >60)
GFR calc non Af Amer: >60 mL/min (ref >60)
GLUCOSE: 88 mg/dL (ref 65–99)
POTASSIUM: 3.5 mmol/L (ref 3.5–5.1)
Sodium: 138 mmol/L (ref 135–145)
Total Protein: 7.7 g/dL (ref 6.5–8.1)

## 2015-05-29 LAB — HIV-1 RNA QUANT-NO REFLEX-BLD
HIV 1 RNA Quant: <20 copies/mL (ref <20)
HIV-1 RNA Quant, Log: <1.3 {Log} (ref <1.30)

## 2015-05-29 LAB — CBC
HEMATOCRIT: 37.2 % — AB (ref 39.0–52.0)
Hemoglobin: 11.7 g/dL — ABNORMAL LOW (ref 13.0–17.0)
MCH: 27 pg (ref 26.0–34.0)
MCHC: 31.5 g/dL (ref 30.0–36.0)
MCV: 85.7 fL (ref 78.0–100.0)
PLATELETS: 223 10*3/uL (ref 150–400)
RBC: 4.34 MIL/uL (ref 4.22–5.81)
RDW: 14.2 % (ref 11.5–15.5)
WBC: 12.7 10*3/uL — AB (ref 4.0–10.5)

## 2015-05-29 LAB — T-HELPER CELL (CD4) - (RCID CLINIC ONLY)
CD4 % Helper T Cell: 28 % — ABNORMAL LOW (ref 33–55)
CD4 T Cell Abs: 610 /uL (ref 400–2700)

## 2015-05-29 LAB — LACTIC ACID, PLASMA: LACTIC ACID, VENOUS: 0.9 mmol/L (ref 0.5–2.0)

## 2015-05-29 MED ORDER — EMTRICITABINE-TENOFOVIR DF 200-300 MG PO TABS
1.0000 | ORAL_TABLET | Freq: Every day | ORAL | Status: DC
  Administered 2015-05-29 – 2015-05-31 (×3): 1 via ORAL
  Filled 2015-05-29 (×3): qty 1

## 2015-05-29 MED ORDER — AMPICILLIN-SULBACTAM SODIUM 1.5 (1-0.5) G IJ SOLR
1.5000 g | Freq: Four times a day (QID) | INTRAMUSCULAR | Status: DC
  Administered 2015-05-29 – 2015-05-31 (×8): 1.5 g via INTRAVENOUS
  Filled 2015-05-29 (×9): qty 1.5

## 2015-05-29 MED ORDER — METHOCARBAMOL 500 MG PO TABS: 1000.0000 mg | ORAL_TABLET | Freq: Three times a day (TID) | ORAL | Status: DC | PRN

## 2015-05-29 MED ORDER — OXYCODONE HCL 5 MG PO TABS
5.0000 mg | ORAL_TABLET | ORAL | Status: DC | PRN
  Administered 2015-05-29 – 2015-05-31 (×8): 10 mg via ORAL
  Filled 2015-05-29 (×8): qty 2

## 2015-05-29 MED ORDER — CLINDAMYCIN PHOSPHATE 900 MG/50ML IV SOLN
900.0000 mg | Freq: Three times a day (TID) | INTRAVENOUS | Status: AC
  Administered 2015-05-29 – 2015-05-30 (×3): 900 mg via INTRAVENOUS
  Filled 2015-05-29 (×3): qty 50

## 2015-05-29 MED ORDER — RILPIVIRINE HCL 25 MG PO TABS
25.0000 mg | ORAL_TABLET | Freq: Every day | ORAL | Status: DC
  Administered 2015-05-29 – 2015-05-31 (×3): 25 mg via ORAL
  Filled 2015-05-29 (×4): qty 1

## 2015-05-29 NOTE — Progress Notes (Signed)
Page hospitalist to make him aware pt was not getting any relief from pain meds prescribed. Also made him aware that both patient and night nurse reported the inguinal mass has grown since admission. Awaiting orders from doctor. Will continue to monitor patient.

## 2015-05-29 NOTE — Consult Note (Signed)
[facility] Surgery Consult Note  Caliph Borowiak 1986-05-28  [id number].    Requesting MD: Dr. Allyson Sabal Chief Complaint/Reason for Consult: Left inguinal adenopathy  HPI:  Mr. Dutter is a 29 y/o AA male with a PMH of HIV and HTN who presented to the ED 9/13 with left groin pain. He says that this has never happened before. He states that 5 days ago he noticed the left side of his groin become more "sensitive". He currently describes the pain as sharp and throbbing. He states that nothing makes the pain better and movement makes it worse. He denies recent trauma or injury to the area. He states that in the past 24 hours his left groin has become increasingly swollen.He denies recent illness, but reports surgery for a broken tib/fib in July. He states that about a week and a half ago he started mobilizing on his leg. He denies any current PT for financial reasons.  The patient endorses chills, night sweats, and fever beginning yesterday. He denies changes in hearing/vision, CP, SOB, N/V/D, hematochezia, dysuria, hematuria, penile discharge, and swelling or pain in his extremities, including his surgical site.  The patient is a current 1/2 ppd smoker x 12 years. He drinks alcohol once monthly. He smokes about "3 blunts" of marijuana daily. Full time student at [employer]. He is sexually active with his partner, Larkin Ina, who was on the phone with him during my H&P.   ROS: All systems reviewed and otherwise negative except for as above  Family History  Problem Relation Age of Onset  . Kidney disease Mother   . Kidney disease Father     Past Medical History  Diagnosis Date  . Hypertension   . HIV disease   . Marijuana use     Past Surgical History  Procedure Laterality Date  . Orif tibia plateau Left 03/28/2015    Procedure: OPEN REDUCTION INTERNAL FIXATION (ORIF) LEFT TIBIAL PLATEAU FRACTURE;  Surgeon: Altamese Dresser, MD;  Location: [place];  Service: Orthopedics;  Laterality: Left;     Social History:  reports that he has been smoking Cigarettes.  He has a 15 pack-year smoking history. He has never used smokeless tobacco. He reports that he drinks alcohol. He reports that he uses illicit drugs (Marijuana) about 14 times per week.  Allergies: No Known Allergies  Medications Prior to Admission  Medication Sig Dispense Refill  . amLODipine (NORVASC) 5 MG tablet TAKE 1 TABLET BY MOUTH DAILY 30 tablet 0  . cholecalciferol 1000 UNITS tablet Take 1 tablet (1,000 Units total) by mouth every 12 (twelve) hours. 90 tablet 4  . COMPLERA 200-25-300 MG TABS TAKE 1 TABLET BY MOUTH DAILY 30 tablet 0  . diphenhydrAMINE (BENADRYL) 12.5 MG/5ML liquid Take 25 mg by mouth 4 (four) times daily as needed.    Marland Kitchen aspirin EC 325 MG tablet Take 1 tablet (325 mg total) by mouth every 12 (twelve) hours. (Patient not taking: Reported on 05/28/2015) 30 tablet 0  . Emtricitab-Rilpivir-Tenofovir 200-25-300 MG TABS Take 1 tablet by mouth daily. (Patient not taking: Reported on 05/28/2015) 30 tablet 5  . vitamin C (VITAMIN C) 1000 MG tablet Take 1 tablet (1,000 mg total) by mouth daily. (Patient not taking: Reported on 05/28/2015) 60 tablet 0  . Vitamin D, Ergocalciferol, (DRISDOL) 50000 UNITS CAPS capsule Take 1 capsule (50,000 Units total) by mouth every 7 (seven) days. (Patient not taking: Reported on 05/28/2015) 12 capsule 0    Blood pressure 157/108, pulse 97, temperature 100.6 F (38.1 C), temperature  source Oral, resp. rate 18, height _0  (1.702 m), weight 83.9 kg (184 lb 15.5 oz), SpO2 100 %. Physical Exam: General: pleasant, WD/WN african [place] male who is laying in bed in mild distress. HEENT: head is normocephalic, atraumatic.  Sclera are noninjected.  PERRL.  Ears and nose without any masses or lesions.  Mouth is pink and moist Heart: regular, rate, and rhythm.  No obvious murmurs, gallops, or rubs noted.  Palpable pedal pulses bilaterally. Lungs: CTAB, no wheezes, rhonchi, or rales noted.   Respiratory effort nonlabored Abd: soft, NT/ND, +BS, no masses, hernias, or organomegaly. Scar <1 cm near umbilicus from pt-reported I&D. GU: testicles non-tender, mobile BL. No palpable masses. MS: all 4 extremities are symmetrical with no cyanosis, clubbing, or edema. Skin: warm and dry with no rashes. Tender left inguinal adenopathy, enlarged to approximately 4x10 cm with surrounding erythema. Surgical scar on medial aspect of LLE. No calf tenderness. No erythema or swelling of the LE. Psych: A&Ox3 with an appropriate affect. Neuro: extremity CSM intact bilaterally, normal speech  Results for orders placed or performed during the hospital encounter of 05/28/15 (from the past 48 hour(s))  Urinalysis, Routine w reflex microscopic     Status: None   Collection Time: 05/28/15  4:15 PM  Result Value Ref Range   Color, Urine YELLOW YELLOW   APPearance CLEAR CLEAR   Specific Gravity, Urine 1.025 1.005 - 1.030   pH 5.5 5.0 - 8.0   Glucose, UA NEGATIVE NEGATIVE mg/dL   Hgb urine dipstick NEGATIVE NEGATIVE   Bilirubin Urine NEGATIVE NEGATIVE   Ketones, ur NEGATIVE NEGATIVE mg/dL   Protein, ur NEGATIVE NEGATIVE mg/dL   Urobilinogen, UA 0.2 0.0 - 1.0 mg/dL   Nitrite NEGATIVE NEGATIVE   Leukocytes, UA NEGATIVE NEGATIVE    Comment: MICROSCOPIC NOT DONE ON URINES WITH NEGATIVE PROTEIN, BLOOD, LEUKOCYTES, NITRITE, OR GLUCOSE <1000 mg/dL.  CBC with Differential     Status: Abnormal   Collection Time: 05/28/15  4:20 PM  Result Value Ref Range   WBC 13.1 (H) 4.0 - 10.5 K/uL   RBC 4.33 4.22 - 5.81 MIL/uL   Hemoglobin 11.7 (L) 13.0 - 17.0 g/dL   HCT 36.9 (L) 39.0 - 52.0 %   MCV 85.2 78.0 - 100.0 fL   MCH 27.0 26.0 - 34.0 pg   MCHC 31.7 30.0 - 36.0 g/dL   RDW 14.2 11.5 - 15.5 %   Platelets 223 150 - 400 K/uL   Neutrophils Relative % 81 (H) 43 - 77 %   Neutro Abs 10.6 (H) 1.7 - 7.7 K/uL   Lymphocytes Relative 13 12 - 46 %   Lymphs Abs 1.7 0.7 - 4.0 K/uL   Monocytes Relative 4 3 - 12 %    Monocytes Absolute 0.6 0.1 - 1.0 K/uL   Eosinophils Relative 2 0 - 5 %   Eosinophils Absolute 0.2 0.0 - 0.7 K/uL   Basophils Relative 0 0 - 1 %   Basophils Absolute 0.0 0.0 - 0.1 K/uL  Comprehensive metabolic panel     Status: Abnormal   Collection Time: 05/28/15  4:20 PM  Result Value Ref Range   Sodium 141 135 - 145 mmol/L   Potassium 3.7 3.5 - 5.1 mmol/L   Chloride 107 101 - 111 mmol/L   CO2 26 22 - 32 mmol/L   Glucose, Bld 94 65 - 99 mg/dL   BUN 8 6 - 20 mg/dL   Creatinine, Ser 0.98 0.61 - 1.24 mg/dL   Calcium 9.1 8.9 -  10.3 mg/dL   Total Protein 8.3 (H) 6.5 - 8.1 g/dL   Albumin 4.3 3.5 - 5.0 g/dL   AST 26 15 - 41 U/L   ALT 26 17 - 63 U/L   Alkaline Phosphatase 88 38 - 126 U/L   Total Bilirubin 0.4 0.3 - 1.2 mg/dL   GFR calc non Af Amer >60 >60 mL/min   GFR calc Af Amer >60 >60 mL/min    Comment: (NOTE) The eGFR has been calculated using the CKD EPI equation. This calculation has not been validated in all clinical situations. eGFR's persistently <60 mL/min signify possible Chronic Kidney Disease.    Anion gap 8 5 - 15  I-Stat CG4 Lactic Acid, ED     Status: None   Collection Time: 05/28/15 10:08 PM  Result Value Ref Range   Lactic Acid, Venous 1.97 0.5 - 2.0 mmol/L  Magnesium     Status: None   Collection Time: 05/28/15 10:10 PM  Result Value Ref Range   Magnesium 1.9 1.7 - 2.4 mg/dL  Urine rapid drug screen (hosp performed)     Status: Abnormal   Collection Time: 05/28/15 10:55 PM  Result Value Ref Range   Opiates NONE DETECTED NONE DETECTED   Cocaine NONE DETECTED NONE DETECTED   Benzodiazepines NONE DETECTED NONE DETECTED   Amphetamines NONE DETECTED NONE DETECTED   Tetrahydrocannabinol POSITIVE (A) NONE DETECTED   Barbiturates NONE DETECTED NONE DETECTED    Comment:        DRUG SCREEN FOR MEDICAL PURPOSES ONLY.  IF CONFIRMATION IS NEEDED FOR ANY PURPOSE, NOTIFY LAB WITHIN 5 DAYS.        LOWEST DETECTABLE LIMITS FOR URINE DRUG SCREEN Drug Class        Cutoff (ng/mL) Amphetamine      1000 Barbiturate      200 Benzodiazepine   563 Tricyclics       149 Opiates          300 Cocaine          300 THC              50   CBC     Status: Abnormal   Collection Time: 05/29/15  4:43 AM  Result Value Ref Range   WBC 12.7 (H) 4.0 - 10.5 K/uL   RBC 4.34 4.22 - 5.81 MIL/uL   Hemoglobin 11.7 (L) 13.0 - 17.0 g/dL   HCT 37.2 (L) 39.0 - 52.0 %   MCV 85.7 78.0 - 100.0 fL   MCH 27.0 26.0 - 34.0 pg   MCHC 31.5 30.0 - 36.0 g/dL   RDW 14.2 11.5 - 15.5 %   Platelets 223 150 - 400 K/uL  Comprehensive metabolic panel     Status: Abnormal   Collection Time: 05/29/15  4:43 AM  Result Value Ref Range   Sodium 138 135 - 145 mmol/L   Potassium 3.5 3.5 - 5.1 mmol/L   Chloride 103 101 - 111 mmol/L   CO2 27 22 - 32 mmol/L   Glucose, Bld 88 65 - 99 mg/dL   BUN 9 6 - 20 mg/dL   Creatinine, Ser 1.10 0.61 - 1.24 mg/dL   Calcium 8.4 (L) 8.9 - 10.3 mg/dL   Total Protein 7.7 6.5 - 8.1 g/dL   Albumin 3.7 3.5 - 5.0 g/dL   AST 20 15 - 41 U/L   ALT 20 17 - 63 U/L   Alkaline Phosphatase 76 38 - 126 U/L   Total Bilirubin 0.6 0.3 - 1.2  mg/dL   GFR calc non Af Amer >60 >60 mL/min   GFR calc Af Amer >60 >60 mL/min    Comment: (NOTE) The eGFR has been calculated using the CKD EPI equation. This calculation has not been validated in all clinical situations. eGFR's persistently <60 mL/min signify possible Chronic Kidney Disease.    Anion gap 8 5 - 15  Lactic acid, plasma     Status: None   Collection Time: 05/29/15  7:30 AM  Result Value Ref Range   Lactic Acid, Venous 0.9 0.5 - 2.0 mmol/L   Dg Chest 2 View  05/28/2015   CLINICAL DATA:  Sudden onset of fever.  Leukocytosis.  EXAM: CHEST  2 VIEW  COMPARISON:  None.  FINDINGS: The cardiomediastinal contours are normal. The lungs are clear. Pulmonary vasculature is normal. No consolidation, pleural effusion, or pneumothorax. No acute osseous abnormalities are seen.  IMPRESSION: No acute pulmonary process.    Electronically Signed   By: Jeb Levering M.D.   On: 05/28/2015 23:09   Ct Abdomen Pelvis W Contrast  05/28/2015   CLINICAL DATA:  Patient with sharp pain in the left groin region. Feels this is related to a hernia.  EXAM: CT ABDOMEN AND PELVIS WITH CONTRAST  TECHNIQUE: Multidetector CT imaging of the abdomen and pelvis was performed using the standard protocol following bolus administration of intravenous contrast.  CONTRAST:  44mL OMNIPAQUE IOHEXOL 300 MG/ML SOLN, 131mL OMNIPAQUE IOHEXOL 300 MG/ML SOLN  COMPARISON:  None.  FINDINGS: Lower chest: Normal heart size. No consolidative or nodular pulmonary opacities. No pleural effusion.  Hepatobiliary: The liver is normal in size and contour. No focal hepatic lesion is identified. Gallbladder is unremarkable. No intrahepatic or extrahepatic biliary ductal dilatation.  Pancreas: Unremarkable  Spleen: Unremarkable  Adrenals/Urinary Tract: Adrenal glands are normal. Kidneys enhance symmetrically with contrast. No hydronephrosis. The urinary bladder is unremarkable.  Stomach/Bowel: Colon is decompressed. No abnormal bowel wall thickening or evidence for bowel obstruction. The appendix is normal. No free fluid or free intraperitoneal air.  Vascular/Lymphatic: Normal caliber abdominal aorta. Multiple sub cm retroperitoneal lymph nodes are demonstrated. Enlarged left external iliac lymph nodes measuring 2.0 cm (image 70; series 2) and 2.1 cm (image 72; series 2). Prominent left inguinal lymph nodes. Adenopathy causes narrowing of left femoral vein.  Other: Extensive soft tissue stranding within the left inguinal region.  Musculoskeletal: No aggressive or acute appearing osseous lesions.  IMPRESSION: Nonspecific soft tissue stranding within the left inguinal region image may be secondary to an infectious, inflammatory or malignant process. There are enlarged left external iliac lymph nodes measuring up to 2.1 cm which may be secondary to an infectious, inflammatory or  malignant process.   Electronically Signed   By: Lovey Newcomer M.D.   On: 05/28/2015 18:27    Assessment/Plan Left inguinal adenopathy Plan to tx pt conservatively with abx and IVF for suspected infection. Will do regular patient re-checks to assess improvement. If patient does not improve after 48h of abx, will consider further testing or imaging.   1. IVF NS @ 100 mL/hr 2. Antibiotics - Zosyn Day #1, WBC 12.7 today. Receck CBC in AM. 3. Antiemetics - Zofran 4 mg q 6h PRN 4. Pain control - oxyCODONE 5-10 mg q 4h, Robaxin 1,000 mg q 8h  5. Fever - Tylenol 650 mg q 6h PRN  6. SCD's and lovenox for DVT proph 7. Ambulate as tolerable   HIV Hypertension  Mila Palmer, [facility] Surgery 05/29/2015, 11:14 AM Pager: Physician  Environmental education officer, [employer]

## 2015-05-29 NOTE — Progress Notes (Addendum)
[facility] PROGRESS NOTE Assessment/Plan: Inguinal adenopathy /sepsis Surgery was consulted and recommended no surgical debridement, not a strangulated hernia. She has a history of initially she was continued on her antiviral with oral therapy She was started empirically on clindamycin and Zosyn, deescalate to unasyn, and cont clindamycin. She slowly defervesced, and her leukocytosis is improving. GC chlamydia and gonorrhea are pending  Code Status: full Family Communication: bedside Disposition Plan: inpatient for 2-4 days   Consultants:  surgery  Procedures:  Ct abd and pelvis  Antibiotics:  1 dose of Clindamycin and currently on zosyn since 05/29/2015  HPI/Subjective: Continues to have painful left groin tolerating diet.  Objective: Filed Vitals:   05/29/15 0549 05/29/15 0657 05/29/15 0755 05/29/15 1224  BP: 149/108 157/108    Pulse: 97     Temp: 102.8 F (39.3 C) 102.3 F (39.1 C) 100.6 F (38.1 C) 100.5 F (38.1 C)  TempSrc: Oral Oral Oral Oral  Resp: 18     Height:      Weight:      SpO2: 100%       Intake/Output Summary (Last 24 hours) at 05/29/15 1309 Last data filed at 05/29/15 1030  Gross per 24 hour  Intake    600 ml  Output    900 ml  Net   -300 ml   Filed Weights   05/28/15 2216 05/29/15 0000  Weight: 83.9 kg (184 lb 15.5 oz) 83.9 kg (184 lb 15.5 oz)    Exam:  General: Alert, awake, oriented x3, in no acute distress.  HEENT: No bruits, no goiter.  Heart: Regular rate and rhythm. Lungs: Good air movement, clear Abdomen: Soft, nontender, nondistended, positive bowel sounds.  Left inguinal groin erythematous tender to touch and warm lymphadenopathy   Data Reviewed: Basic Metabolic Panel:  Recent Labs Lab 05/28/15 1620 05/28/15 2210 05/29/15 0443  NA 141  --  138  K 3.7  --  3.5  CL 107  --  103  CO2 26  --  27  GLUCOSE 94  --  88  BUN 8  --  9  CREATININE 0.98  --  1.10  CALCIUM 9.1  --  8.4*  MG  --  1.9  --     Liver Function Tests:  Recent Labs Lab 05/28/15 1620 05/29/15 0443  AST 26 20  ALT 26 20  ALKPHOS 88 76  BILITOT 0.4 0.6  PROT 8.3* 7.7  ALBUMIN 4.3 3.7   No results for input(s): LIPASE, AMYLASE in the last 168 hours. No results for input(s): AMMONIA in the last 168 hours. CBC:  Recent Labs Lab 05/28/15 1620 05/29/15 0443  WBC 13.1* 12.7*  NEUTROABS 10.6*  --   HGB 11.7* 11.7*  HCT 36.9* 37.2*  MCV 85.2 85.7  PLT 223 223   Cardiac Enzymes: No results for input(s): CKTOTAL, CKMB, CKMBINDEX, TROPONINI in the last 168 hours. BNP (last 3 results) No results for input(s): BNP in the last 8760 hours.  ProBNP (last 3 results) No results for input(s): PROBNP in the last 8760 hours.  CBG: No results for input(s): GLUCAP in the last 168 hours.  No results found for this or any previous visit (from the past 240 hour(s)).   Studies: Dg Chest 2 View  05/28/2015   CLINICAL DATA:  Sudden onset of fever.  Leukocytosis.  EXAM: CHEST  2 VIEW  COMPARISON:  None.  FINDINGS: The cardiomediastinal contours are normal. The lungs are clear. Pulmonary vasculature is normal. No consolidation, pleural  effusion, or pneumothorax. No acute osseous abnormalities are seen.  IMPRESSION: No acute pulmonary process.   Electronically Signed   By: Rubye Oaks M.D.   On: 05/28/2015 23:09   Ct Abdomen Pelvis W Contrast  05/28/2015   CLINICAL DATA:  Patient with sharp pain in the left groin region. Feels this is related to a hernia.  EXAM: CT ABDOMEN AND PELVIS WITH CONTRAST  TECHNIQUE: Multidetector CT imaging of the abdomen and pelvis was performed using the standard protocol following bolus administration of intravenous contrast.  CONTRAST:  25mL OMNIPAQUE IOHEXOL 300 MG/ML SOLN, OMNIPAQUE IOHEXOL 300 MG/ML SOLN  COMPARISON:  None.  FINDINGS: Lower chest: Normal heart size. No consolidative or nodular pulmonary opacities. No pleural effusion.  Hepatobiliary: The liver is normal in size and  contour. No focal hepatic lesion is identified. Gallbladder is unremarkable. No intrahepatic or extrahepatic biliary ductal dilatation.  Pancreas: Unremarkable  Spleen: Unremarkable  Adrenals/Urinary Tract: Adrenal glands are normal. Kidneys enhance symmetrically with contrast. No hydronephrosis. The urinary bladder is unremarkable.  Stomach/Bowel: Colon is decompressed. No abnormal bowel wall thickening or evidence for bowel obstruction. The appendix is normal. No free fluid or free intraperitoneal air.  Vascular/Lymphatic: Normal caliber abdominal aorta. Multiple sub cm retroperitoneal lymph nodes are demonstrated. Enlarged left external iliac lymph nodes measuring 2.0 cm (image 70; series 2) and 2.1 cm (image 72; series 2). Prominent left inguinal lymph nodes. Adenopathy causes narrowing of left femoral vein.  Other: Extensive soft tissue stranding within the left inguinal region.  Musculoskeletal: No aggressive or acute appearing osseous lesions.  IMPRESSION: Nonspecific soft tissue stranding within the left inguinal region image may be secondary to an infectious, inflammatory or malignant process. There are enlarged left external iliac lymph nodes measuring up to 2.1 cm which may be secondary to an infectious, inflammatory or malignant process.   Electronically Signed   By: Annia Belt M.D.   On: 05/28/2015 18:27    Scheduled Meds: . amLODipine  5 mg Oral Daily  . aspirin EC  325 mg Oral Q12H  . emtricitabine-tenofovir  1 tablet Oral Daily  . enoxaparin (LOVENOX) injection  40 mg Subcutaneous QHS  . piperacillin-tazobactam (ZOSYN)  IV  3.375 g Intravenous Q8H  . rilpivirine  25 mg Oral Q breakfast   Continuous Infusions: . sodium chloride 100 mL/hr at 05/29/15 0000    Time Spent: 25 min   Marinda Elk  [facility] Pager [phone]. If 7PM-7AM, please contact night-coverage at [email or web address], password [facility] 05/29/2015, 1:09 PM  LOS: 1 day

## 2015-05-30 DIAGNOSIS — R509 Fever, unspecified: Secondary | ICD-10-CM

## 2015-05-30 LAB — BASIC METABOLIC PANEL WITH GFR
Anion gap: 6 (ref 5–15)
BUN: 7 mg/dL (ref 6–20)
CO2: 27 mmol/L (ref 22–32)
Calcium: 8.6 mg/dL — ABNORMAL LOW (ref 8.9–10.3)
Chloride: 105 mmol/L (ref 101–111)
Creatinine, Ser: 1.1 mg/dL (ref 0.61–1.24)
GFR calc Af Amer: >60 mL/min
GFR calc non Af Amer: >60 mL/min
Glucose, Bld: 122 mg/dL — ABNORMAL HIGH (ref 65–99)
Potassium: 3.4 mmol/L — ABNORMAL LOW (ref 3.5–5.1)
Sodium: 138 mmol/L (ref 135–145)

## 2015-05-30 LAB — CBC
HCT: 33.4 % — ABNORMAL LOW (ref 39.0–52.0)
Hemoglobin: 10.7 g/dL — ABNORMAL LOW (ref 13.0–17.0)
MCH: 27.2 pg (ref 26.0–34.0)
MCHC: 32 g/dL (ref 30.0–36.0)
MCV: 84.8 fL (ref 78.0–100.0)
Platelets: 209 K/uL (ref 150–400)
RBC: 3.94 MIL/uL — ABNORMAL LOW (ref 4.22–5.81)
RDW: 13.8 % (ref 11.5–15.5)
WBC: 10 K/uL (ref 4.0–10.5)

## 2015-05-30 LAB — GC/CHLAMYDIA PROBE AMP (~~LOC~~) NOT AT ARMC
Chlamydia: NEGATIVE
Neisseria Gonorrhea: NEGATIVE

## 2015-05-30 MED ORDER — POTASSIUM CHLORIDE CRYS ER 20 MEQ PO TBCR
40.0000 meq | EXTENDED_RELEASE_TABLET | Freq: Two times a day (BID) | ORAL | Status: AC
  Administered 2015-05-30 (×2): 40 meq via ORAL
  Filled 2015-05-30 (×2): qty 2

## 2015-05-30 NOTE — Progress Notes (Signed)
[facility] Progress Note     Subjective: Clifford Shields is a 29 y/o AA male with a PMH of HIV, HTN, and left tib/fib repair in July who presented to the ED 9/13 with left groin pain. He was admitted and started on Zosyn yesterday for suspected left inguinal lymph infection. He is well appearing today and more pleasant than yesterday. He reports pressure and mild pain in his left groin, improved from yesterday, that is non-radiating and exacerbated with movement. He denies fever, chills, N/V/D, and leg pain. He states that he has been keeping a heat pack over the area and it provides relief. He also reports taking a hot shower yesterday and going for a walk outside to get fresh air. We discussed the importance of ambulation, as well as staying on the 5th floor of the hospital. His partner Jill Alexanders entered the room towards the end of the H&P and we discussed the pts daignosis and care plan with him. All pt questions and concerns were addressed.  The pt also reports a small, infected hair follicle of the right upper leg. He was advised to apply heat to the area and to ask the nurse for a band-aid if it drained purulent fluid.   Objective: Vital signs in last 24 hours: Temp:  [98.9 F (37.2 C)-102 F (38.9 C)] 99.3 F (37.4 C) (09/15 0857) Pulse Rate:  [85-108] 85 (09/15 0857) Resp:  [18-20] 18 (09/15 0857) BP: (128-153)/(72-90) 136/77 mmHg (09/15 0857) SpO2:  [100 %] 100 % (09/15 0857) Last BM Date: 05/28/15  Intake/Output from previous day: 09/14 0701 - 09/15 0700 In: 2640 [P.O.:240; I.V.:2400] Out: 3575 [Urine:3575] Intake/Output this shift: Total I/O In: -  Out: 350 [Urine:350]  PE: Gen:  Alert, NAD, pleasant Card:  RRR, no M/G/R heard, radial pulse 2+ BL Pulm:  CTA, no W/R/R Abd: Soft, NT/ND, +BS, no HSM Skin: left inguinal adenopathy with mild tenderness and surrounding erythema. Surgical scar on the medial aspect of left lower leg. Small, <1cm pustular lesion of the  right upper thigh, with no purulent drainage, induration, or erythema. Ext:  No erythema, edema, or tenderness   Lab Results:   Recent Labs  05/29/15 0443 05/30/15 0805  WBC 12.7* 10.0  HGB 11.7* 10.7*  HCT 37.2* 33.4*  PLT 223 209   BMET  Recent Labs  05/29/15 0443 05/30/15 0805  NA 138 138  K 3.5 3.4*  CL 103 105  CO2 27 27  GLUCOSE 88 122*  BUN 9 7  CREATININE 1.10 1.10  CALCIUM 8.4* 8.6*   PT/INR No results for input(s): LABPROT, INR in the last 72 hours. CMP     Component Value Date/Time   NA 138 05/30/2015 0805   K 3.4* 05/30/2015 0805   CL 105 05/30/2015 0805   CO2 27 05/30/2015 0805   GLUCOSE 122* 05/30/2015 0805   BUN 7 05/30/2015 0805   CREATININE 1.10 05/30/2015 0805   CREATININE 1.13 10/04/2014 1514   CALCIUM 8.6* 05/30/2015 0805   CALCIUM 8.6* 03/28/2015 1250   PROT 7.7 05/29/2015 0443   ALBUMIN 3.7 05/29/2015 0443   AST 20 05/29/2015 0443   ALT 20 05/29/2015 0443   ALKPHOS 76 05/29/2015 0443   BILITOT 0.6 05/29/2015 0443   GFRNONAA >60 05/30/2015 0805   GFRNONAA 89 10/04/2014 1514   GFRAA >60 05/30/2015 0805   GFRAA >89 10/04/2014 1514   Lipase  No results found for: LIPASE   Studies/Results: Dg Chest 2 View  05/28/2015  CLINICAL DATA:  Sudden onset of fever.  Leukocytosis.  EXAM: CHEST  2 VIEW  COMPARISON:  None.  FINDINGS: The cardiomediastinal contours are normal. The lungs are clear. Pulmonary vasculature is normal. No consolidation, pleural effusion, or pneumothorax. No acute osseous abnormalities are seen.  IMPRESSION: No acute pulmonary process.   Electronically Signed   By: Rubye Oaks M.D.   On: 05/28/2015 23:09   Ct Abdomen Pelvis W Contrast  05/28/2015   CLINICAL DATA:  Patient with sharp pain in the left groin region. Feels this is related to a hernia.  EXAM: CT ABDOMEN AND PELVIS WITH CONTRAST  TECHNIQUE: Multidetector CT imaging of the abdomen and pelvis was performed using the standard protocol following bolus  administration of intravenous contrast.  CONTRAST:  25mL OMNIPAQUE IOHEXOL 300 MG/ML SOLN, OMNIPAQUE IOHEXOL 300 MG/ML SOLN  COMPARISON:  None.  FINDINGS: Lower chest: Normal heart size. No consolidative or nodular pulmonary opacities. No pleural effusion.  Hepatobiliary: The liver is normal in size and contour. No focal hepatic lesion is identified. Gallbladder is unremarkable. No intrahepatic or extrahepatic biliary ductal dilatation.  Pancreas: Unremarkable  Spleen: Unremarkable  Adrenals/Urinary Tract: Adrenal glands are normal. Kidneys enhance symmetrically with contrast. No hydronephrosis. The urinary bladder is unremarkable.  Stomach/Bowel: Colon is decompressed. No abnormal bowel wall thickening or evidence for bowel obstruction. The appendix is normal. No free fluid or free intraperitoneal air.  Vascular/Lymphatic: Normal caliber abdominal aorta. Multiple sub cm retroperitoneal lymph nodes are demonstrated. Enlarged left external iliac lymph nodes measuring 2.0 cm (image 70; series 2) and 2.1 cm (image 72; series 2). Prominent left inguinal lymph nodes. Adenopathy causes narrowing of left femoral vein.  Other: Extensive soft tissue stranding within the left inguinal region.  Musculoskeletal: No aggressive or acute appearing osseous lesions.  IMPRESSION: Nonspecific soft tissue stranding within the left inguinal region image may be secondary to an infectious, inflammatory or malignant process. There are enlarged left external iliac lymph nodes measuring up to 2.1 cm which may be secondary to an infectious, inflammatory or malignant process.   Electronically Signed   By: Annia Belt M.D.   On: 05/28/2015 18:27    Anti-infectives: Anti-infectives    Start     Dose/Rate Route Frequency Ordered Stop   05/29/15 1500  ampicillin-sulbactam (UNASYN) 1.5 g in sodium chloride 0.9 % 50 mL IVPB     1.5 g 100 mL/hr over 30 Minutes Intravenous Every 6 hours 05/29/15 1324     05/29/15 1400  clindamycin  (CLEOCIN) IVPB 900 mg     900 mg 100 mL/hr over 30 Minutes Intravenous 3 times per day 05/29/15 1325 05/30/15 0556   05/29/15 1000  Emtricitab-Rilpivir-Tenofov DF 200-25-300 MG TABS 1 tablet  Status:  Discontinued     1 tablet Oral Daily 05/28/15 2328 05/29/15 0154   05/29/15 1000  emtricitabine-tenofovir (TRUVADA) 200-300 MG per tablet 1 tablet     1 tablet Oral Daily 05/29/15 0154     05/29/15 0800  rilpivirine (EDURANT) tablet 25 mg     25 mg Oral Daily with breakfast 05/29/15 0154     05/29/15 0600  piperacillin-tazobactam (ZOSYN) IVPB 3.375 g  Status:  Discontinued     3.375 g 12.5 mL/hr over 240 Minutes Intravenous Every 8 hours 05/28/15 2217 05/29/15 1324   05/28/15 2230  piperacillin-tazobactam (ZOSYN) IVPB 3.375 g     3.375 g 100 mL/hr over 30 Minutes Intravenous STAT 05/28/15 2216 05/28/15 2307   05/28/15 1930  vancomycin (VANCOCIN) IVPB 1000  mg/200 mL premix  Status:  Discontinued     1,000 mg 200 mL/hr over 60 Minutes Intravenous  Once 05/28/15 1924 05/28/15 1924   05/28/15 1930  clindamycin (CLEOCIN) IVPB 900 mg     900 mg 100 mL/hr over 30 Minutes Intravenous  Once 05/28/15 1924 05/28/15 2025       Assessment/Plan Left inguinal adenopathy Continue to tx pt conservatively with abx and IVF for suspected infection. Will do regular patient re-checks to assess improvement.  1. IVF NS @ 100 mL/hr 2. Antibiotics - Zosyn Day #2, WBC down to 10.0 today, from 12.7 today. Receck CBC in AM. 3. Antiemetics - Zofran 4 mg q 6h PRN 4. Pain control - oxyCODONE 5-10 mg q 4h, Robaxin 1,000 mg q 8h, heat/hot showers  5. Fever - Tylenol 650 mg q 6h PRN  6. SCD's and lovenox for DVT proph 7. Ambulate as tolerable   HIV Hypertension   LOS: 2 days    Bobbye Riggs 05/30/2015, 9:45 AM Pager: [phone]

## 2015-05-30 NOTE — Progress Notes (Signed)
[facility] PROGRESS NOTE Assessment/Plan: Inguinal adenopathy /sepsis Appreciate surgery Surgery assistance. She has a history of HIV. She was started empirically on clindamycin and Zosyn, deescalate to augmentin No fever, leukocytosis resolved. GC chlamydia and gonorrhea are pending, BC negative till date.  Code Status: full Family Communication: bedside Disposition Plan: inpatient for 2-4 days   Consultants:  surgery  Procedures:  Ct abd and pelvis  Antibiotics:  Augmentin  HPI/Subjective: Pain improved. No fever tolerating diet  Objective: Filed Vitals:   05/29/15 2200 05/30/15 0600 05/30/15 0857 05/30/15 1000  BP: 153/90 128/72 136/77 159/96  Pulse: 108 87 85 93  Temp: 102 F (38.9 C) 98.9 F (37.2 C) 99.3 F (37.4 C) 99.3 F (37.4 C)  TempSrc: Oral  Oral Oral  Resp: Height:      Weight:      SpO2: 100% 100% 100%     Intake/Output Summary (Last 24 hours) at 05/30/15 1255 Last data filed at 05/30/15 0939  Gross per 24 hour  Intake   2240 ml  Output   3025 ml  Net   -785 ml   Filed Weights   05/28/15 2216 05/29/15 0000  Weight: 83.9 kg (184 lb 15.5 oz) 83.9 kg (184 lb 15.5 oz)    Exam:  General: Alert, awake, oriented x3, in no acute distress.  HEENT: No bruits, no goiter.  Heart: Regular rate and rhythm. Lungs: Good air movement, clear Abdomen: Soft, nontender, nondistended, positive bowel sounds.  Left inguinal groin erythematous tender to touch and warm lymphadenopathy   Data Reviewed: Basic Metabolic Panel:  Recent Labs Lab 05/28/15 1620 05/28/15 2210 05/29/15 0443 05/30/15 0805  NA 141  --  138 138  K 3.7  --  3.5 3.4*  CL 107  --  103 105  CO2 26  --  27 27  GLUCOSE 94  --  88 122*  BUN 8  --  9 7  CREATININE 0.98  --  1.10 1.10  CALCIUM 9.1  --  8.4* 8.6*  MG  --  1.9  --   --    Liver Function Tests:  Recent Labs Lab 05/28/15 1620 05/29/15 0443  AST 26 20  ALT 26 20  ALKPHOS 88 76    BILITOT 0.4 0.6  PROT 8.3* 7.7  ALBUMIN 4.3 3.7   No results for input(s): LIPASE, AMYLASE in the last 168 hours. No results for input(s): AMMONIA in the last 168 hours. CBC:  Recent Labs Lab 05/28/15 1620 05/29/15 0443 05/30/15 0805  WBC 13.1* 12.7* 10.0  NEUTROABS 10.6*  --   --   HGB 11.7* 11.7* 10.7*  HCT 36.9* 37.2* 33.4*  MCV 85.2 85.7 84.8  PLT 223 223 209   Cardiac Enzymes: No results for input(s): CKTOTAL, CKMB, CKMBINDEX, TROPONINI in the last 168 hours. BNP (last 3 results) No results for input(s): BNP in the last 8760 hours.  ProBNP (last 3 results) No results for input(s): PROBNP in the last 8760 hours.  CBG: No results for input(s): GLUCAP in the last 168 hours.  No results found for this or any previous visit (from the past 240 hour(s)).   Studies: Dg Chest 2 View  05/28/2015   CLINICAL DATA:  Sudden onset of fever.  Leukocytosis.  EXAM: CHEST  2 VIEW  COMPARISON:  None.  FINDINGS: The cardiomediastinal contours are normal. The lungs are clear. Pulmonary vasculature is normal. No consolidation, pleural effusion, or pneumothorax. No acute osseous abnormalities are seen.  IMPRESSION: No acute pulmonary process.   Electronically Signed   By: Rubye Oaks M.D.   On: 05/28/2015 23:09   Ct Abdomen Pelvis W Contrast  05/28/2015   CLINICAL DATA:  Patient with sharp pain in the left groin region. Feels this is related to a hernia.  EXAM: CT ABDOMEN AND PELVIS WITH CONTRAST  TECHNIQUE: Multidetector CT imaging of the abdomen and pelvis was performed using the standard protocol following bolus administration of intravenous contrast.  CONTRAST:  25mL OMNIPAQUE IOHEXOL 300 MG/ML SOLN, OMNIPAQUE IOHEXOL 300 MG/ML SOLN  COMPARISON:  None.  FINDINGS: Lower chest: Normal heart size. No consolidative or nodular pulmonary opacities. No pleural effusion.  Hepatobiliary: The liver is normal in size and contour. No focal hepatic lesion is identified. Gallbladder is  unremarkable. No intrahepatic or extrahepatic biliary ductal dilatation.  Pancreas: Unremarkable  Spleen: Unremarkable  Adrenals/Urinary Tract: Adrenal glands are normal. Kidneys enhance symmetrically with contrast. No hydronephrosis. The urinary bladder is unremarkable.  Stomach/Bowel: Colon is decompressed. No abnormal bowel wall thickening or evidence for bowel obstruction. The appendix is normal. No free fluid or free intraperitoneal air.  Vascular/Lymphatic: Normal caliber abdominal aorta. Multiple sub cm retroperitoneal lymph nodes are demonstrated. Enlarged left external iliac lymph nodes measuring 2.0 cm (image 70; series 2) and 2.1 cm (image 72; series 2). Prominent left inguinal lymph nodes. Adenopathy causes narrowing of left femoral vein.  Other: Extensive soft tissue stranding within the left inguinal region.  Musculoskeletal: No aggressive or acute appearing osseous lesions.  IMPRESSION: Nonspecific soft tissue stranding within the left inguinal region image may be secondary to an infectious, inflammatory or malignant process. There are enlarged left external iliac lymph nodes measuring up to 2.1 cm which may be secondary to an infectious, inflammatory or malignant process.   Electronically Signed   By: Annia Belt M.D.   On: 05/28/2015 18:27    Scheduled Meds: . amLODipine  5 mg Oral Daily  . ampicillin-sulbactam (UNASYN) IV  1.5 g Intravenous Q6H  . aspirin EC  325 mg Oral Q12H  . emtricitabine-tenofovir  1 tablet Oral Daily  . enoxaparin (LOVENOX) injection  40 mg Subcutaneous QHS  . rilpivirine  25 mg Oral Q breakfast   Continuous Infusions: . sodium chloride 100 mL/hr at 05/29/15 2110    Time Spent: 25 min   Marinda Elk  [facility] Pager [phone]. If 7PM-7AM, please contact night-coverage at [email or web address], password [facility] 05/30/2015, 12:55 PM  LOS: 2 days

## 2015-05-31 MED ORDER — AMOXICILLIN-POT CLAVULANATE 200-28.5 MG PO CHEW: 1.0000 | CHEWABLE_TABLET | Freq: Two times a day (BID) | ORAL | Status: DC

## 2015-05-31 MED ORDER — PIPERACILLIN-TAZOBACTAM 3.375 G IVPB 30 MIN
3.3750 g | Freq: Four times a day (QID) | INTRAVENOUS | Status: DC
  Administered 2015-05-31: 3.375 g via INTRAVENOUS
  Filled 2015-05-31 (×2): qty 50

## 2015-05-31 NOTE — Progress Notes (Signed)
[facility] PROGRESS NOTE Assessment/Plan: Inguinal adenopathy /sepsis He was started empirically de-escalated to Unasyn and develop fevers overnight. change back to zosyn, patient wants to go home. GC chlamydia and gonorrhea are pending, BC negative till date. I have explained to him the risk and benefits of his condition and leaving with having this infection under control will only get worst. He related that is wanting to leave.  Code Status: full Family Communication: bedside Disposition Plan: inpatient for 2-4 days   Consultants:  surgery  Procedures:  Ct abd and pelvis  Antibiotics:  Augmentin  HPI/Subjective: Pain improved. Develop fevers overnights.he wants to elave against medica advise.  Objective: Filed Vitals:   05/30/15 1400 05/30/15 2105 05/31/15 0150 05/31/15 0608  BP: 143/80 123/68  130/80  Pulse: 95 72  78  Temp: 98.3 F (36.8 C) 101.1 F (38.4 C) 97.6 F (36.4 C) 98.4 F (36.9 C)  TempSrc: Oral Oral Oral Oral  Resp: 20 18  18   Height:      Weight:      SpO2:  100%  99%    Intake/Output Summary (Last 24 hours) at 05/31/15 0948 Last data filed at 05/31/15 0600  Gross per 24 hour  Intake 2838.36 ml  Output   2975 ml  Net -136.64 ml   Filed Weights   05/28/15 2216 05/29/15 0000  Weight: 83.9 kg (184 lb 15.5 oz) 83.9 kg (184 lb 15.5 oz)    Exam:  General: Alert, awake, oriented x3, in no acute distress.  HEENT: No bruits, no goiter.  Heart: Regular rate and rhythm. Lungs: Good air movement, clear Abdomen: Soft, nontender, nondistended, positive bowel sounds.  Left inguinal groin erythematous tender to touch and warm lymphadenopathy   Data Reviewed: Basic Metabolic Panel:  Recent Labs Lab 05/28/15 1620 05/28/15 2210 05/29/15 0443 05/30/15 0805  NA 141  --  138 138  K 3.7  --  3.5 3.4*  CL 107  --  103 105  CO2 26  --  27 27  GLUCOSE 94  --  88 122*  BUN 8  --  9 7  CREATININE 0.98  --  1.10 1.10  CALCIUM 9.1   --  8.4* 8.6*  MG  --  1.9  --   --    Liver Function Tests:  Recent Labs Lab 05/28/15 1620 05/29/15 0443  AST 26 20  ALT 26 20  ALKPHOS 88 76  BILITOT 0.4 0.6  PROT 8.3* 7.7  ALBUMIN 4.3 3.7   No results for input(s): LIPASE, AMYLASE in the last 168 hours. No results for input(s): AMMONIA in the last 168 hours. CBC:  Recent Labs Lab 05/28/15 1620 05/29/15 0443 05/30/15 0805  WBC 13.1* 12.7* 10.0  NEUTROABS 10.6*  --   --   HGB 11.7* 11.7* 10.7*  HCT 36.9* 37.2* 33.4*  MCV 85.2 85.7 84.8  PLT 223 223 209   Cardiac Enzymes: No results for input(s): CKTOTAL, CKMB, CKMBINDEX, TROPONINI in the last 168 hours. BNP (last 3 results) No results for input(s): BNP in the last 8760 hours.  ProBNP (last 3 results) No results for input(s): PROBNP in the last 8760 hours.  CBG: No results for input(s): GLUCAP in the last 168 hours.  Recent Results (from the past 240 hour(s))  Blood culture (routine x 2)     Status: None (Preliminary result)   Collection Time: 05/28/15  9:59 PM  Result Value Ref Range Status   Specimen Description BLOOD LEFT AC  Final  Special Requests BOTTLES DRAWN AEROBIC AND ANAEROBIC 10CC  Final   Culture   Final    NO GROWTH 1 DAY Performed at [facility]    Report Status PENDING  Incomplete  Blood culture (routine x 2)     Status: None (Preliminary result)   Collection Time: 05/28/15 10:00 PM  Result Value Ref Range Status   Specimen Description BLOOD RIGHT FOREARM  Final   Special Requests BOTTLES DRAWN AEROBIC AND ANAEROBIC 5CC  Final   Culture   Final    NO GROWTH 1 DAY Performed at [facility]    Report Status PENDING  Incomplete     Studies: No results found.  Scheduled Meds: . amLODipine  5 mg Oral Daily  . ampicillin-sulbactam (UNASYN) IV  1.5 g Intravenous Q6H  . aspirin EC  325 mg Oral Q12H  . emtricitabine-tenofovir  1 tablet Oral Daily  . enoxaparin (LOVENOX) injection  40 mg Subcutaneous QHS  .  piperacillin-tazobactam  3.375 g Intravenous 4 times per day  . rilpivirine  25 mg Oral Q breakfast   Continuous Infusions: . sodium chloride 100 mL/hr at 05/30/15 2157    Time Spent: 25 min   Marinda Elk  [facility] Pager [phone]. If 7PM-7AM, please contact night-coverage at [email or web address], password [facility] 05/31/2015, 9:48 AM  LOS: 3 days

## 2015-05-31 NOTE — Progress Notes (Signed)
General Surgery Note  LOS: 3 days  POD -     Assessment/Plan: 1. Left groin infection - 4 cm mass/lymphadenopathy  Zosyn - 9/15 >>>  Temp last evening to 101.1.  The patient thinks that the area is less painful.  His WBC returned to normal yesterday.  No plans for surgery.   2.  HIV 3.  HTN 4.  Left tib/fib repair in 03/2015. 5.  DVT prophylaxis - Lovenox 6.  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  Wants to go home.  Partner, Jill Alexanders, in room with patient. Objective:   Filed Vitals:   05/31/15 0608  BP: 130/80  Pulse: 78  Temp: 98.4 F (36.9 C)  Resp: 18     Intake/Output from previous day:  09/15 0701 - 09/16 0700 In: 2838.4 [P.O.:960; I.V.:1878.4] Out: 3325 [Urine:3325]  Intake/Output this shift:      Physical Exam:   General: AA M who is alert and oriented.    HEENT: Normal. Pupils equal.   Abdomen: Soft.  4 cm firm left groin mass.  No fluctulence.   Lab Results:    Recent Labs  05/29/15 0443 05/30/15 0805  WBC 12.7* 10.0  HGB 11.7* 10.7*  HCT 37.2* 33.4*  PLT 223 209    BMET   Recent Labs  05/29/15 0443 05/30/15 0805  NA 138 138  K 3.5 3.4*  CL 103 105  CO2 27 27  GLUCOSE 88 122*  BUN 9 7  CREATININE 1.10 1.10  CALCIUM 8.4* 8.6*    PT/INR  No results for input(s): LABPROT, INR in the last 72 hours.  ABG  No results for input(s): PHART, HCO3 in the last 72 hours.  Invalid input(s): PCO2, PO2   Studies/Results:  No results found.   Anti-infectives:   Anti-infectives    Start     Dose/Rate Route Frequency Ordered Stop   05/31/15 1200  piperacillin-tazobactam (ZOSYN) IVPB 3.375 g     3.375 g 100 mL/hr over 30 Minutes Intravenous 4 times per day 05/31/15 0948     05/31/15 0000  amoxicillin-clavulanate (AUGMENTIN) 200-28.5 MG per chewable tablet     1 tablet Oral 2 times daily 05/31/15 1000     05/29/15 1500  ampicillin-sulbactam (UNASYN) 1.5 g in sodium chloride 0.9 % 50 mL  IVPB  Status:  Discontinued     1.5 g 100 mL/hr over 30 Minutes Intravenous Every 6 hours 05/29/15 1324 05/31/15 0949   05/29/15 1400  clindamycin (CLEOCIN) IVPB 900 mg     900 mg 100 mL/hr over 30 Minutes Intravenous 3 times per day 05/29/15 1325 05/30/15 0556   05/29/15 1000  Emtricitab-Rilpivir-Tenofov DF 200-25-300 MG TABS 1 tablet  Status:  Discontinued     1 tablet Oral Daily 05/28/15 2328 05/29/15 0154   05/29/15 1000  emtricitabine-tenofovir (TRUVADA) 200-300 MG per tablet 1 tablet     1 tablet Oral Daily 05/29/15 0154     05/29/15 0800  rilpivirine (EDURANT) tablet 25 mg     25 mg Oral Daily with breakfast 05/29/15 0154     05/29/15 0600  piperacillin-tazobactam (ZOSYN) IVPB 3.375 g  Status:  Discontinued     3.375 g 12.5 mL/hr over 240 Minutes Intravenous Every 8 hours 05/28/15 2217 05/29/15 1324   05/28/15 2230  piperacillin-tazobactam (ZOSYN) IVPB 3.375 g     3.375 g 100 mL/hr over 30 Minutes Intravenous STAT 05/28/15 2216 05/28/15 2307   05/28/15  1930  vancomycin (VANCOCIN) IVPB 1000 mg/200 mL premix  Status:  Discontinued     1,000 mg 200 mL/hr over 60 Minutes Intravenous  Once 05/28/15 1924 05/28/15 1924   05/28/15 1930  clindamycin (CLEOCIN) IVPB 900 mg     900 mg 100 mL/hr over 30 Minutes Intravenous  Once 05/28/15 1924 05/28/15 2025      Clifford Kin, MD, FACS Pager: [phone] [facility] Office: [phone] 05/31/2015

## 2015-05-31 NOTE — Progress Notes (Signed)
Patient requested to leave facility AMA. Patient was notified of risk of leaving without the continuation of proper treatment and given opportunity to continue course of treatment. Patient declined continuation of care. Waiver was signed and patient was released.

## 2015-06-03 ENCOUNTER — Telehealth: Payer: Self-pay

## 2015-06-03 LAB — CULTURE, BLOOD (ROUTINE X 2)
Culture: NO GROWTH
Culture: NO GROWTH

## 2015-06-03 NOTE — Telephone Encounter (Signed)
Message left on machine to follow up with [facility].   Laurell Josephs, RN

## 2015-06-03 NOTE — Telephone Encounter (Signed)
Patient is calling to request antibiotics. He was admitted last week for 5 days and left AMA because "he has to do what he had to do'". After reviewinf chart I realized he had surgica procedure and was being followed by [facility].

## 2015-06-14 NOTE — Discharge Summary (Signed)
Physician Discharge Summary  Clifford Shields ZOX:[id number] DOB: 1986/02/12 DOA: 05/28/2015       LEFT AMA PCP: No PCP Per Patient  Admit date: 05/28/2015 Discharge date: 06/14/2015  Time spent: 15 minutes  Recommendations for Outpatient Follow-up:   Discharge Diagnoses:  Active Problems:   Inguinal adenopathy   Discharge Condition: stable  Diet recommendation: regular  Filed Weights   05/28/15 2216 05/29/15 0000  Weight: 83.9 kg (184 lb 15.5 oz) 83.9 kg (184 lb 15.5 oz)    History of present illness:  29-year-old with past medical history of essential hypertension and HIV that comes into the ED for left groin pain.  Hospital Course:  Inguinal adenopathy with sepsis: Started empirically on IV Vanco and Zosyn, CT scan of the abdomen and pelvis was done with results as below and was D escalated to Unasyn once he defervesced. GC and Chlamydia were ordered blood cultures remain negative till date. After defervesced in he became febrile so he was changed back to IV Zosyn. When this was explained to the patient he related that he needed to leave the hospital. Risk and benefits were explained about leaving he was given a prescription of Augmentin and he left AGAINST MEDICAL ADVICE.  Procedures:  CT scan of the abdomen and pelvis  Consultations:  None  Discharge Exam: Filed Vitals:   05/31/15 1243  BP:   Pulse:   Temp: 97.9 F (36.6 C)  Resp:     General: Awake alert and oriented 3 Cardiovascular: Regular rate and rhythm Respiratory: Good air movement clear to auscultation  Discharge Instructions    Discharge Medication List as of 05/31/2015  4:30 PM    START taking these medications   Details  amoxicillin-clavulanate (AUGMENTIN) 200-28.5 MG per chewable tablet Chew 1 tablet by mouth 2 (two) times daily., Starting 05/31/2015, Until Discontinued, Print      CONTINUE these medications which have NOT CHANGED   Details  amLODipine (NORVASC) 5 MG tablet TAKE 1  TABLET BY MOUTH DAILY, Normal    cholecalciferol 1000 UNITS tablet Take 1 tablet (1,000 Units total) by mouth every 12 (twelve) hours., Starting 03/29/2015, Until Discontinued, Print    !! COMPLERA 200-25-300 MG TABS TAKE 1 TABLET BY MOUTH DAILY, Normal    diphenhydrAMINE (BENADRYL) 12.5 MG/5ML liquid Take 25 mg by mouth 4 (four) times daily as needed., Until Discontinued, Historical Med    aspirin EC 325 MG tablet Take 1 tablet (325 mg total) by mouth every 12 (twelve) hours., Starting 03/29/2015, Until Discontinued, OTC    !! Emtricitab-Rilpivir-Tenofovir 200-25-300 MG TABS Take 1 tablet by mouth daily., Starting 04/30/2014, Until Discontinued, Print    vitamin C (VITAMIN C) 1000 MG tablet Take 1 tablet (1,000 mg total) by mouth daily., Starting 03/29/2015, Until Discontinued, Print    Vitamin D, Ergocalciferol, (DRISDOL) 50000 UNITS CAPS capsule Take 1 capsule (50,000 Units total) by mouth every 7 (seven) days., Starting 03/29/2015, Until Discontinued, Print     !! - Potential duplicate medications found. Please discuss with Soni Kegel.     No Known Allergies    The results of significant diagnostics from this hospitalization (including imaging, microbiology, ancillary and laboratory) are listed below for reference.    Significant Diagnostic Studies: Dg Chest 2 View  05/28/2015   CLINICAL DATA:  Sudden onset of fever.  Leukocytosis.  EXAM: CHEST  2 VIEW  COMPARISON:  None.  FINDINGS: The cardiomediastinal contours are normal. The lungs are clear. Pulmonary vasculature is normal. No consolidation, pleural effusion, or pneumothorax. No  acute osseous abnormalities are seen.  IMPRESSION: No acute pulmonary process.   Electronically Signed   By: Rubye Oaks M.D.   On: 05/28/2015 23:09   Ct Abdomen Pelvis W Contrast  05/28/2015   CLINICAL DATA:  Patient with sharp pain in the left groin region. Feels this is related to a hernia.  EXAM: CT ABDOMEN AND PELVIS WITH CONTRAST  TECHNIQUE:  Multidetector CT imaging of the abdomen and pelvis was performed using the standard protocol following bolus administration of intravenous contrast.  CONTRAST:  25mL OMNIPAQUE IOHEXOL 300 MG/ML SOLN, OMNIPAQUE IOHEXOL 300 MG/ML SOLN  COMPARISON:  None.  FINDINGS: Lower chest: Normal heart size. No consolidative or nodular pulmonary opacities. No pleural effusion.  Hepatobiliary: The liver is normal in size and contour. No focal hepatic lesion is identified. Gallbladder is unremarkable. No intrahepatic or extrahepatic biliary ductal dilatation.  Pancreas: Unremarkable  Spleen: Unremarkable  Adrenals/Urinary Tract: Adrenal glands are normal. Kidneys enhance symmetrically with contrast. No hydronephrosis. The urinary bladder is unremarkable.  Stomach/Bowel: Colon is decompressed. No abnormal bowel wall thickening or evidence for bowel obstruction. The appendix is normal. No free fluid or free intraperitoneal air.  Vascular/Lymphatic: Normal caliber abdominal aorta. Multiple sub cm retroperitoneal lymph nodes are demonstrated. Enlarged left external iliac lymph nodes measuring 2.0 cm (image 70; series 2) and 2.1 cm (image 72; series 2). Prominent left inguinal lymph nodes. Adenopathy causes narrowing of left femoral vein.  Other: Extensive soft tissue stranding within the left inguinal region.  Musculoskeletal: No aggressive or acute appearing osseous lesions.  IMPRESSION: Nonspecific soft tissue stranding within the left inguinal region image may be secondary to an infectious, inflammatory or malignant process. There are enlarged left external iliac lymph nodes measuring up to 2.1 cm which may be secondary to an infectious, inflammatory or malignant process.   Electronically Signed   By: Annia Belt M.D.   On: 05/28/2015 18:27    Microbiology: No results found for this or any previous visit (from the past 240 hour(s)).   Labs: Basic Metabolic Panel: No results for input(s): NA, K, CL, CO2, GLUCOSE, BUN,  CREATININE, CALCIUM, MG, PHOS in the last 168 hours. Liver Function Tests: No results for input(s): AST, ALT, ALKPHOS, BILITOT, PROT, ALBUMIN in the last 168 hours. No results for input(s): LIPASE, AMYLASE in the last 168 hours. No results for input(s): AMMONIA in the last 168 hours. CBC: No results for input(s): WBC, NEUTROABS, HGB, HCT, MCV, PLT in the last 168 hours. Cardiac Enzymes: No results for input(s): CKTOTAL, CKMB, CKMBINDEX, TROPONINI in the last 168 hours. BNP: BNP (last 3 results) No results for input(s): BNP in the last 8760 hours.  ProBNP (last 3 results) No results for input(s): PROBNP in the last 8760 hours.  CBG: No results for input(s): GLUCAP in the last 168 hours.   Signed:  Marinda Elk  [facility] 06/14/2015, 2:57 PM

## 2015-06-23 ENCOUNTER — Other Ambulatory Visit: Payer: Self-pay | Admitting: Infectious Diseases

## 2015-07-02 ENCOUNTER — Ambulatory Visit: Payer: Self-pay | Admitting: Internal Medicine

## 2015-07-11 ENCOUNTER — Telehealth: Payer: Self-pay | Admitting: *Deleted

## 2015-07-11 NOTE — Telephone Encounter (Signed)
Patient came in today requesting a copy of his TB skin test. I gave him a copy of the results, but it is from 2014. He needs an updated one for work and I explained that the clinic only requires one and he can check with the Health dept. Advised him to call me and let me know if this is an option and if he can afford it. I may be able to speak with my supervisor if we are the only option for him.

## 2015-07-23 ENCOUNTER — Other Ambulatory Visit: Payer: Self-pay | Admitting: *Deleted

## 2015-07-23 MED ORDER — EMTRICITAB-RILPIVIR-TENOFOV DF 200-25-300 MG PO TABS: 1.0000 | ORAL_TABLET | Freq: Every day | ORAL | Status: DC

## 2015-07-30 ENCOUNTER — Ambulatory Visit (INDEPENDENT_AMBULATORY_CARE_PROVIDER_SITE_OTHER): Payer: Self-pay | Admitting: *Deleted

## 2015-07-30 DIAGNOSIS — Z23 Encounter for immunization: Secondary | ICD-10-CM

## 2015-07-30 DIAGNOSIS — B20 Human immunodeficiency virus [HIV] disease: Secondary | ICD-10-CM

## 2015-08-02 ENCOUNTER — Other Ambulatory Visit: Payer: Self-pay | Admitting: *Deleted

## 2015-08-02 MED ORDER — EMTRICITAB-RILPIVIR-TENOFOV DF 200-25-300 MG PO TABS: 1.0000 | ORAL_TABLET | Freq: Every day | ORAL | Status: DC

## 2015-08-27 ENCOUNTER — Ambulatory Visit: Payer: Self-pay | Admitting: Internal Medicine

## 2015-08-27 ENCOUNTER — Telehealth: Payer: Self-pay | Admitting: *Deleted

## 2015-08-27 NOTE — Telephone Encounter (Signed)
Left message for pt to call for another MD appt and for January 2016 for RW/ADAP.

## 2015-08-28 ENCOUNTER — Telehealth: Payer: Self-pay | Admitting: Internal Medicine

## 2015-08-28 NOTE — Telephone Encounter (Signed)
Left vm for patient to return call, need proof of residency to get his application approved. If he can provide a copy of a valid drivers license or id they will accept that even if it doesn't have the current address. Advised patient on the vm he can bring, email or fax so feel free to give my email if necessary.

## 2015-09-03 ENCOUNTER — Encounter: Payer: Self-pay | Admitting: Internal Medicine

## 2015-09-03 ENCOUNTER — Ambulatory Visit (INDEPENDENT_AMBULATORY_CARE_PROVIDER_SITE_OTHER): Payer: Self-pay | Admitting: Internal Medicine

## 2015-09-03 DIAGNOSIS — B20 Human immunodeficiency virus [HIV] disease: Secondary | ICD-10-CM

## 2015-09-03 MED ORDER — EMTRICITAB-RILPIVIR-TENOFOV AF 200-25-25 MG PO TABS: 1.0000 | ORAL_TABLET | Freq: Every day | ORAL | Status: DC

## 2015-09-03 NOTE — Progress Notes (Signed)
Patient Active Problem List   Diagnosis Date Noted  . Human immunodeficiency virus (HIV) disease (HCC) 11/23/2006    Priority: High  . Inguinal adenopathy 05/28/2015  . Testosterone deficiency 03/29/2015  . Vitamin D deficiency 03/29/2015  . Marijuana abuse 03/29/2015  . Tibial plateau fracture 03/28/2015  . Other chest pain 11/15/2014  . Closed fracture of unspecified phalanx or phalanges of hand 04/04/2014  . Tobacco abuse 02/22/2013  . Essential hypertension, benign 12/24/2009    Patient's Medications  New Prescriptions   EMTRICITABINE-RILPIVIR-TENOFOVIR AF (ODEFSEY) 200-25-25 MG TABS TABLET    Take 1 tablet by mouth daily.  Previous Medications   AMLODIPINE (NORVASC) 5 MG TABLET    TAKE 1 TABLET BY MOUTH DAILY   ASPIRIN EC 325 MG TABLET    Take 1 tablet (325 mg total) by mouth every 12 (twelve) hours.   CHOLECALCIFEROL 1000 UNITS TABLET    Take 1 tablet (1,000 Units total) by mouth every 12 (twelve) hours.   DIPHENHYDRAMINE (BENADRYL) 12.5 MG/5ML LIQUID    Take 25 mg by mouth 4 (four) times daily as needed. Reported on 09/03/2015   VITAMIN C (VITAMIN C) 1000 MG TABLET    Take 1 tablet (1,000 mg total) by mouth daily.   VITAMIN D, ERGOCALCIFEROL, (DRISDOL) 50000 UNITS CAPS CAPSULE    Take 1 capsule (50,000 Units total) by mouth every 7 (seven) days.  Modified Medications   No medications on file  Discontinued Medications   AMOXICILLIN-CLAVULANATE (AUGMENTIN) 200-28.5 MG PER CHEWABLE TABLET    Chew 1 tablet by mouth 2 (two) times daily.   EMTRICITAB-RILPIVIR-TENOFOV DF (COMPLERA) 200-25-300 MG TABS    Take 1 tablet by mouth daily.   EMTRICITAB-RILPIVIR-TENOFOVIR 200-25-300 MG TABS    Take 1 tablet by mouth daily.    Subjective: Clifford Shields is in for his routine HIV follow-up visit. He is followed by my partner, Dr. Merceda Elks. He was started on Complera when he was last seen here in March. He takes it each evening with food. He thinks he may have missed one dose each  month when he had busy and forgot was away from home. He tolerates it well. He was hospitalized in July with a left tibial plateau fracture. He was hospitalized again in September with left inguinal adenopathy. He was treated with IV antibiotics and then left AGAINST MEDICAL ADVICE but states that his adenopathy resolve shortly after discharge. He is feeling well.  Review of Systems: Review of Systems  Constitutional: Negative for fever, chills, weight loss, malaise/fatigue and diaphoresis.  HENT: Negative for sore throat.   Respiratory: Negative for cough, sputum production and shortness of breath.   Cardiovascular: Negative for chest pain.  Gastrointestinal: Negative for nausea, vomiting and diarrhea.  Musculoskeletal: Negative for joint pain.  Skin: Negative for rash.  Psychiatric/Behavioral: Negative for depression and substance abuse. The patient is not nervous/anxious.     Past Medical History  Diagnosis Date  . Hypertension   . HIV disease (HCC)   . Marijuana use     Social History  Substance Use Topics  . Smoking status: Current Every Day Smoker -- 1.00 packs/day for 15 years    Types: Cigarettes  . Smokeless tobacco: Never Used     Comment: not smioking now due to cough  . Alcohol Use: 0.0 oz/week    0 Standard drinks or equivalent per week     Comment: rarely Clifford Shields    Family History  Problem Relation Age  of Onset  . Kidney disease Mother   . Kidney disease Father     No Known Allergies  Objective:  Filed Vitals:   09/03/15 1538  BP: 149/90  Pulse: 65  Temp: 98.3 F (36.8 C)  Weight: 198 lb (89.812 kg)   Body mass index is 31 kg/(m^2).  Physical Exam  Constitutional: He is oriented to person, place, and time.  He is pleasant and appears healthy.  HENT:  Mouth/Throat: No oropharyngeal exudate.  Eyes: Conjunctivae are normal.  Cardiovascular: Normal rate and regular rhythm.   No murmur heard. Pulmonary/Chest: Breath sounds normal.  Neurological: He  is alert and oriented to person, place, and time.  Skin: No rash noted.  Psychiatric: Mood and affect normal.    Lab Results Lab Results  Component Value Date   WBC 10.0 05/30/2015   HGB 10.7* 05/30/2015   HCT 33.4* 05/30/2015   MCV 84.8 05/30/2015   PLT 209 05/30/2015    Lab Results  Component Value Date   CREATININE 1.10 05/30/2015   BUN 7 05/30/2015   NA 138 05/30/2015   K 3.4* 05/30/2015   CL 105 05/30/2015   CO2 27 05/30/2015    Lab Results  Component Value Date   ALT 20 05/29/2015   AST 20 05/29/2015   ALKPHOS 76 05/29/2015   BILITOT 0.6 05/29/2015    Lab Results  Component Value Date   CHOL 141 10/04/2014   HDL 32* 10/04/2014   LDLCALC 74 10/04/2014   TRIG 173* 10/04/2014   CHOLHDL 4.4 10/04/2014    Lab Results HIV 1 RNA QUANT (copies/mL)  Date Value  05/28/2015 <20  03/28/2015 110  10/04/2014 <20   CD4 T CELL ABS (/uL)  Date Value  05/28/2015 610  03/28/2015 520  10/04/2014 680      Problem List Items Addressed This Visit      High   Human immunodeficiency virus (HIV) disease (HCC)    It sounds like his adherence has been good and his most recent lab work indicates that his infection was under good control. I will repeat lab work today. I will switch him to the new, safer version of Complera called Odefsey. He will follow-up in 6 months.      Relevant Medications   emtricitabine-rilpivir-tenofovir AF (ODEFSEY) 200-25-25 MG TABS tablet   Other Relevant Orders   T-helper cell (CD4)- ([facility] clinic only)   HIV 1 RNA quant-no reflex-bld   T-helper cell (CD4)- ([facility] clinic only)   HIV 1 RNA quant-no reflex-bld   CBC   Comprehensive metabolic panel   Lipid panel   RPR        Clifford AstersJohn Keiana Tavella, MD [facility] [phone] pager   [phone] cell 09/03/2015, 4:58 PM

## 2015-09-03 NOTE — Assessment & Plan Note (Addendum)
It sounds like his adherence has been good and his most recent lab work indicates that his infection was under good control. I will repeat lab work today. I will switch him to the new, safer version of Complera called Odefsey. He will follow-up in 6 months.

## 2015-09-05 LAB — T-HELPER CELL (CD4) - (RCID CLINIC ONLY)
CD4 % Helper T Cell: 29 % — ABNORMAL LOW (ref 33–55)
CD4 T Cell Abs: 710 /uL (ref 400–2700)

## 2015-09-06 LAB — HIV-1 RNA QUANT-NO REFLEX-BLD
HIV 1 RNA QUANT: 63 {copies}/mL — AB (ref <20)
HIV-1 RNA QUANT, LOG: 1.8 {Log_copies}/mL — AB (ref <1.30)

## 2015-09-08 ENCOUNTER — Other Ambulatory Visit: Payer: Self-pay | Admitting: Infectious Diseases

## 2015-09-18 ENCOUNTER — Ambulatory Visit: Payer: Self-pay

## 2015-09-18 ENCOUNTER — Ambulatory Visit: Payer: Self-pay | Admitting: Internal Medicine

## 2016-02-05 ENCOUNTER — Telehealth: Payer: Self-pay | Admitting: *Deleted

## 2016-02-05 NOTE — Telephone Encounter (Signed)
[facility]  Faxed. Andree CossHowell, Kamsiyochukwu Spickler M, RN

## 2016-02-27 ENCOUNTER — Other Ambulatory Visit: Payer: Self-pay

## 2016-03-12 ENCOUNTER — Ambulatory Visit: Payer: Self-pay | Admitting: Internal Medicine

## 2016-03-23 ENCOUNTER — Encounter: Payer: Self-pay | Admitting: Internal Medicine

## 2016-04-15 ENCOUNTER — Other Ambulatory Visit: Payer: Self-pay

## 2016-04-29 ENCOUNTER — Ambulatory Visit: Payer: Self-pay | Admitting: Internal Medicine

## 2016-05-09 ENCOUNTER — Other Ambulatory Visit: Payer: Self-pay | Admitting: Infectious Disease

## 2016-06-08 ENCOUNTER — Ambulatory Visit (HOSPITAL_COMMUNITY)
Admission: EM | Admit: 2016-06-08 | Discharge: 2016-06-08 | Disposition: A | Discharge status: Home / Self Care | Payer: Self-pay | Attending: Family Medicine | Admitting: Family Medicine

## 2016-06-08 ENCOUNTER — Encounter (HOSPITAL_COMMUNITY): Payer: Self-pay | Admitting: Emergency Medicine

## 2016-06-08 ENCOUNTER — Ambulatory Visit (INDEPENDENT_AMBULATORY_CARE_PROVIDER_SITE_OTHER): Payer: Self-pay

## 2016-06-08 DIAGNOSIS — S8392XA Sprain of unspecified site of left knee, initial encounter: Secondary | ICD-10-CM

## 2016-06-08 DIAGNOSIS — M545 Low back pain, unspecified: Secondary | ICD-10-CM

## 2016-06-08 MED ORDER — IBUPROFEN 800 MG PO TABS
800.0000 mg | ORAL_TABLET | Freq: Once | ORAL | Status: AC
  Administered 2016-06-08: 800 mg via ORAL

## 2016-06-08 MED ORDER — DICLOFENAC POTASSIUM 50 MG PO TABS: 50.0000 mg | ORAL_TABLET | Freq: Three times a day (TID) | ORAL | 0 refills | Status: DC

## 2016-06-08 MED ORDER — CYCLOBENZAPRINE HCL 10 MG PO TABS: 10.0000 mg | ORAL_TABLET | Freq: Two times a day (BID) | ORAL | 0 refills | Status: DC | PRN

## 2016-06-08 MED ORDER — OXYCODONE-ACETAMINOPHEN 5-325 MG PO TABS: 2.0000 | ORAL_TABLET | ORAL | 0 refills | Status: DC | PRN

## 2016-06-08 MED ORDER — IBUPROFEN 800 MG PO TABS
ORAL_TABLET | ORAL | Status: AC
  Filled 2016-06-08: qty 1

## 2016-06-08 NOTE — ED Triage Notes (Signed)
[facility] a [facility] of lower back pain and left knee pain secondary to a fall that occurred today.

## 2016-06-08 NOTE — ED Provider Notes (Signed)
CSN: [id number]     Arrival date & time 06/08/16  1811 History   First MD Initiated Contact with Patient 06/08/16 1945     Chief Complaint  Patient presents with  . Fall   (Consider location/radiation/quality/duration/timing/severity/associated sxs/prior Treatment) HPI Patient is a 30 year old man who states that he slipped and fell this morning twisting his left knee that he has had previous surgery on from a tib-fib fracture and wrenching his back. He has had low back pain most of the day. He states that he has not used any medications at home. He states his back is extremely stiff at this time. Past Medical History:  Diagnosis Date  . HIV disease (HCC)   . Hypertension   . Marijuana use    Past Surgical History:  Procedure Laterality Date  . ORIF TIBIA PLATEAU Left 03/28/2015   Procedure: OPEN REDUCTION INTERNAL FIXATION (ORIF) LEFT TIBIAL PLATEAU FRACTURE;  Surgeon: Myrene GalasMichael Handy, MD;  Location: [facility] OR;  Service: Orthopedics;  Laterality: Left;   Family History  Problem Relation Age of Onset  . Kidney disease Mother   . Kidney disease Father    Social History  Substance Use Topics  . Smoking status: Current Every Day Smoker    Packs/day: 1.00    Years: 15.00    Types: Cigarettes  . Smokeless tobacco: Never Used     Comment: not smioking now due to cough  . Alcohol use 0.0 oz/week     Comment: rarely [name]/vodka    Review of Systems  Denies: HEADACHE, NAUSEA, ABDOMINAL PAIN, CHEST PAIN, CONGESTION, DYSURIA, SHORTNESS OF BREATH  Allergies  Review of patient's allergies indicates no known allergies.  Home Medications   Prior to Admission medications   Medication Sig Start Date End Date Taking? Authorizing Provider  amLODipine (NORVASC) 5 MG tablet TAKE 1 TABLET BY MOUTH DAILY 05/11/16  Yes Gardiner Barefootobert W Comer, MD  aspirin EC 325 MG tablet Take 1 tablet (325 mg total) by mouth every 12 (twelve) hours. 03/29/15   Montez MoritaKeith Paul, PA-C  cholecalciferol 1000 UNITS tablet Take 1 tablet  (1,000 Units total) by mouth every 12 (twelve) hours. 03/29/15   Montez MoritaKeith Paul, PA-C  cyclobenzaprine (FLEXERIL) 10 MG tablet Take 1 tablet (10 mg total) by mouth 2 (two) times daily as needed for muscle spasms. 06/08/16   Tharon AquasFrank C Abbegail Matuska, PA  diclofenac (CATAFLAM) 50 MG tablet Take 1 tablet (50 mg total) by mouth 3 (three) times daily. 06/08/16   Tharon AquasFrank C Keiko Myricks, PA  diphenhydrAMINE (BENADRYL) 12.5 MG/5ML liquid Take 25 mg by mouth 4 (four) times daily as needed. Reported on 09/03/2015    Historical Provider, MD  emtricitabine-rilpivir-tenofovir AF (ODEFSEY) 200-25-25 MG TABS tablet Take 1 tablet by mouth daily. 09/03/15   Cliffton AstersJohn Campbell, MD  oxyCODONE-acetaminophen (PERCOCET/ROXICET) 5-325 MG tablet Take 2 tablets by mouth every 4 (four) hours as needed for severe pain. 06/08/16   Tharon AquasFrank C Lashe Oliveira, PA  vitamin C (VITAMIN C) 1000 MG tablet Take 1 tablet (1,000 mg total) by mouth daily. 03/29/15   Montez MoritaKeith Paul, PA-C  Vitamin D, Ergocalciferol, (DRISDOL) 50000 UNITS CAPS capsule Take 1 capsule (50,000 Units total) by mouth every 7 (seven) days. 03/29/15   Montez MoritaKeith Paul, PA-C   Meds Ordered and Administered this Visit   Medications  ibuprofen (ADVIL,MOTRIN) tablet 800 mg (800 mg Oral Given 06/08/16 2032)    BP 145/89 (BP Location: Left Arm)   Pulse 76   Temp 98.4 F (36.9 C) (Oral)   Resp 12  SpO2 100%  No data found.   Physical Exam NURSES NOTES AND VITAL SIGNS REVIEWED. CONSTITUTIONAL: Well developed, well nourished, no acute distress HEENT: normocephalic, atraumatic EYES: Conjunctiva normal NECK:normal ROM, supple, no adenopathy PULMONARY:No respiratory distress, normal effort ABDOMINAL: Soft, ND, NT BS+, No CVAT MUSCULOSKELETAL: Normal ROM of all extremities, back examination there is palpable tenderness in the lumbar spine midline and palpable spasm in the lower lumbar muscles. Left knee there is a healed scar. There is no visible or palpable effusion or deformity. SKIN: warm and dry without  rash PSYCHIATRIC: Mood and affect, behavior are normal  Urgent Care Course   Clinical Course    Procedures (including critical care time)  Labs Review Labs Reviewed - No data to display  Imaging Review Dg Lumbar Spine Complete  Result Date: 06/08/2016 CLINICAL DATA:  Status post fall, with lower back pain. Initial encounter. EXAM: LUMBAR SPINE - COMPLETE 4+ VIEW COMPARISON:  CT of the abdomen and pelvis performed 05/28/2015 FINDINGS: There is no evidence of fracture or subluxation. Vertebral bodies demonstrate normal height and alignment. Intervertebral disc spaces are preserved. The visualized neural foramina are grossly unremarkable in appearance. The visualized bowel gas pattern is unremarkable in appearance; air and stool are noted within the colon. The sacroiliac joints are within normal limits. IMPRESSION: No evidence of fracture or subluxation along the lumbar spine. Electronically Signed   By: Roanna Raider M.D.   On: 06/08/2016 20:41   Dg Knee Complete 4 Views Left  Result Date: 06/08/2016 CLINICAL DATA:  Status post fall, with left knee pain. Initial encounter. EXAM: LEFT KNEE - COMPLETE 4+ VIEW COMPARISON:  Left knee radiographs performed 03/28/2015 FINDINGS: There is no evidence of fracture or dislocation. The joint spaces are preserved. No significant degenerative change is seen; the patellofemoral joint is grossly unremarkable in appearance. The plate and screws along the proximal tibia appear grossly intact, without evidence of loosening. There is mild chronic deformity of the fibular head. No significant joint effusion is seen. The visualized soft tissues are normal in appearance. IMPRESSION: No evidence of fracture or dislocation. Electronically Signed   By: Roanna Raider M.D.   On: 06/08/2016 20:39   X-rays are discussed with patient and his wife prior to discharge.  Visual Acuity Review  Right Eye Distance:   Left Eye Distance:   Bilateral Distance:    Right Eye  Near:   Left Eye Near:    Bilateral Near:        Prescriptions were sent to pharmacy of patient's choice. MDM   1. Acute midline low back pain without sciatica   2. Left knee sprain, initial encounter     Denies: HEADACHE, NAUSEA, ABDOMINAL PAIN, CHEST PAIN, CONGESTION, DYSURIA, SHORTNESS OF BREATH     Tharon Aquas, [place] 06/08/16 2124

## 2016-07-10 ENCOUNTER — Other Ambulatory Visit: Payer: Self-pay | Admitting: Internal Medicine

## 2016-07-13 ENCOUNTER — Other Ambulatory Visit: Payer: Self-pay | Admitting: *Deleted

## 2016-07-13 DIAGNOSIS — I10 Essential (primary) hypertension: Secondary | ICD-10-CM

## 2016-07-13 MED ORDER — AMLODIPINE BESYLATE 5 MG PO TABS: 5.0000 mg | ORAL_TABLET | Freq: Every day | ORAL | 1 refills | Status: DC

## 2016-07-13 NOTE — Telephone Encounter (Signed)
Left message for the patient.  Appt with Dr. Luciana Axeomer made for 08/11/16 @ 4:15 PM

## 2016-08-11 ENCOUNTER — Ambulatory Visit: Payer: Self-pay | Admitting: Internal Medicine

## 2016-08-17 ENCOUNTER — Emergency Department (HOSPITAL_COMMUNITY)
Admission: EM | Admit: 2016-08-17 | Discharge: 2016-08-18 | Disposition: A | Discharge status: Home / Self Care | Payer: Self-pay | Attending: Emergency Medicine | Admitting: Emergency Medicine

## 2016-08-17 ENCOUNTER — Encounter (HOSPITAL_COMMUNITY): Payer: Self-pay | Admitting: *Deleted

## 2016-08-17 DIAGNOSIS — T22212A Burn of second degree of left forearm, initial encounter: Secondary | ICD-10-CM | POA: Insufficient documentation

## 2016-08-17 DIAGNOSIS — F17210 Nicotine dependence, cigarettes, uncomplicated: Secondary | ICD-10-CM | POA: Insufficient documentation

## 2016-08-17 DIAGNOSIS — Y990 Civilian activity done for income or pay: Secondary | ICD-10-CM | POA: Insufficient documentation

## 2016-08-17 DIAGNOSIS — Z7982 Long term (current) use of aspirin: Secondary | ICD-10-CM | POA: Insufficient documentation

## 2016-08-17 DIAGNOSIS — I10 Essential (primary) hypertension: Secondary | ICD-10-CM | POA: Insufficient documentation

## 2016-08-17 DIAGNOSIS — X12XXXA Contact with other hot fluids, initial encounter: Secondary | ICD-10-CM | POA: Insufficient documentation

## 2016-08-17 DIAGNOSIS — Y9289 Other specified places as the place of occurrence of the external cause: Secondary | ICD-10-CM | POA: Insufficient documentation

## 2016-08-17 DIAGNOSIS — Y9389 Activity, other specified: Secondary | ICD-10-CM | POA: Insufficient documentation

## 2016-08-17 MED ORDER — HYDROMORPHONE HCL 2 MG/ML IJ SOLN
2.0000 mg | Freq: Once | INTRAMUSCULAR | Status: AC
  Administered 2016-08-17: 2 mg via INTRAMUSCULAR
  Filled 2016-08-17: qty 1

## 2016-08-17 MED ORDER — OXYCODONE-ACETAMINOPHEN 5-325 MG PO TABS: 2.0000 | ORAL_TABLET | ORAL | 0 refills | Status: DC | PRN

## 2016-08-17 MED ORDER — SILVER SULFADIAZINE 1 % EX CREA
TOPICAL_CREAM | Freq: Once | CUTANEOUS | Status: AC
  Administered 2016-08-18: via TOPICAL
  Filled 2016-08-17: qty 85

## 2016-08-17 MED ORDER — ONDANSETRON HCL 4 MG/2ML IJ SOLN
4.0000 mg | Freq: Once | INTRAMUSCULAR | Status: AC
  Administered 2016-08-17: 4 mg via INTRAMUSCULAR
  Filled 2016-08-17: qty 2

## 2016-08-17 NOTE — Discharge Instructions (Signed)
Recheck at Urgent care in 2 days  
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
## 2016-08-21 ENCOUNTER — Encounter (HOSPITAL_COMMUNITY): Payer: Self-pay | Admitting: Emergency Medicine

## 2016-08-21 ENCOUNTER — Ambulatory Visit (HOSPITAL_COMMUNITY)
Admission: EM | Admit: 2016-08-21 | Discharge: 2016-08-21 | Disposition: A | Discharge status: Home / Self Care | Payer: Self-pay | Attending: Family Medicine | Admitting: Family Medicine

## 2016-08-21 DIAGNOSIS — T22212D Burn of second degree of left forearm, subsequent encounter: Secondary | ICD-10-CM

## 2016-08-21 DIAGNOSIS — Z09 Encounter for follow-up examination after completed treatment for conditions other than malignant neoplasm: Secondary | ICD-10-CM

## 2016-08-21 DIAGNOSIS — T22212A Burn of second degree of left forearm, initial encounter: Secondary | ICD-10-CM

## 2016-08-21 DIAGNOSIS — T23262D Burn of second degree of back of left hand, subsequent encounter: Secondary | ICD-10-CM

## 2016-08-21 DIAGNOSIS — T23262A Burn of second degree of back of left hand, initial encounter: Secondary | ICD-10-CM

## 2016-08-21 NOTE — ED Triage Notes (Signed)
Here for a f/u on burn to left arm  Reports it's getting better  Voices no new concerns... A&O x4... NAD
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
## 2016-09-13 ENCOUNTER — Other Ambulatory Visit: Payer: Self-pay | Admitting: Internal Medicine

## 2016-09-13 DIAGNOSIS — B20 Human immunodeficiency virus [HIV] disease: Secondary | ICD-10-CM

## 2016-09-17 ENCOUNTER — Ambulatory Visit: Payer: Self-pay
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
## 2016-09-26 ENCOUNTER — Other Ambulatory Visit: Payer: Self-pay | Admitting: Internal Medicine

## 2016-09-26 DIAGNOSIS — B20 Human immunodeficiency virus [HIV] disease: Secondary | ICD-10-CM

## 2016-10-05 IMAGING — CR DG KNEE 1-2V PORT*L*
2 series · 2 of 2 positions shown · non-contrast
Comparison: CT left knee March 17, 2015; intraoperative left knee
March 28, 2015

CLINICAL DATA: Status post repair of tibial plateau fracture

EXAM:
PORTABLE LEFT KNEE - 1-2 VIEW

[AP]
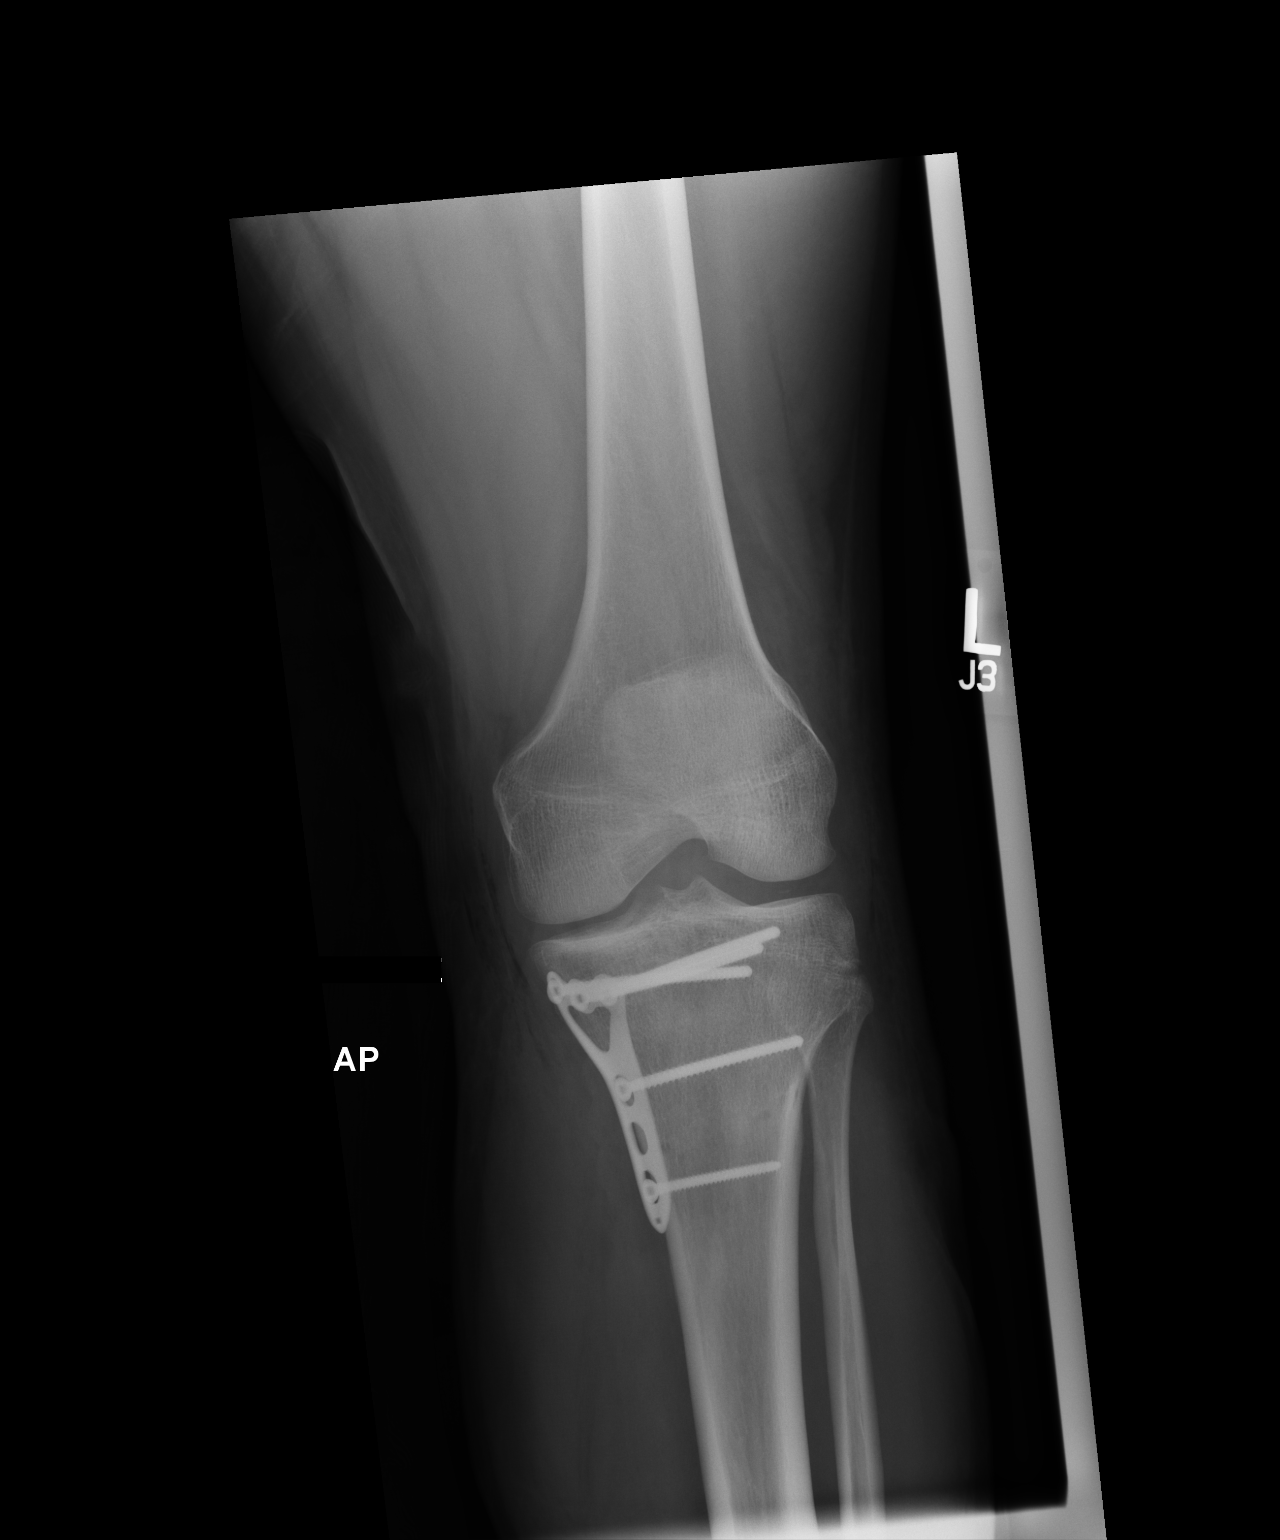

[xtable lateral]
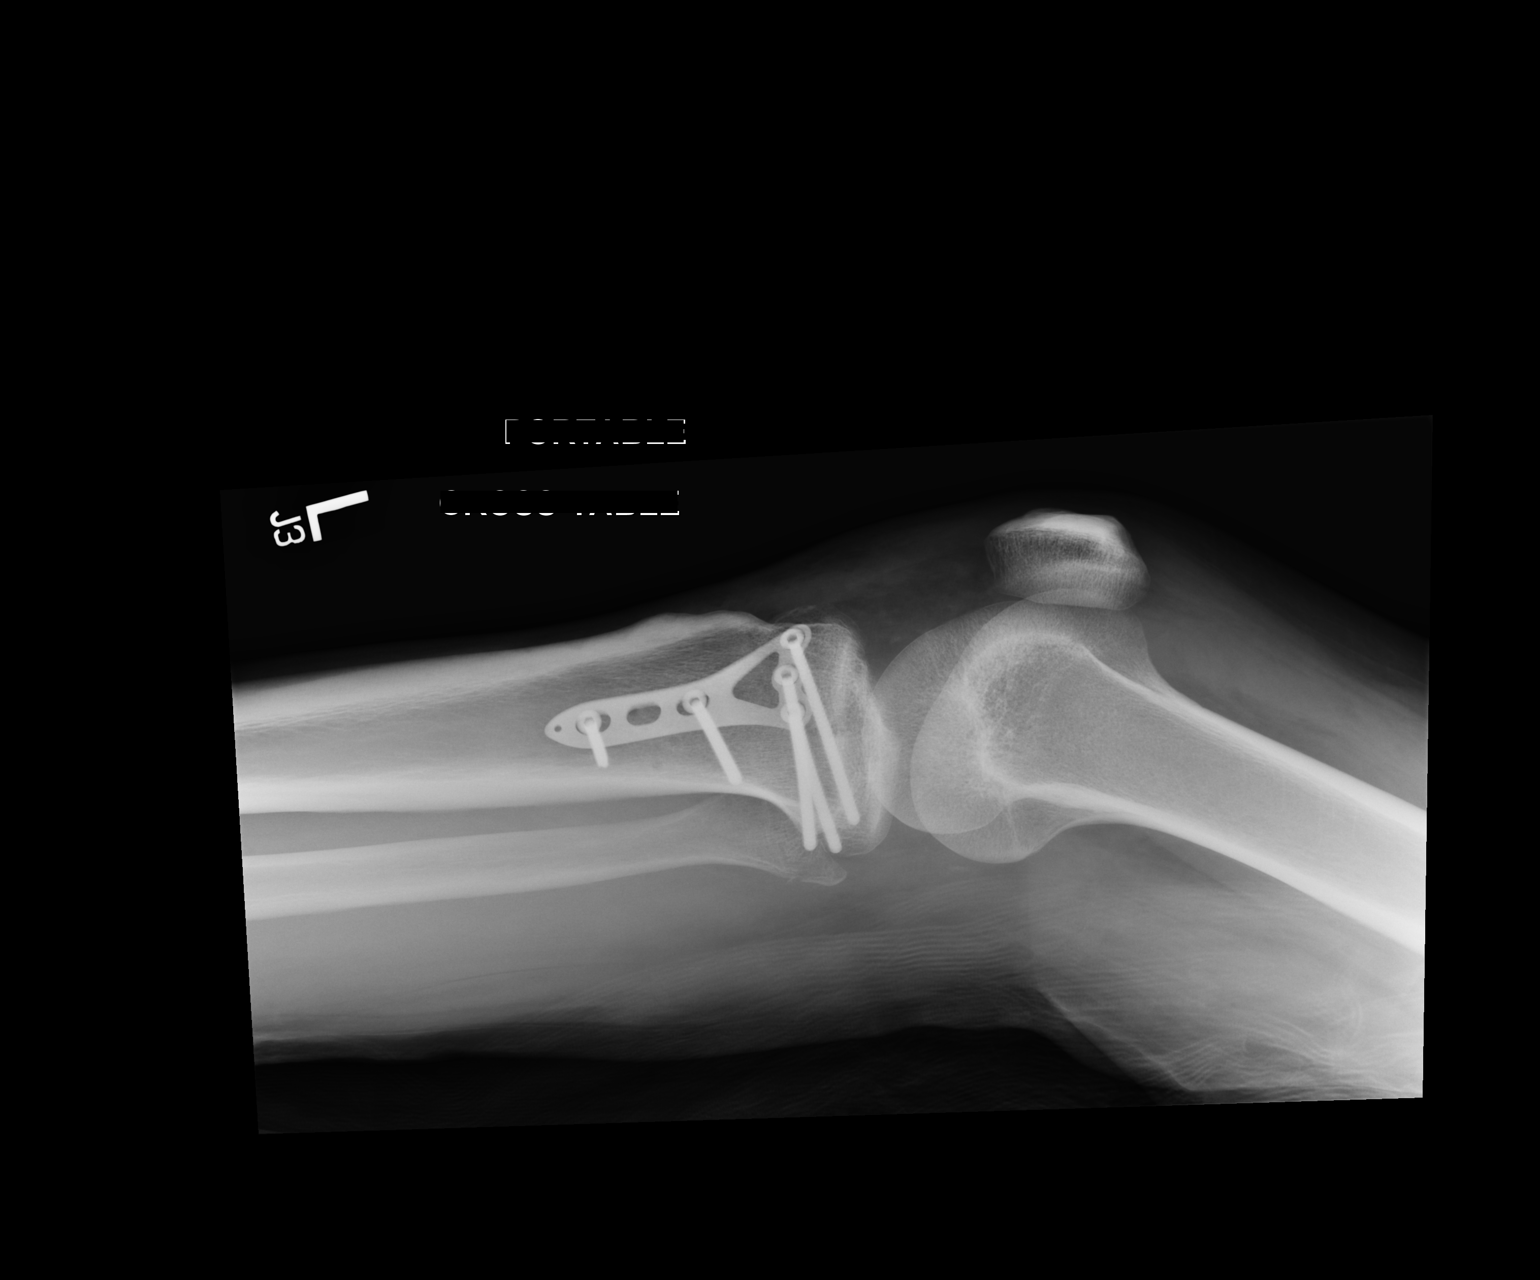

[2 of 2 positions shown; findings below may reference images not displayed]

FINDINGS: Frontal and lateral views were obtained. There is screw and plate
fixation through a tibial plateau region fracture on the left.
Alignment at the fracture site is essentially anatomic. No new
fracture. No dislocation. No appreciable joint effusion.
IMPRESSION: Alignment essentially anatomic at the site of postoperative fixation
for proximal tibial fracture. No new fracture or dislocation.

## 2016-10-19 ENCOUNTER — Other Ambulatory Visit (INDEPENDENT_AMBULATORY_CARE_PROVIDER_SITE_OTHER): Payer: Self-pay

## 2016-10-19 ENCOUNTER — Telehealth: Payer: Self-pay

## 2016-10-19 ENCOUNTER — Ambulatory Visit (INDEPENDENT_AMBULATORY_CARE_PROVIDER_SITE_OTHER): Payer: Self-pay

## 2016-10-19 ENCOUNTER — Ambulatory Visit: Payer: Self-pay

## 2016-10-19 DIAGNOSIS — Z202 Contact with and (suspected) exposure to infections with a predominantly sexual mode of transmission: Secondary | ICD-10-CM

## 2016-10-19 DIAGNOSIS — Z113 Encounter for screening for infections with a predominantly sexual mode of transmission: Secondary | ICD-10-CM

## 2016-10-19 MED ORDER — PENICILLIN G BENZATHINE 1200000 UNIT/2ML IM SUSP
1.2000 10*6.[IU] | Freq: Once | INTRAMUSCULAR | Status: AC
  Administered 2016-10-19 (×2): 1.2 10*6.[IU] via INTRAMUSCULAR

## 2016-10-19 MED ORDER — PENICILLIN G BENZATHINE 1200000 UNIT/2ML IM SUSP: 1.2000 10*6.[IU] | Freq: Once | INTRAMUSCULAR | Status: DC

## 2016-10-19 NOTE — Telephone Encounter (Signed)
Patient walked into clinic with complaint of rash on hands and feet since starting Odefsey .    Rash itching and dry.  Upon examination patient has bilateral chancre on hand and feet . Patient states symptoms x 6 months with increased itching.  He denies penile drainage/discharge or pain with urination.  There has been no new partners in the last 6 months.  He is the bottom partner.   Cultures taken from penis, oral and rectal.  Urine ancillary and RPR performed.    Per Dr Luciana Axeomer patient was treated with 2.4 mil bicillin X one.    Patient advised to call the office in 3 days for culture results.    Laurell Josephsammy K King, RN

## 2016-10-20 ENCOUNTER — Telehealth: Payer: Self-pay

## 2016-10-20 LAB — RPR

## 2016-10-21 LAB — CYTOLOGY, (ORAL, ANAL, URETHRAL) ANCILLARY ONLY
CHLAMYDIA, DNA PROBE: NEGATIVE
CHLAMYDIA, DNA PROBE: POSITIVE — AB
Chlamydia: NEGATIVE
NEISSERIA GONORRHEA: NEGATIVE
NEISSERIA GONORRHEA: NEGATIVE
Neisseria Gonorrhea: NEGATIVE

## 2016-10-21 LAB — URINE CYTOLOGY ANCILLARY ONLY
Chlamydia: POSITIVE — AB
Neisseria Gonorrhea: NEGATIVE

## 2016-10-22 ENCOUNTER — Other Ambulatory Visit: Payer: Self-pay | Admitting: Internal Medicine

## 2016-10-22 MED ORDER — AZITHROMYCIN 500 MG PO TABS: 1000.0000 mg | ORAL_TABLET | Freq: Once | ORAL | 0 refills | Status: AC

## 2016-10-23 ENCOUNTER — Telehealth: Payer: Self-pay | Admitting: *Deleted

## 2016-10-23 NOTE — Telephone Encounter (Signed)
Called patient and left a voice mail for him to call the clinic for lab results. Positive chlamydia and Dr.Comer has sent azithromycin to his pharmacy. This info was not left on the voice mail.

## 2016-10-27 NOTE — Progress Notes (Unsigned)
Left message for patient to call.

## 2016-11-05 ENCOUNTER — Encounter: Payer: Self-pay | Admitting: Internal Medicine

## 2016-11-05 ENCOUNTER — Ambulatory Visit (INDEPENDENT_AMBULATORY_CARE_PROVIDER_SITE_OTHER): Payer: Self-pay | Admitting: Internal Medicine

## 2016-11-05 ENCOUNTER — Other Ambulatory Visit: Payer: Self-pay

## 2016-11-05 VITALS — BP 140/95 | HR 65 | Temp 98.5°F | Ht 70.0 in | Wt 212.0 lb

## 2016-11-05 DIAGNOSIS — Z23 Encounter for immunization: Secondary | ICD-10-CM

## 2016-11-05 DIAGNOSIS — A749 Chlamydial infection, unspecified: Secondary | ICD-10-CM | POA: Insufficient documentation

## 2016-11-05 DIAGNOSIS — B20 Human immunodeficiency virus [HIV] disease: Secondary | ICD-10-CM

## 2016-11-05 DIAGNOSIS — Z113 Encounter for screening for infections with a predominantly sexual mode of transmission: Secondary | ICD-10-CM

## 2016-11-05 DIAGNOSIS — Z720 Tobacco use: Secondary | ICD-10-CM

## 2016-11-05 DIAGNOSIS — R21 Rash and other nonspecific skin eruption: Secondary | ICD-10-CM | POA: Insufficient documentation

## 2016-11-05 LAB — COMPLETE METABOLIC PANEL WITH GFR
ALBUMIN: 4.3 g/dL (ref 3.6–5.1)
ALK PHOS: 65 U/L (ref 40–115)
ALT: 22 U/L (ref 9–46)
AST: 24 U/L (ref 10–40)
BILIRUBIN TOTAL: 0.3 mg/dL (ref 0.2–1.2)
BUN: 9 mg/dL (ref 7–25)
CO2: 27 mmol/L (ref 20–31)
CREATININE: 1.14 mg/dL (ref 0.60–1.35)
Calcium: 9.1 mg/dL (ref 8.6–10.3)
Chloride: 109 mmol/L (ref 98–110)
GFR, EST NON AFRICAN AMERICAN: 86 mL/min (ref >=60)
Glucose, Bld: 93 mg/dL (ref 65–99)
Potassium: 4.2 mmol/L (ref 3.5–5.3)
Sodium: 142 mmol/L (ref 135–146)
TOTAL PROTEIN: 7.3 g/dL (ref 6.1–8.1)

## 2016-11-05 NOTE — Assessment & Plan Note (Signed)
I suspect latex allergy and he will try non latex gloves at work,  Non latex condoms I will recheck rpr

## 2016-11-05 NOTE — Assessment & Plan Note (Signed)
Encouraged cessation.

## 2016-11-05 NOTE — Assessment & Plan Note (Signed)
I am concerned with resistance.  I will check a genotype today and if ok have him start Genvoya, if any resistance, will likely do Genvoya + Prezista.  He is aware.  He will not start until genotype is available and will be called He will then come about 4 weeks later for labs and 5-6 weeks with me

## 2016-11-05 NOTE — Assessment & Plan Note (Signed)
He will go to the pharmacy today and pick up azithromycin

## 2016-11-05 NOTE — Progress Notes (Signed)
CC: Follow up for HIV  Interval history: He comes in with several complaints.  He has not been here in over 1 year and last saw Dr. Orvan Falconerampbell.  I had [facility] him [facility] and he since has reported difficulty with fatigue he attributes to [facility].  He in fact has been sporadically taking it, missing days, stopping.  He also has had a rash on his hands that is itchy, with small papules on his palms and over the thumb.  His recent RPR was negative.  He was diagnosed with chlmaydia but has not yet picked up the azithromycin I sent to his pharmacy.  He has been difficult to reach by phone.  He works at [employer] and wears [employer] gloves.  He does also note a rash on his penis at times when using latex condoms.    Prior to Admission medications   Medication Sig Start Date End Date Taking? Authorizing Provider  amLODipine (NORVASC) 5 MG tablet Take 1 tablet (5 mg total) by mouth daily. 07/13/16  Yes Gardiner Barefootobert W Kaja Jackowski, MD  aspirin EC 325 MG tablet Take 1 tablet (325 mg total) by mouth every 12 (twelve) hours. 03/29/15  Yes Montez MoritaKeith Paul, PA-C  cholecalciferol 1000 UNITS tablet Take 1 tablet (1,000 Units total) by mouth every 12 (twelve) hours. 03/29/15  Yes Montez MoritaKeith Paul, PA-C  diphenhydrAMINE (BENADRYL) 12.5 MG/5ML liquid Take 25 mg by mouth 4 (four) times daily as needed. Reported on 09/03/2015   Yes Historical Provider, MD  cyclobenzaprine (FLEXERIL) 10 MG tablet Take 1 tablet (10 mg total) by mouth 2 (two) times daily as needed for muscle spasms. Patient not taking: Reported on 11/05/2016 06/08/16   Tharon AquasFrank C Patrick, PA  diclofenac (CATAFLAM) 50 MG tablet Take 1 tablet (50 mg total) by mouth 3 (three) times daily. Patient not taking: Reported on 11/05/2016 06/08/16   Tharon AquasFrank C Patrick, PA  emtricitabine-rilpivir-tenofovir AF (ODEFSEY) 200-25-25 MG TABS tablet Take 1 tablet by mouth daily. Patient not taking: Reported on 11/05/2016 09/03/15   Cliffton AstersJohn Campbell, MD  oxyCODONE-acetaminophen (PERCOCET/ROXICET)  5-325 MG tablet Take 2 tablets by mouth every 4 (four) hours as needed for severe pain. Patient not taking: Reported on 11/05/2016 08/17/16   Elson AreasLeslie K Sofia, PA-C  vitamin C (VITAMIN C) 1000 MG tablet Take 1 tablet (1,000 mg total) by mouth daily. Patient not taking: Reported on 11/05/2016 03/29/15   Montez MoritaKeith Paul, PA-C  Vitamin D, Ergocalciferol, (DRISDOL) 50000 UNITS CAPS capsule Take 1 capsule (50,000 Units total) by mouth every 7 (seven) days. Patient not taking: Reported on 11/05/2016 03/29/15   Montez MoritaKeith Paul, PA-C    Review of Systems Constitutional: positive for fatigue or negative for anorexia and weight loss Respiratory: negative for cough Integument/breast: positive for rash and pruritus, negative for skin lesion(s) Musculoskeletal: negative for myalgias and arthralgias  Physical Exam: CONSTITUTIONAL:in no apparent distress and alert  Vitals:   11/05/16 0915  BP: (!) 140/95  Pulse: 65  Temp: 98.5 F (36.9 C)   Eyes: anicteric HENT: no thrush, no cervical lymphadenopathy Respiratory: Normal respiratory effort; CTA B Skin: palms with macular rash on both palms, over thumb, pruritic  Lab Results  Component Value Date   HIV1RNAQUANT 63 (H) 09/03/2015   HIV1RNAQUANT <20 05/28/2015   HIV1RNAQUANT 110 03/28/2015   No components found for: HIV1GENOTYPRPLUS No components found for: THELPERCELL  SH: + tobacco

## 2016-11-05 NOTE — Addendum Note (Signed)
Addended by: Linnell FullingBRANNON, LATOYA N on: 11/05/2016 02:10 PM   Modules accepted: Orders, SmartSet

## 2016-11-06 LAB — T-HELPER CELL (CD4) - (RCID CLINIC ONLY)
CD4 T CELL HELPER: 32 % — AB (ref 33–55)
CD4 T Cell Abs: 900 /uL (ref 400–2700)

## 2016-11-06 LAB — RPR

## 2016-11-07 LAB — HIV-1 RNA,QN PCR W/REFLEX GENOTYPE: HIV-1 RNA, QN PCR: <20 Copies/mL — ABNORMAL HIGH

## 2016-11-08 ENCOUNTER — Other Ambulatory Visit: Payer: Self-pay | Admitting: Internal Medicine

## 2016-11-08 MED ORDER — ELVITEG-COBIC-EMTRICIT-TENOFAF 150-150-200-10 MG PO TABS: 1.0000 | ORAL_TABLET | Freq: Every day | ORAL | 5 refills | Status: DC

## 2016-11-09 ENCOUNTER — Telehealth: Payer: Self-pay | Admitting: *Deleted

## 2016-11-09 NOTE — Telephone Encounter (Signed)
-----   Message from Gardiner Barefootobert W Comer, MD sent at 11/08/2016  9:09 AM EST ----- He does not have any resistance noted from recent labs so can start Genvoya once daily, which has been sent in.  He already has labs scheduled for about 4 weeks. thanks

## 2016-11-09 NOTE — Telephone Encounter (Signed)
[facility]  Left message for patient to pick up refill.

## 2016-12-04 ENCOUNTER — Other Ambulatory Visit: Payer: Self-pay | Admitting: Internal Medicine

## 2016-12-04 DIAGNOSIS — I10 Essential (primary) hypertension: Secondary | ICD-10-CM

## 2016-12-22 ENCOUNTER — Ambulatory Visit: Payer: Self-pay | Admitting: Internal Medicine

## 2017-01-12 ENCOUNTER — Ambulatory Visit: Payer: Self-pay | Admitting: Internal Medicine

## 2017-05-24 ENCOUNTER — Encounter (HOSPITAL_COMMUNITY): Payer: Self-pay | Admitting: *Deleted

## 2017-05-24 ENCOUNTER — Ambulatory Visit (HOSPITAL_COMMUNITY)
Admission: EM | Admit: 2017-05-24 | Discharge: 2017-05-24 | Disposition: A | Discharge status: Home / Self Care | Payer: Self-pay | Attending: Family Medicine | Admitting: Family Medicine

## 2017-05-24 DIAGNOSIS — B20 Human immunodeficiency virus [HIV] disease: Secondary | ICD-10-CM

## 2017-05-24 DIAGNOSIS — N489 Disorder of penis, unspecified: Secondary | ICD-10-CM

## 2017-05-24 DIAGNOSIS — Z21 Asymptomatic human immunodeficiency virus [HIV] infection status: Secondary | ICD-10-CM | POA: Insufficient documentation

## 2017-05-24 DIAGNOSIS — N485 Ulcer of penis: Secondary | ICD-10-CM

## 2017-05-24 DIAGNOSIS — N509 Disorder of male genital organs, unspecified: Secondary | ICD-10-CM | POA: Insufficient documentation

## 2017-05-24 DIAGNOSIS — R1909 Other intra-abdominal and pelvic swelling, mass and lump: Secondary | ICD-10-CM | POA: Insufficient documentation

## 2017-05-24 DIAGNOSIS — R590 Localized enlarged lymph nodes: Secondary | ICD-10-CM

## 2017-05-24 MED ORDER — PENICILLIN G BENZATHINE 1200000 UNIT/2ML IM SUSP
INTRAMUSCULAR | Status: AC
  Filled 2017-05-24: qty 2

## 2017-05-24 MED ORDER — STERILE WATER FOR INJECTION IJ SOLN
INTRAMUSCULAR | Status: AC
  Filled 2017-05-24: qty 10

## 2017-05-24 MED ORDER — AZITHROMYCIN 250 MG PO TABS
1000.0000 mg | ORAL_TABLET | Freq: Once | ORAL | Status: AC
  Administered 2017-05-24: 1000 mg via ORAL

## 2017-05-24 MED ORDER — PENICILLIN G BENZATHINE 1200000 UNIT/2ML IM SUSP
2.4000 10*6.[IU] | Freq: Once | INTRAMUSCULAR | Status: AC
  Administered 2017-05-24: 2.4 10*6.[IU] via INTRAMUSCULAR

## 2017-05-24 MED ORDER — AZITHROMYCIN 250 MG PO TABS
ORAL_TABLET | ORAL | Status: AC
Start: 2017-05-24 — End: 2017-05-24
  Filled 2017-05-24: qty 4

## 2017-05-24 MED ORDER — CEFTRIAXONE SODIUM 250 MG IJ SOLR
INTRAMUSCULAR | Status: AC
  Filled 2017-05-24: qty 250

## 2017-05-24 MED ORDER — CEFTRIAXONE SODIUM 250 MG IJ SOLR
250.0000 mg | Freq: Once | INTRAMUSCULAR | Status: AC
  Administered 2017-05-24: 250 mg via INTRAMUSCULAR

## 2017-05-24 NOTE — Discharge Instructions (Signed)
You were treated empirically for gonorrhea and Chlamydia. Right given injection for syphilis. Cytology and blood work sent, you will be contacted with any positive results that requires further treatment. Refrain from sexual activity for the next 7 days. Follow-up with infectious disease doctor for further evaluation and treatment. Monitor for any worsening of symptoms, fever, abdominal pain, nausea, vomiting, to follow up for reevaluation.

## 2017-05-24 NOTE — ED Provider Notes (Signed)
[facility]    CSN: [id number] Arrival date & time: 05/24/17  1104     History   Chief Complaint Chief Complaint  Patient presents with  . Groin Swelling    HPI Trilby Drummerntoine Canelo is a 31 y.o. male.   31 year old male with history of HIV, hypertension comes in for 4 day history of swelling of the right groin area and painless lesion on penis. Denies fever, chills, night sweats. States swelling at the right groin does not changes sizes. Denies increased bulging with coughing/valsalva maneuver. He is sexually active with one partner, no condom use. Denies penile discharge, itching/pain. Denies testicular swelling, pain. Denies urinary symptoms such as dysuria, urinary frequency, hematuria. He follows an ID doctor for HIV, where his last CD4 count within normal limits.       Past Medical History:  Diagnosis Date  . HIV disease (HCC)   . Hypertension   . Marijuana use     Patient Active Problem List   Diagnosis Date Noted  . Rash and nonspecific skin eruption 11/05/2016  . Chlamydia infection 11/05/2016  . Inguinal adenopathy 05/28/2015  . Testosterone deficiency 03/29/2015  . Vitamin D deficiency 03/29/2015  . Marijuana abuse 03/29/2015  . Tibial plateau fracture 03/28/2015  . Other chest pain 11/15/2014  . Closed fracture of unspecified phalanx or phalanges of hand 04/04/2014  . Tobacco abuse 02/22/2013  . Essential hypertension, benign 12/24/2009  . Human immunodeficiency virus (HIV) disease (HCC) 11/23/2006    Past Surgical History:  Procedure Laterality Date  . ORIF TIBIA PLATEAU Left 03/28/2015   Procedure: OPEN REDUCTION INTERNAL FIXATION (ORIF) LEFT TIBIAL PLATEAU FRACTURE;  Surgeon: Myrene GalasMichael Handy, MD;  [facility];  Service: Orthopedics;  Laterality: Left;       Home Medications    Prior to Admission medications   Medication Sig Start Date End Date Taking? Authorizing Provider  amLODipine (NORVASC) 5 MG tablet TAKE 1 TABLET BY MOUTH DAILY  12/04/16   Ginnie SmartHatcher, Jeffrey C, MD  aspirin EC 325 MG tablet Take 1 tablet (325 mg total) by mouth every 12 (twelve) hours. 03/29/15   Montez MoritaPaul, Keith, PA-C  cholecalciferol 1000 UNITS tablet Take 1 tablet (1,000 Units total) by mouth every 12 (twelve) hours. 03/29/15   Montez MoritaPaul, Keith, PA-C  diphenhydrAMINE (BENADRYL) 12.5 MG/5ML liquid Take 25 mg by mouth 4 (four) times daily as needed. Reported on 09/03/2015    [provider]  elvitegravir-cobicistat-emtricitabine-tenofovir (GENVOYA) 150-150-200-10 MG TABS tablet Take 1 tablet by mouth daily. 11/08/16   Gardiner Barefootomer, Robert W, MD    Family History Family History  Problem Relation Age of Onset  . Kidney disease Mother   . Kidney disease Father     Social History Social History  Substance Use Topics  . Smoking status: Current Every Day Smoker    Packs/day: 1.00    Years: 15.00    Types: Cigarettes  . Smokeless tobacco: Never Used     Comment: not smioking now due to cough  . Alcohol use 0.0 oz/week     Comment: rarely /vodka     Allergies   Patient has no known allergies.   Review of Systems Review of Systems   Physical Exam Triage Vital Signs ED Triage Vitals [05/24/17 1223]  Enc Vitals Group     BP (!) 154/78     Pulse Rate 78     Resp 18     Temp 98.6 F (37 C)     Temp Source Oral  SpO2 99 %     Weight      Height      Head Circumference      Peak Flow      Pain Score      Pain Loc      Pain Edu?      Excl. in GC?    No data found.   Updated Vital Signs BP (!) 154/78 (BP Location: Right Arm)   Pulse 78   Temp 98.6 F (37 C) (Oral)   Resp 18   SpO2 99%    Physical Exam  Constitutional: He is oriented to person, place, and time. He appears well-developed and well-nourished. No distress.  HENT:  Head: Normocephalic and atraumatic.  Eyes: Pupils are equal, round, and reactive to light. Conjunctivae are normal.  Genitourinary: Circumcised.     Genitourinary Comments: Isolated nonpainful ulceration  on penis.   Lymphadenopathy: Inguinal adenopathy noted on the right side.  Neurological: He is alert and oriented to person, place, and time.     UC Treatments / Results  Labs (all labs ordered are listed, but only abnormal results are displayed) Labs Reviewed  RPR  URINE CYTOLOGY ANCILLARY ONLY    EKG  EKG Interpretation None       Radiology No results found.  Procedures Procedures (including critical care time)  Medications Ordered in UC Medications  penicillin g benzathine (BICILLIN LA) 1200000 UNIT/2ML injection 2.4 Million Units (2.4 Million Units Intramuscular Given 05/24/17 1351)  cefTRIAXone (ROCEPHIN) injection 250 mg (250 mg Intramuscular Given 05/24/17 1351)  azithromycin (ZITHROMAX) tablet 1,000 mg (1,000 mg Oral Given 05/24/17 1351)     Initial Impression / Assessment and Plan / UC Course  I have reviewed the triage vital signs and the nursing notes.  Pertinent labs & imaging results that were available during my care of the patient were reviewed by me and considered in my medical decision making (see chart for details).    Patient was treated empirically for gonorrhea and Chlamydia. Treatment for syphilis given in office. Blood work and cytology sent, patient will be contacted with any positive results that require additional treatment. Patient to refrain from sexual activity for the next 7 days. Patient to follow up with infectious disease for further evaluation and treatment. Return precautions given.  Case discussed with Dr Tracie Harrier, who examined patient as well, agrees to plan.   Final Clinical Impressions(s) / UC Diagnoses   Final diagnoses:  Penile lesion  Inguinal adenopathy    New Prescriptions Discharge Medication List as of 05/24/2017  1:36 PM       Belinda Fisher, PA-C 05/24/17 2105

## 2017-05-24 NOTE — ED Triage Notes (Signed)
Pt  Reports   Swelling  r  Inguinal  Area      X  4  Days  Pt  Also  Has  A  Lesion on  His  Penis     He  Denies   Any  Discharge

## 2017-05-25 LAB — URINE CYTOLOGY ANCILLARY ONLY
Chlamydia: NEGATIVE
NEISSERIA GONORRHEA: NEGATIVE
Trichomonas: NEGATIVE

## 2017-05-25 LAB — RPR: RPR Ser Ql: REACTIVE — AB

## 2017-05-25 LAB — RPR, QUANT+TP ABS (REFLEX)
Rapid Plasma Reagin, Quant: 1:1 {titer} — ABNORMAL HIGH
T Pallidum Abs: POSITIVE — AB

## 2017-06-21 ENCOUNTER — Other Ambulatory Visit: Payer: Self-pay | Admitting: Internal Medicine

## 2017-07-12 NOTE — Telephone Encounter (Signed)
Verifying medications.

## 2017-07-13 ENCOUNTER — Telehealth: Payer: Self-pay

## 2017-07-13 ENCOUNTER — Encounter (HOSPITAL_COMMUNITY): Payer: Self-pay | Admitting: Emergency Medicine

## 2017-07-13 ENCOUNTER — Ambulatory Visit (HOSPITAL_COMMUNITY)
Admission: EM | Admit: 2017-07-13 | Discharge: 2017-07-13 | Disposition: A | Discharge status: Home / Self Care | Payer: Self-pay | Attending: Family Medicine | Admitting: Family Medicine

## 2017-07-13 DIAGNOSIS — H1031 Unspecified acute conjunctivitis, right eye: Secondary | ICD-10-CM

## 2017-07-13 MED ORDER — POLYMYXIN B-TRIMETHOPRIM 10000-0.1 UNIT/ML-% OP SOLN: 1.0000 [drp] | OPHTHALMIC | 0 refills | Status: DC

## 2017-07-13 NOTE — Telephone Encounter (Signed)
I called the pt to remind him of his appt with Dr. Luciana Axeomer 07/14/17. The pt stated that he was in the lobby waiting to see Dr. Luciana Axeomer or a nurse I told the pt that I would meet him in the lobby and see if I could help him. When I talked to the pt in the lobby he told me that he had pink eye and was hoping he could be seen. I told the pt that Dr. Luciana Axeomer was done with clinic and gone for the day and that it would be best to be seen at Urgent Care instead of waiting for his appt on 07/14/17. The pt stated he would wait for his appt even after my advice to go to Urgent Care.  Clifford Shields, [place]

## 2017-07-13 NOTE — ED Provider Notes (Signed)
[facility]    CSN: [id number] Arrival date & time: 07/13/17  1705     History   Chief Complaint Chief Complaint  Patient presents with  . Conjunctivitis    HPI Clifford Shields is a 31 y.o. male.   31 year-old male, presenting today complaining of right eye redness, irritation and drainage. Symptoms started yesterday. He has had some associated milky drainage from the affected eye. He does not wear contacts. Denies recent URI symptoms   The history is provided by the patient.  Conjunctivitis  This is a new problem. The current episode started yesterday. The problem occurs constantly. The problem has not changed since onset.Pertinent negatives include no chest pain, no abdominal pain, no headaches and no shortness of breath. Nothing aggravates the symptoms. Nothing relieves the symptoms. He has tried nothing for the symptoms. The treatment provided no relief.    Past Medical History:  Diagnosis Date  . HIV disease (HCC)   . Hypertension   . Marijuana use     Patient Active Problem List   Diagnosis Date Noted  . Rash and nonspecific skin eruption 11/05/2016  . Chlamydia infection 11/05/2016  . Inguinal adenopathy 05/28/2015  . Testosterone deficiency 03/29/2015  . Vitamin D deficiency 03/29/2015  . Marijuana abuse 03/29/2015  . Tibial plateau fracture 03/28/2015  . Other chest pain 11/15/2014  . Closed fracture of unspecified phalanx or phalanges of hand 04/04/2014  . Tobacco abuse 02/22/2013  . Essential hypertension, benign 12/24/2009  . Human immunodeficiency virus (HIV) disease (HCC) 11/23/2006    Past Surgical History:  Procedure Laterality Date  . ORIF TIBIA PLATEAU Left 03/28/2015   Procedure: OPEN REDUCTION INTERNAL FIXATION (ORIF) LEFT TIBIAL PLATEAU FRACTURE;  Surgeon: Myrene Galas, MD;  Location: [facility] OR;  Service: Orthopedics;  Laterality: Left;       Home Medications    Prior to Admission medications   Medication Sig Start Date End  Date Taking? Authorizing Provider  amLODipine (NORVASC) 5 MG tablet TAKE 1 TABLET BY MOUTH DAILY 12/04/16   Ginnie Smart, MD  aspirin EC 325 MG tablet Take 1 tablet (325 mg total) by mouth every 12 (twelve) hours. 03/29/15   [name], PA-C  cholecalciferol 1000 UNITS tablet Take 1 tablet (1,000 Units total) by mouth every 12 (twelve) hours. 03/29/15   [name], PA-C  diphenhydrAMINE (BENADRYL) 12.5 MG/5ML liquid Take 25 mg by mouth 4 (four) times daily as needed. Reported on 09/03/2015    [provider]  GENVOYA 150-150-200-10 MG TABS tablet TAKE 1 TABLET BY MOUTH DAILY 06/21/17   Comer, Belia Heman, MD  trimethoprim-polymyxin b (POLYTRIM) ophthalmic solution Place 1 drop into the right eye every 4 (four) hours. 07/13/17   Blue, Marylene Land, PA-C    Family History Family History  Problem Relation Age of Onset  . Kidney disease Mother   . Kidney disease Father     Social History Social History  Substance Use Topics  . Smoking status: Current Every Day Smoker    Packs/day: 1.00    Years: 15.00    Types: Cigarettes  . Smokeless tobacco: Never Used     Comment: not smioking now due to cough  . Alcohol use 0.0 oz/week     Comment: rarely /vodka     Allergies   Patient has no known allergies.   Review of Systems Review of Systems  Constitutional: Negative for chills and fever.  HENT: Negative for ear pain and sore throat.   Eyes: Positive for  discharge, redness and itching. Negative for pain and visual disturbance.  Respiratory: Negative for cough and shortness of breath.   Cardiovascular: Negative for chest pain and palpitations.  Gastrointestinal: Negative for abdominal pain and vomiting.  Genitourinary: Negative for dysuria and hematuria.  Musculoskeletal: Negative for arthralgias and back pain.  Skin: Negative for color change and rash.  Neurological: Negative for seizures, syncope and headaches.  All other systems reviewed and are negative.    Physical  Exam Triage Vital Signs ED Triage Vitals  Enc Vitals Group     BP 07/13/17 1714 (!) 176/102     Pulse Rate 07/13/17 1714 88     Resp 07/13/17 1714 18     Temp 07/13/17 1714 98.5 F (36.9 C)     Temp Source 07/13/17 1714 Oral     SpO2 07/13/17 1714 100 %     Weight --      Height --      Head Circumference --      Peak Flow --      Pain Score 07/13/17 1715 3     Pain Loc --      Pain Edu? --      Excl. in GC? --    No data found.   Updated Vital Signs BP (!) 176/102 (BP Location: Right Arm)   Pulse 88   Temp 98.5 F (36.9 C) (Oral)   Resp 18   SpO2 100%   Visual Acuity Right Eye Distance:   Left Eye Distance:   Bilateral Distance:    Right Eye Near:   Left Eye Near:    Bilateral Near:     Physical Exam  Constitutional: He appears well-developed and well-nourished.  HENT:  Head: Normocephalic and atraumatic.  Right Ear: Hearing, tympanic membrane, external ear and ear canal normal.  Left Ear: Hearing, tympanic membrane, external ear and ear canal normal.  Nose: Nose normal.  Mouth/Throat: Uvula is midline, oropharynx is clear and moist and mucous membranes are normal.  Eyes: Lids are normal. Right conjunctiva is injected. Right conjunctiva has no hemorrhage. Left conjunctiva is not injected. Left conjunctiva has no hemorrhage.  Neck: Neck supple.  Cardiovascular: Normal rate and regular rhythm.   No murmur heard. Pulmonary/Chest: Effort normal and breath sounds normal. No respiratory distress.  Abdominal: Soft. There is no tenderness.  Musculoskeletal: He exhibits no edema.  Neurological: He is alert.  Skin: Skin is warm and dry.  Psychiatric: He has a normal mood and affect.  Nursing note and vitals reviewed.    UC Treatments / Results  Labs (all labs ordered are listed, but only abnormal results are displayed) Labs Reviewed - No data to display  EKG  EKG Interpretation None       Radiology No results found.  Procedures Procedures  (including critical care time)  Medications Ordered in UC Medications - No data to display   Initial Impression / Assessment and Plan / UC Course  I have reviewed the triage vital signs and the nursing notes.  Pertinent labs & imaging results that were available during my care of the patient were reviewed by me and considered in my medical decision making (see chart for details).     Conjunctivitis - right eye. Will treat with polytrim  Final Clinical Impressions(s) / UC Diagnoses   Final diagnoses:  Acute conjunctivitis of right eye, unspecified acute conjunctivitis type    New Prescriptions New Prescriptions   TRIMETHOPRIM-POLYMYXIN B (POLYTRIM) OPHTHALMIC SOLUTION    Place 1 drop  into the right eye every 4 (four) hours.     Controlled Substance Prescriptions [place] Controlled Substance Registry consulted? Not Applicable   Alecia LemmingBlue, Olivia C, [place]PA-C 07/13/17 1729

## 2017-07-13 NOTE — Telephone Encounter (Signed)
4: 16 pm Patient walked into clinic . Walk in form states he has eye irritation  and requesting a school note.    When I went out to call patient he was gone.  5:00 pm   Laurell Josephsammy K Mikalia Fessel, RN

## 2017-07-13 NOTE — ED Triage Notes (Signed)
Right eye issues.  Red, swollen, painful, itching, tearing started yesterday.    Patient does not wear contacts

## 2017-07-14 ENCOUNTER — Ambulatory Visit: Payer: Self-pay | Admitting: Internal Medicine

## 2017-07-15 ENCOUNTER — Ambulatory Visit (INDEPENDENT_AMBULATORY_CARE_PROVIDER_SITE_OTHER): Payer: Self-pay | Admitting: Internal Medicine

## 2017-07-15 VITALS — BP 128/75 | HR 97 | Temp 99.0°F | Wt 219.0 lb

## 2017-07-15 DIAGNOSIS — B309 Viral conjunctivitis, unspecified: Secondary | ICD-10-CM

## 2017-07-15 DIAGNOSIS — R21 Rash and other nonspecific skin eruption: Secondary | ICD-10-CM

## 2017-07-15 DIAGNOSIS — B20 Human immunodeficiency virus [HIV] disease: Secondary | ICD-10-CM

## 2017-07-15 MED ORDER — VALACYCLOVIR HCL 1 G PO TABS: 1000.0000 mg | ORAL_TABLET | Freq: Two times a day (BID) | ORAL | 1 refills | Status: DC

## 2017-07-15 NOTE — Progress Notes (Signed)
re: pink eye and penile lesion  Patient ID: Clifford Shields, male   DOB: 05/03/1986, 31 y.o.   MRN: [id number]  HPI  Clifford ArnoldStacy is a 31yo M with well controlled hiv disease, CD 4 count of 900/VL<20 in feb, was treated for syphilis in the Fall due to RPR 1:1. But has not had further unprotected sex  In the recent week, he started to have increasing redness to his eyes, dry, itchy dx with pink eye through UC and also has 1 day of sore throat, cough. He noticed that the shaft of his penis has small tender lesions. 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 amLODipine (NORVASC) 5 MG tablet TAKE 1 TABLET BY MOUTH DAILY  . aspirin EC 325 MG tablet Take 1 tablet (325 mg total) by mouth every 12 (twelve) hours.  . GENVOYA 150-150-200-10 MG TABS tablet TAKE 1 TABLET BY MOUTH DAILY  . trimethoprim-polymyxin b (POLYTRIM) ophthalmic solution Place 1 drop into the right eye every 4 (four) hours.  . cholecalciferol 1000 UNITS tablet Take 1 tablet (1,000 Units total) by mouth every 12 (twelve) hours. (Patient not taking: Reported on 07/15/2017)  . diphenhydrAMINE (BENADRYL) 12.5 MG/5ML liquid Take 25 mg by mouth 4 (four) times daily as needed. Reported on 09/03/2015   No facility-administered encounter medications on file as of 07/15/2017.      Patient Active Problem List   Diagnosis Date Noted  . Rash and nonspecific skin eruption 11/05/2016  . Chlamydia infection 11/05/2016  . Inguinal adenopathy 05/28/2015  . Testosterone deficiency 03/29/2015  . Vitamin D deficiency 03/29/2015  . Marijuana abuse 03/29/2015  . Tibial plateau fracture 03/28/2015  . Other chest pain 11/15/2014  . Closed fracture of unspecified phalanx or phalanges of hand 04/04/2014  . Tobacco abuse 02/22/2013  . Essential  hypertension, benign 12/24/2009  . Human immunodeficiency virus (HIV) disease (HCC) 11/23/2006     Health Maintenance Due  Topic Date Due  . TETANUS/TDAP  08/22/2006  . INFLUENZA VACCINE  04/14/2017     Review of Systems Review of Systems  Constitutional: Negative for fever, chills, diaphoresis, activity change, appetite change, fatigue and unexpected weight change.  HENT: Negative for congestion, sore throat, rhinorrhea, sneezing, trouble swallowing and sinus pressure.  Eyes: Negative for photophobia and visual disturbance.  Respiratory: Negative for cough, chest tightness, shortness of breath, wheezing and stridor.  Cardiovascular: Negative for chest pain, palpitations and leg swelling.  Gastrointestinal: Negative for nausea, vomiting, abdominal pain, diarrhea, constipation, blood in stool, abdominal distention and anal bleeding.  Genitourinary: Negative for dysuria, hematuria, flank pain and difficulty urinating.  Musculoskeletal: Negative for myalgias, back pain, joint swelling, arthralgias and gait problem.  Skin: Negative for color change, pallor, rash and wound.  Neurological: Negative for dizziness, tremors, weakness and light-headedness.  Hematological: Negative for adenopathy. Does not bruise/bleed easily.  Psychiatric/Behavioral: Negative for behavioral problems, confusion, sleep disturbance, dysphoric mood, decreased concentration and agitation.    Physical Exam   BP 128/75   Pulse 97   Temp 99 F (37.2 C) (Oral)   Wt 219 lb (99.3 kg)   BMI 31.42 kg/m   Physical Exam  Constitutional: He is oriented to person, place, and time. He appears well-developed and well-nourished. No distress.  HENT:  Mouth/Throat: Oropharynx is mildly erythematous  Cardiovascular: Normal rate, regular rhythm and normal heart sounds. Exam reveals no gallop and no friction rub.  No murmur heard.  Pulmonary/Chest: Effort normal and breath sounds normal. No respiratory distress. He has no  wheezes.  ZO:[id number] cluster of flat lesions on shaft of penis no redness to the base   Lab Results  Component Value Date   CD4TCELL 32 (L) 11/05/2016   Lab Results  Component Value Date   CD4TABS 900 11/05/2016   CD4TABS 710 09/03/2015   CD4TABS 610 05/28/2015   Lab Results  Component Value Date   HIV1RNAQUANT 63 (H) 09/03/2015   Lab Results  Component Value Date   HEPBSAB EQUIVOCAL (A) 02/07/2013   Lab Results  Component Value Date   LABRPR Reactive (A) 05/24/2017    CBC Lab Results  Component Value Date   WBC 10.0 05/30/2015   RBC 3.94 (L) 05/30/2015   HGB 10.7 (L) 05/30/2015   HCT 33.4 (L) 05/30/2015   PLT 209 05/30/2015   MCV 84.8 05/30/2015   MCH 27.2 05/30/2015   MCHC 32.0 05/30/2015   RDW 13.8 05/30/2015   LYMPHSABS 1.7 05/28/2015   MONOABS 0.6 05/28/2015   EOSABS 0.2 05/28/2015    BMET Lab Results  Component Value Date   NA 142 11/05/2016   K 4.2 11/05/2016   CL 109 11/05/2016   CO2 27 11/05/2016   GLUCOSE 93 11/05/2016   BUN 9 11/05/2016   CREATININE 1.14 11/05/2016   CALCIUM 9.1 11/05/2016   GFRNONAA 86 11/05/2016   GFRAA >89 11/05/2016      Assessment and Plan  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 The patient states they are slightly tender. He had the lesions in the fall but now back, suspect that this is a recurrence. 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 Will check in with him next week  Viral conjunctivities - continue on eye drops for viral conjunctivitis - give note for work x 1 day  hiv disease = continue with current regimen

## 2017-07-16 ENCOUNTER — Telehealth: Payer: Self-pay

## 2017-07-16 NOTE — Telephone Encounter (Signed)
Called Clifford Shields to schedule a follow up with Dr. Drue SecondSnider, however, Dr. Drue SecondSnider stated that she would check up with Clifford Shields via phone call since he is unable to come to the office within a week. I left a voicemail informing him that Dr. Drue SecondSnider would follow up within the upcoming week.  Lorenso CourierJose L Mackinsey Pelland, [place]MA

## 2017-07-31 ENCOUNTER — Other Ambulatory Visit: Payer: Self-pay | Admitting: Infectious Diseases

## 2017-07-31 ENCOUNTER — Other Ambulatory Visit: Payer: Self-pay | Admitting: Internal Medicine

## 2017-07-31 DIAGNOSIS — I10 Essential (primary) hypertension: Secondary | ICD-10-CM

## 2017-07-31 DIAGNOSIS — B20 Human immunodeficiency virus [HIV] disease: Secondary | ICD-10-CM

## 2017-08-03 ENCOUNTER — Encounter: Payer: Self-pay | Admitting: Internal Medicine

## 2017-10-13 ENCOUNTER — Ambulatory Visit (INDEPENDENT_AMBULATORY_CARE_PROVIDER_SITE_OTHER): Payer: Self-pay | Admitting: Pharmacist

## 2017-10-13 DIAGNOSIS — I10 Essential (primary) hypertension: Secondary | ICD-10-CM

## 2017-10-13 DIAGNOSIS — Z9119 Patient's noncompliance with other medical treatment and regimen: Secondary | ICD-10-CM

## 2017-10-13 DIAGNOSIS — B20 Human immunodeficiency virus [HIV] disease: Secondary | ICD-10-CM

## 2017-10-13 DIAGNOSIS — Z91199 Patient's noncompliance with other medical treatment and regimen due to unspecified reason: Secondary | ICD-10-CM

## 2017-10-13 MED ORDER — LISINOPRIL 10 MG PO TABS: 10.0000 mg | ORAL_TABLET | Freq: Every day | ORAL | 11 refills | Status: DC

## 2017-10-13 NOTE — Progress Notes (Signed)
[facility] Visit  HPI: Clifford Shields is a 32 y.o. male who presents to the [facility] clinic today to renew his HMAP.  He asked to speak to someone about running out of a medication.  Patient Active Problem List   Diagnosis Date Noted  . Rash and nonspecific skin eruption 11/05/2016  . Chlamydia infection 11/05/2016  . Inguinal adenopathy 05/28/2015  . Testosterone deficiency 03/29/2015  . Vitamin D deficiency 03/29/2015  . Marijuana abuse 03/29/2015  . Tibial plateau fracture 03/28/2015  . Other chest pain 11/15/2014  . Closed fracture of unspecified phalanx or phalanges of hand 04/04/2014  . Tobacco abuse 02/22/2013  . Essential hypertension, benign 12/24/2009  . Human immunodeficiency virus (HIV) disease (HCC) 11/23/2006    Patient's Medications  New Prescriptions   LISINOPRIL (ZESTRIL) 10 MG TABLET    Take 1 tablet (10 mg total) by mouth daily.  Previous Medications   ASPIRIN EC 325 MG TABLET    Take 1 tablet (325 mg total) by mouth every 12 (twelve) hours.   CHOLECALCIFEROL 1000 UNITS TABLET    Take 1 tablet (1,000 Units total) by mouth every 12 (twelve) hours.   DIPHENHYDRAMINE (BENADRYL) 12.5 MG/5ML LIQUID    Take 25 mg by mouth 4 (four) times daily as needed. Reported on 09/03/2015   GENVOYA 150-150-200-10 MG TABS TABLET    TAKE 1 TABLET BY MOUTH DAILY   TRIMETHOPRIM-POLYMYXIN B (POLYTRIM) OPHTHALMIC SOLUTION    Place 1 drop into the right eye every 4 (four) hours.   VALACYCLOVIR (VALTREX) 1000 MG TABLET    Take 1 tablet (1,000 mg total) by mouth 2 (two) times daily.  Modified Medications   No medications on file  Discontinued Medications   AMLODIPINE (NORVASC) 5 MG TABLET    TAKE 1 TABLET BY MOUTH DAILY    Allergies: No Known Allergies  Past Medical History: Past Medical History:  Diagnosis Date  . HIV disease (HCC)   . Hypertension   . Marijuana use     Social History: Social History   Socioeconomic History  . Marital  status: Single    Spouse name: Not on file  . Number of children: Not on file  . Years of education: Not on file  . Highest education level: Not on file  Social Needs  . Financial resource strain: Not on file  . Food insecurity - worry: Not on file  . Food insecurity - inability: Not on file  . Transportation needs - medical: Not on file  . Transportation needs - non-medical: Not on file  Occupational History  . Not on file  Tobacco Use  . Smoking status: Current Every Day Smoker    Packs/day: 1.00    Years: 15.00    Pack years: 15.00    Types: Cigarettes  . Smokeless tobacco: Never Used  . Tobacco comment: not smioking now due to cough  Substance and Sexual Activity  . Alcohol use: Yes    Alcohol/week: 0.0 oz    Comment: rarely /vodka  . Drug use: Yes    Frequency: 14.0 times per week    Types: Marijuana  . Sexual activity: Yes    Partners: Male    Birth control/protection: Condom    Comment: given condoms  Other Topics Concern  . Not on file  Social History Narrative  . Not on file    Labs: HIV 1 RNA Quant (copies/mL)  Date Value  09/03/2015 63 (H)  05/28/2015 <20  03/28/2015 110  CD4 T Cell Abs (/uL)  Date Value  11/05/2016 900  09/03/2015 710  05/28/2015 610   Hep B S Ab (no units)  Date Value  02/07/2013 EQUIVOCAL (A)   Hepatitis B Surface Ag (no units)  Date Value  03/28/2015 Negative   HCV Ab (s/co ratio)  Date Value  03/28/2015 <0.1    Lipids:    Component Value Date/Time   CHOL 141 10/04/2014 1514   TRIG 173 (H) 10/04/2014 1514   HDL 32 (L) 10/04/2014 1514   CHOLHDL 4.4 10/04/2014 1514   VLDL 35 10/04/2014 1514   LDLCALC 74 10/04/2014 1514    Current HIV Regimen: Genvoya  Assessment: Clifford Shields is here today to renew her HMAP with Clifford Shields but wanted to speak to someone about running out of a medication.  At first I thought it was Genvoya, but she has that and there are no lapses in his HMAP, so he is good there.  He does admit to  missing a lot of doses of the Genvoya attributed to "just falling off the wagon". He said he never spaced it out, but that he took it for a week or two then stopped but started back on it last month.  I told him to make sure he doesn't do that anymore.  He was in a hurry and did not want labs today, but I will get them when he comes back in early March.  The medication he was referring to was his amlodipine.  He said he had a rash on his legs that occurred when he was taking it and that his mom was having the same reactions, so he stopped it and the rash is much better and going away.  He is out now and wants something different.  He says his BP runs in the 180s/100s sometimes. It looks like he hovers in the 140s-150s systolic most of the time. He states he was on atenolol in the past too.  I will change him to lisinopril today and have him come back in ~4 weeks to titrate up/add another medication until we can get him under control.  He made a f/u visit with Clifford Shields for end of March.  Plan: - Continue Genvoya and don't miss any more doses - Lisinopril 10 mg PO daily - Labs next visit - F/u with me 3/4 at 1130am - F/u with Dr. Luciana Shields 3/21 at 11:15am  Clifford Shields, PharmD, AAHIVP, CPP Infectious Diseases Clinical Pharmacist [facility] 10/13/2017, 12:01 PM

## 2017-11-15 ENCOUNTER — Ambulatory Visit: Payer: Self-pay

## 2017-12-02 ENCOUNTER — Ambulatory Visit: Payer: Self-pay | Admitting: Internal Medicine

## 2017-12-16 ENCOUNTER — Ambulatory Visit (HOSPITAL_COMMUNITY)
Admission: EM | Admit: 2017-12-16 | Discharge: 2017-12-16 | Disposition: A | Discharge status: Home / Self Care | Payer: Self-pay | Attending: Nurse Practitioner | Admitting: Nurse Practitioner

## 2017-12-16 ENCOUNTER — Ambulatory Visit (INDEPENDENT_AMBULATORY_CARE_PROVIDER_SITE_OTHER): Payer: Self-pay

## 2017-12-16 ENCOUNTER — Encounter (HOSPITAL_COMMUNITY): Payer: Self-pay | Admitting: Emergency Medicine

## 2017-12-16 ENCOUNTER — Other Ambulatory Visit: Payer: Self-pay

## 2017-12-16 DIAGNOSIS — R059 Cough, unspecified: Secondary | ICD-10-CM

## 2017-12-16 DIAGNOSIS — R05 Cough: Secondary | ICD-10-CM

## 2017-12-16 MED ORDER — ALBUTEROL SULFATE HFA 108 (90 BASE) MCG/ACT IN AERS: 1.0000 | INHALATION_SPRAY | Freq: Four times a day (QID) | RESPIRATORY_TRACT | 0 refills | Status: DC | PRN

## 2017-12-16 NOTE — Discharge Instructions (Signed)
Over-the-counter treatment of choice for cough. Use the inhaler as needed for SOB or wheezing. Follow-up with your primary care provider or report to the ED if shortness of breath worsens, blood in sputum, change in character of cough, development of fever or chills, inability to maintain nutrition and hydration.

## 2017-12-16 NOTE — ED Triage Notes (Addendum)
Reports 3 month history of cough and sob, random bloody nose. Runny nose, itchy throat and particularly bad at night Patient says lawyer directed him to urgent care.

## 2017-12-16 NOTE — ED Provider Notes (Signed)
[facility]    CSN: [id number] Arrival date & time: 12/16/17  1155     History   Chief Complaint Chief Complaint  Patient presents with  . Cough    HPI Clifford Shields is a 32 y.o. male.   Subjective:   Clifford Shields is a 32 y.o. male with history of hypertension and HIV that presents for evaluation of a cough.  The cough is waxing and waning over time. Onset of symptoms was several months ago and has been progressively worsening since that time.  Associated symptoms include night sweats, shortness of breath, sputum production and wheezing. He denies any fevers, chills, hemoptysis, nausea, vomiting or any other URI type symptoms.   Notably, the patient reports that there has been a hole in his bedroom wall for the past 7 months and the cough developed a couple of months afterwards. The cough seems to be worse at night when he is in his apartment. The cough is better during the day when he is not home and when he spends the night elsewhere.   Patient does not have a history of asthma. Patient has not had recent travel. Patient does have a history of smoking. Patient has not had a previous chest x-ray.   The following portions of the patient's history were reviewed and updated as appropriate: allergies, current medications, past family history, past medical history, past social history, past surgical history and problem list.       Past Medical History:  Diagnosis Date  . HIV disease (HCC)   . Hypertension   . Marijuana use     Patient Active Problem List   Diagnosis Date Noted  . Rash and nonspecific skin eruption 11/05/2016  . Chlamydia infection 11/05/2016  . Inguinal adenopathy 05/28/2015  . Testosterone deficiency 03/29/2015  . Vitamin D deficiency 03/29/2015  . Marijuana abuse 03/29/2015  . Tibial plateau fracture 03/28/2015  . Other chest pain 11/15/2014  . Closed fracture of unspecified phalanx or phalanges of hand 04/04/2014  . Tobacco abuse  02/22/2013  . Essential hypertension, benign 12/24/2009  . Human immunodeficiency virus (HIV) disease (HCC) 11/23/2006    Past Surgical History:  Procedure Laterality Date  . ORIF TIBIA PLATEAU Left 03/28/2015   Procedure: OPEN REDUCTION INTERNAL FIXATION (ORIF) LEFT TIBIAL PLATEAU FRACTURE;  Surgeon: Myrene Galas, MD;  Location: [facility] OR;  Service: Orthopedics;  Laterality: Left;       Home Medications    Prior to Admission medications   Medication Sig Start Date End Date Taking? Authorizing Provider  GENVOYA 150-150-200-10 MG TABS tablet TAKE 1 TABLET BY MOUTH DAILY 08/02/17  Yes Judyann Munson, MD  lisinopril (ZESTRIL) 10 MG tablet Take 1 tablet (10 mg total) by mouth daily. 10/13/17  Yes Comer, Belia Heman, MD  aspirin EC 325 MG tablet Take 1 tablet (325 mg total) by mouth every 12 (twelve) hours. 03/29/15   [name], PA-C  cholecalciferol 1000 UNITS tablet Take 1 tablet (1,000 Units total) by mouth every 12 (twelve) hours. Patient not taking: Reported on 07/15/2017 03/29/15   [name], PA-C  diphenhydrAMINE (BENADRYL) 12.5 MG/5ML liquid Take 25 mg by mouth 4 (four) times daily as needed. Reported on 09/03/2015    [provider]  trimethoprim-polymyxin b (POLYTRIM) ophthalmic solution Place 1 drop into the right eye every 4 (four) hours. 07/13/17   Blue, Olivia C, PA-C  valACYclovir (VALTREX) 1000 MG tablet Take 1 tablet (1,000 mg total) by mouth 2 (two) times daily. 07/15/17   Drue Second,  Aram Beecham, MD    Family History Family History  Problem Relation Age of Onset  . Kidney disease Mother   . Kidney disease Father     Social History Social History   Tobacco Use  . Smoking status: Current Every Day Smoker    Packs/day: 1.00    Years: 15.00    Pack years: 15.00    Types: Cigarettes  . Smokeless tobacco: Never Used  . Tobacco comment: not smioking now due to cough  Substance Use Topics  . Alcohol use: Yes    Alcohol/week: 0.0 oz    Comment: rarely /vodka  . Drug  use: Yes    Frequency: 14.0 times per week    Types: Marijuana     Allergies   Patient has no known allergies.   Review of Systems Review of Systems  Constitutional: Positive for diaphoresis. Negative for fever.  HENT: Negative.   Eyes: Negative.   Respiratory: Positive for cough, shortness of breath and wheezing.   Cardiovascular: Negative for chest pain, palpitations and leg swelling.  All other systems reviewed and are negative.    Physical Exam Triage Vital Signs ED Triage Vitals  Enc Vitals Group     BP 12/16/17 1230 (!) 155/103     Pulse Rate 12/16/17 1230 83     Resp 12/16/17 1230 20     Temp 12/16/17 1230 98.2 F (36.8 C)     Temp Source 12/16/17 1230 Oral     SpO2 12/16/17 1230 99 %     Weight --      Height --      Head Circumference --      Peak Flow --      Pain Score 12/16/17 1227 0     Pain Loc --      Pain Edu? --      Excl. in GC? --    No data found.  Updated Vital Signs BP (!) 155/103 (BP Location: Left Arm)   Pulse 83   Temp 98.2 F (36.8 C) (Oral)   Resp 20   SpO2 99%   Visual Acuity Right Eye Distance:   Left Eye Distance:   Bilateral Distance:    Right Eye Near:   Left Eye Near:    Bilateral Near:     Physical Exam  Constitutional: He is oriented to person, place, and time. He appears well-developed and well-nourished.  Neck: Normal range of motion. Neck supple.  Cardiovascular: Normal rate, regular rhythm and normal heart sounds.  Pulmonary/Chest: Effort normal and breath sounds normal.  Musculoskeletal: Normal range of motion.  Neurological: He is alert and oriented to person, place, and time.  Skin: Skin is warm and dry.  Psychiatric: He has a normal mood and affect.     UC Treatments / Results  Labs (all labs ordered are listed, but only abnormal results are displayed) Labs Reviewed - No data to display  EKG None Radiology Dg Chest 2 View  Result Date: 12/16/2017 CLINICAL DATA:  Cough.  Night sweats.  Smoker.  EXAM: CHEST - 2 VIEW COMPARISON:  05/28/2015. FINDINGS: Mediastinum and hilar structures normal. Lungs are clear. No pleural effusion or pneumothorax. Heart size normal. No acute bony abnormality. IMPRESSION: No acute cardiopulmonary disease. Electronically Signed   By: Maisie Fus  Register   On: 12/16/2017 12:49    Procedures Procedures (including critical care time)  Medications Ordered in UC Medications - No data to display   Initial Impression / Assessment and Plan / UC Course  I have reviewed the triage vital signs and the nursing notes.  Pertinent labs & imaging results that were available during my care of the patient were reviewed by me and considered in my medical decision making (see chart for details).     32 year old male with history of hypertension and HIV presents with a four-month history of cough, night sweats, shortness of breath, sputum production and wheezing. CXR negative for any acute cardiopulmonary disease. Physical exam without any significant findings. Advised to avoid exposure to tobacco smoke and fumes. B-agonist inhaler as needed. Smoking cessation discussed. Patient advised to follow-up with PCP or report to ED if shortness of breath worsens, blood in sputum, change in character of cough, development of fever or chills, inability to maintain nutrition and hydration. Discussed diagnosis and treatment with patient. All questions have been answered and all concerns have been addressed. The patient verbalized understanding and had no further questions   Final Clinical Impressions(s) / UC Diagnoses   Final diagnoses:  Cough    ED Discharge Orders    None       Controlled Substance Prescriptions [place] Controlled Substance Registry consulted? Not Applicable   Lurline IdolMurrill, Kathalene Sporer, FNP 12/16/17 1318

## 2018-03-10 ENCOUNTER — Other Ambulatory Visit: Payer: Self-pay | Admitting: *Deleted

## 2018-03-10 ENCOUNTER — Ambulatory Visit: Payer: Self-pay

## 2018-03-10 DIAGNOSIS — B20 Human immunodeficiency virus [HIV] disease: Secondary | ICD-10-CM

## 2018-03-10 MED ORDER — ELVITEG-COBIC-EMTRICIT-TENOFAF 150-150-200-10 MG PO TABS: 1.0000 | ORAL_TABLET | Freq: Every day | ORAL | 3 refills | Status: DC

## 2018-03-15 ENCOUNTER — Other Ambulatory Visit: Payer: Self-pay | Admitting: Behavioral Health

## 2018-03-15 DIAGNOSIS — B20 Human immunodeficiency virus [HIV] disease: Secondary | ICD-10-CM

## 2018-03-15 MED ORDER — ELVITEG-COBIC-EMTRICIT-TENOFAF 150-150-200-10 MG PO TABS: 1.0000 | ORAL_TABLET | Freq: Every day | ORAL | 2 refills | Status: DC

## 2018-03-15 NOTE — Progress Notes (Signed)
[facility] assistance [facility] Zola ButtonAshley Hill RN

## 2018-03-16 ENCOUNTER — Encounter: Payer: Self-pay | Admitting: Internal Medicine

## 2018-04-18 ENCOUNTER — Other Ambulatory Visit: Payer: Self-pay | Admitting: *Deleted

## 2018-04-18 ENCOUNTER — Other Ambulatory Visit: Payer: Self-pay

## 2018-04-18 DIAGNOSIS — Z113 Encounter for screening for infections with a predominantly sexual mode of transmission: Secondary | ICD-10-CM

## 2018-04-18 DIAGNOSIS — B20 Human immunodeficiency virus [HIV] disease: Secondary | ICD-10-CM

## 2018-04-27 ENCOUNTER — Other Ambulatory Visit: Payer: Self-pay

## 2018-04-27 DIAGNOSIS — Z113 Encounter for screening for infections with a predominantly sexual mode of transmission: Secondary | ICD-10-CM

## 2018-04-27 DIAGNOSIS — B20 Human immunodeficiency virus [HIV] disease: Secondary | ICD-10-CM

## 2018-04-28 LAB — T-HELPER CELL (CD4) - (RCID CLINIC ONLY)
CD4 % Helper T Cell: 28 % — ABNORMAL LOW (ref 33–55)
CD4 T CELL ABS: 760 /uL (ref 400–2700)

## 2018-04-29 LAB — COMPLETE METABOLIC PANEL WITH GFR
AG RATIO: 1.4 (calc) (ref 1.0–2.5)
ALBUMIN MSPROF: 4.5 g/dL (ref 3.6–5.1)
ALT: 24 U/L (ref 9–46)
AST: 23 U/L (ref 10–40)
Alkaline phosphatase (APISO): 72 U/L (ref 40–115)
BUN: 11 mg/dL (ref 7–25)
CALCIUM: 9.2 mg/dL (ref 8.6–10.3)
CO2: 27 mmol/L (ref 20–32)
Chloride: 106 mmol/L (ref 98–110)
Creat: 1.2 mg/dL (ref 0.60–1.35)
GFR, EST NON AFRICAN AMERICAN: 81 mL/min/{1.73_m2} (ref >=60)
GFR, Est African American: 93 mL/min/{1.73_m2} (ref >=60)
GLOBULIN: 3.3 g/dL (ref 1.9–3.7)
Glucose, Bld: 132 mg/dL — ABNORMAL HIGH (ref 65–99)
POTASSIUM: 3.7 mmol/L (ref 3.5–5.3)
Sodium: 143 mmol/L (ref 135–146)
Total Bilirubin: 0.4 mg/dL (ref 0.2–1.2)
Total Protein: 7.8 g/dL (ref 6.1–8.1)

## 2018-04-29 LAB — CBC WITH DIFFERENTIAL/PLATELET
BASOS ABS: 9 {cells}/uL (ref 0–200)
BASOS PCT: 0.2 %
EOS ABS: 248 {cells}/uL (ref 15–500)
Eosinophils Relative: 5.4 %
HCT: 37.9 % — ABNORMAL LOW (ref 38.5–50.0)
Hemoglobin: 12.4 g/dL — ABNORMAL LOW (ref 13.2–17.1)
Lymphs Abs: 2700 cells/uL (ref 850–3900)
MCH: 26.6 pg — AB (ref 27.0–33.0)
MCHC: 32.7 g/dL (ref 32.0–36.0)
MCV: 81.3 fL (ref 80.0–100.0)
MONOS PCT: 4.5 %
MPV: 11.9 fL (ref 7.5–12.5)
Neutro Abs: 1435 cells/uL — ABNORMAL LOW (ref 1500–7800)
Neutrophils Relative %: 31.2 %
PLATELETS: 190 10*3/uL (ref 140–400)
RBC: 4.66 10*6/uL (ref 4.20–5.80)
RDW: 13.5 % (ref 11.0–15.0)
Total Lymphocyte: 58.7 %
WBC mixed population: 207 cells/uL (ref 200–950)
WBC: 4.6 10*3/uL (ref 3.8–10.8)

## 2018-04-29 LAB — RPR: RPR: NONREACTIVE

## 2018-04-29 LAB — URINE CYTOLOGY ANCILLARY ONLY
CHLAMYDIA, DNA PROBE: NEGATIVE
Neisseria Gonorrhea: NEGATIVE

## 2018-04-29 LAB — HIV-1 RNA QUANT-NO REFLEX-BLD
HIV 1 RNA QUANT: DETECTED {copies}/mL — AB
HIV-1 RNA QUANT, LOG: DETECTED {Log_copies}/mL — AB

## 2018-05-03 ENCOUNTER — Encounter: Payer: Self-pay | Admitting: Internal Medicine

## 2018-05-05 ENCOUNTER — Other Ambulatory Visit: Payer: Self-pay | Admitting: Internal Medicine

## 2018-05-05 DIAGNOSIS — B20 Human immunodeficiency virus [HIV] disease: Secondary | ICD-10-CM

## 2018-05-17 ENCOUNTER — Encounter: Payer: Self-pay | Admitting: Internal Medicine

## 2018-10-05 ENCOUNTER — Emergency Department (HOSPITAL_COMMUNITY)
Admission: EM | Admit: 2018-10-05 | Discharge: 2018-10-05 | Disposition: A | Discharge status: Home / Self Care | Payer: Self-pay | Attending: Emergency Medicine | Admitting: Emergency Medicine

## 2018-10-05 ENCOUNTER — Other Ambulatory Visit: Payer: Self-pay

## 2018-10-05 ENCOUNTER — Encounter (HOSPITAL_COMMUNITY): Payer: Self-pay | Admitting: Emergency Medicine

## 2018-10-05 ENCOUNTER — Emergency Department (HOSPITAL_COMMUNITY): Payer: Self-pay

## 2018-10-05 DIAGNOSIS — B20 Human immunodeficiency virus [HIV] disease: Secondary | ICD-10-CM | POA: Insufficient documentation

## 2018-10-05 DIAGNOSIS — Z7982 Long term (current) use of aspirin: Secondary | ICD-10-CM | POA: Insufficient documentation

## 2018-10-05 DIAGNOSIS — F17210 Nicotine dependence, cigarettes, uncomplicated: Secondary | ICD-10-CM | POA: Insufficient documentation

## 2018-10-05 DIAGNOSIS — Z79899 Other long term (current) drug therapy: Secondary | ICD-10-CM | POA: Insufficient documentation

## 2018-10-05 DIAGNOSIS — I10 Essential (primary) hypertension: Secondary | ICD-10-CM | POA: Insufficient documentation

## 2018-10-05 DIAGNOSIS — R05 Cough: Secondary | ICD-10-CM | POA: Insufficient documentation

## 2018-10-05 DIAGNOSIS — J101 Influenza due to other identified influenza virus with other respiratory manifestations: Secondary | ICD-10-CM | POA: Insufficient documentation

## 2018-10-05 LAB — URINALYSIS, ROUTINE W REFLEX MICROSCOPIC
BACTERIA UA: NONE SEEN
BILIRUBIN URINE: NEGATIVE
Glucose, UA: NEGATIVE mg/dL
HGB URINE DIPSTICK: NEGATIVE
Ketones, ur: 5 mg/dL — AB
LEUKOCYTES UA: NEGATIVE
Nitrite: NEGATIVE
PROTEIN: 30 mg/dL — AB
Specific Gravity, Urine: 1.032 — ABNORMAL HIGH (ref 1.005–1.030)
pH: 6 (ref 5.0–8.0)

## 2018-10-05 LAB — CBC WITH DIFFERENTIAL/PLATELET
Abs Immature Granulocytes: 0.01 10*3/uL (ref 0.00–0.07)
Basophils Absolute: 0 10*3/uL (ref 0.0–0.1)
Basophils Relative: 0 %
Eosinophils Absolute: 0 10*3/uL (ref 0.0–0.5)
Eosinophils Relative: 1 %
HEMATOCRIT: 39.9 % (ref 39.0–52.0)
HEMOGLOBIN: 12.6 g/dL — AB (ref 13.0–17.0)
IMMATURE GRANULOCYTES: 0 %
LYMPHS PCT: 30 %
Lymphs Abs: 2 10*3/uL (ref 0.7–4.0)
MCH: 27 pg (ref 26.0–34.0)
MCHC: 31.6 g/dL (ref 30.0–36.0)
MCV: 85.4 fL (ref 80.0–100.0)
Monocytes Absolute: 0.8 10*3/uL (ref 0.1–1.0)
Monocytes Relative: 12 %
NEUTROS PCT: 57 %
NRBC: 0 % (ref 0.0–0.2)
Neutro Abs: 3.8 10*3/uL (ref 1.7–7.7)
Platelets: 177 10*3/uL (ref 150–400)
RBC: 4.67 MIL/uL (ref 4.22–5.81)
RDW: 13.5 % (ref 11.5–15.5)
WBC: 6.7 10*3/uL (ref 4.0–10.5)

## 2018-10-05 LAB — COMPREHENSIVE METABOLIC PANEL
ALT: 18 U/L (ref 0–44)
ANION GAP: 11 (ref 5–15)
AST: 26 U/L (ref 15–41)
Albumin: 4.1 g/dL (ref 3.5–5.0)
Alkaline Phosphatase: 53 U/L (ref 38–126)
BUN: 6 mg/dL (ref 6–20)
CHLORIDE: 103 mmol/L (ref 98–111)
CO2: 24 mmol/L (ref 22–32)
Calcium: 9.2 mg/dL (ref 8.9–10.3)
Creatinine, Ser: 1.38 mg/dL — ABNORMAL HIGH (ref 0.61–1.24)
GFR calc non Af Amer: >60 mL/min (ref >60)
Glucose, Bld: 93 mg/dL (ref 70–99)
Potassium: 3.8 mmol/L (ref 3.5–5.1)
SODIUM: 138 mmol/L (ref 135–145)
Total Bilirubin: 0.3 mg/dL (ref 0.3–1.2)
Total Protein: 7.7 g/dL (ref 6.5–8.1)

## 2018-10-05 LAB — INFLUENZA PANEL BY PCR (TYPE A & B)
INFLAPCR: POSITIVE — AB
Influenza B By PCR: NEGATIVE

## 2018-10-05 LAB — LACTIC ACID, PLASMA
LACTIC ACID, VENOUS: 1.8 mmol/L (ref 0.5–1.9)
Lactic Acid, Venous: 2.2 mmol/L (ref 0.5–1.9)

## 2018-10-05 MED ORDER — SODIUM CHLORIDE 0.9 % IV BOLUS
1000.0000 mL | Freq: Once | INTRAVENOUS | Status: AC
  Administered 2018-10-05: 1000 mL via INTRAVENOUS

## 2018-10-05 MED ORDER — ACETAMINOPHEN 325 MG PO TABS
650.0000 mg | ORAL_TABLET | Freq: Once | ORAL | Status: AC
  Administered 2018-10-05: 650 mg via ORAL
  Filled 2018-10-05: qty 2

## 2018-10-05 MED ORDER — ONDANSETRON 4 MG PO TBDP: 4.0000 mg | ORAL_TABLET | Freq: Three times a day (TID) | ORAL | 0 refills | Status: DC | PRN

## 2018-10-05 MED ORDER — IBUPROFEN 800 MG PO TABS
800.0000 mg | ORAL_TABLET | Freq: Once | ORAL | Status: AC
  Administered 2018-10-05: 800 mg via ORAL
  Filled 2018-10-05: qty 1

## 2018-10-05 MED ORDER — OSELTAMIVIR PHOSPHATE 75 MG PO CAPS: 75.0000 mg | ORAL_CAPSULE | Freq: Two times a day (BID) | ORAL | 0 refills | Status: DC

## 2018-10-05 MED ORDER — OSELTAMIVIR PHOSPHATE 75 MG PO CAPS
75.0000 mg | ORAL_CAPSULE | Freq: Once | ORAL | Status: AC
  Administered 2018-10-05: 75 mg via ORAL
  Filled 2018-10-05: qty 1

## 2018-10-05 MED ORDER — SODIUM CHLORIDE 0.9% FLUSH
3.0000 mL | Freq: Once | INTRAVENOUS | Status: AC
  Administered 2018-10-05: 3 mL via INTRAVENOUS

## 2018-10-05 MED ORDER — ONDANSETRON HCL 4 MG/2ML IJ SOLN
4.0000 mg | Freq: Once | INTRAMUSCULAR | Status: AC
  Administered 2018-10-05: 4 mg via INTRAVENOUS
  Filled 2018-10-05: qty 2

## 2018-10-05 NOTE — ED Notes (Signed)
Pt given crackers and regular sprite for Fluid/PO challenge.

## 2018-10-05 NOTE — ED Provider Notes (Signed)
[facility] EMERGENCY DEPARTMENT Provider Note   CSN: [id number] Arrival date & time: 10/05/18  1728     History   Chief Complaint Chief Complaint  Patient presents with  . Fever  . Cough    HPI Clifford Shields is a 33 y.o. male with a history of HIV and hypertension presenting to the emergency department today with multiple medical complaints, all x 2 days.  Fever Pt with fever. He reports Tmax of 105 just prior to arrival. He did not take any OTC medications for fever before coming in. Pt reports associated symptom of generalized body aches, nausea, and vomiting. He has had 1 episode of non bilious non bloody emesis. Denies abdominal pain, neck pain, neck stiffness, rash, urinary symptoms.  Cough Pt reports nonproductive cough. He has taken Robitussin without relief. The cough is present at rest and with exertion.  Pt reports associated symptom of sore throat describing it as feeling itchy. Denies dysnpea, difficulty swallowing, chest pain.  Headache Describing the headache as pressure over his temples radiating to the back of his head. Denies visual changes, aura, weakness.  Pt reports he is compliant with his HIV medication but does not know his most recent CD4 count. Pt admits to sick contact at work with similar symptoms. He did not receive influenza vaccine this year.  History provided by the patient.  Past Medical History:  Diagnosis Date  . HIV disease (HCC)   . Hypertension   . Marijuana use     Patient Active Problem List   Diagnosis Date Noted  . Rash and nonspecific skin eruption 11/05/2016  . Chlamydia infection 11/05/2016  . Inguinal adenopathy 05/28/2015  . Testosterone deficiency 03/29/2015  . Vitamin D deficiency 03/29/2015  . Marijuana abuse 03/29/2015  . Tibial plateau fracture 03/28/2015  . Other chest pain 11/15/2014  . Closed fracture of unspecified phalanx or phalanges of hand 04/04/2014  . Tobacco abuse 02/22/2013  .  Essential hypertension, benign 12/24/2009  . Human immunodeficiency virus (HIV) disease (HCC) 11/23/2006    Past Surgical History:  Procedure Laterality Date  . ORIF TIBIA PLATEAU Left 03/28/2015   Procedure: OPEN REDUCTION INTERNAL FIXATION (ORIF) LEFT TIBIAL PLATEAU FRACTURE;  Surgeon: Myrene Galas, MD;  Location: [facility] OR;  Service: Orthopedics;  Laterality: Left;        Home Medications    Prior to Admission medications   Medication Sig Start Date End Date Taking? Authorizing Provider  albuterol (PROVENTIL HFA;VENTOLIN HFA) 108 (90 Base) MCG/ACT inhaler Inhale 1-2 puffs into the lungs every 6 (six) hours as needed for wheezing or shortness of breath. 12/16/17   Lurline Idol, FNP  aspirin EC 325 MG tablet Take 1 tablet (325 mg total) by mouth every 12 (twelve) hours. 03/29/15   [name], PA-C  cholecalciferol 1000 UNITS tablet Take 1 tablet (1,000 Units total) by mouth every 12 (twelve) hours. Patient not taking: Reported on 07/15/2017 03/29/15   [name], PA-C  diphenhydrAMINE (BENADRYL) 12.5 MG/5ML liquid Take 25 mg by mouth 4 (four) times daily as needed. Reported on 09/03/2015    [provider]  elvitegravir-cobicistat-emtricitabine-tenofovir (GENVOYA) 150-150-200-10 MG TABS tablet Take 1 tablet by mouth daily. 03/10/18   Judyann Munson, MD  elvitegravir-cobicistat-emtricitabine-tenofovir (GENVOYA) 150-150-200-10 MG TABS tablet Take 1 tablet by mouth daily with breakfast. 03/15/18   Comer, Belia Heman, MD  GENVOYA 150-150-200-10 MG TABS tablet TAKE 1 TABLET BY MOUTH DAILY 05/05/18   Judyann Munson, MD  lisinopril (ZESTRIL) 10 MG tablet Take 1 tablet (  10 mg total) by mouth daily. 10/13/17   Gardiner Barefoot, MD  ondansetron (ZOFRAN ODT) 4 MG disintegrating tablet Take 1 tablet (4 mg total) by mouth every 8 (eight) hours as needed for nausea or vomiting. 10/05/18   , Caroleen Hamman, PA-C  oseltamivir (TAMIFLU) 75 MG capsule Take 1 capsule (75 mg total) by mouth every 12  (twelve) hours. 10/05/18   ,  E, PA-C  trimethoprim-polymyxin b (POLYTRIM) ophthalmic solution Place 1 drop into the right eye every 4 (four) hours. 07/13/17   Blue, Olivia C, PA-C  valACYclovir (VALTREX) 1000 MG tablet Take 1 tablet (1,000 mg total) by mouth 2 (two) times daily. 07/15/17   Judyann Munson, MD    Family History Family History  Problem Relation Age of Onset  . Kidney disease Mother   . Kidney disease Father     Social History Social History   Tobacco Use  . Smoking status: Current Every Day Smoker    Packs/day: 1.00    Years: 15.00    Pack years: 15.00    Types: Cigarettes  . Smokeless tobacco: Never Used  . Tobacco comment: not smioking now due to cough  Substance Use Topics  . Alcohol use: Yes    Alcohol/week: 0.0 standard drinks    Comment: rarely /vodka  . Drug use: Yes    Frequency: 14.0 times per week    Types: Marijuana     Allergies   Patient has no known allergies.   Review of Systems Review of Systems  Constitutional: Positive for fever. Negative for chills.  HENT: Positive for congestion and sore throat. Negative for sinus pressure.   Eyes: Negative for pain and visual disturbance.  Respiratory: Negative for chest tightness and shortness of breath.   Cardiovascular: Negative for chest pain and palpitations.  Gastrointestinal: Positive for nausea and vomiting. Negative for abdominal pain and diarrhea.  Genitourinary: Negative for difficulty urinating and hematuria.  Musculoskeletal: Negative for back pain, neck pain and neck stiffness.  Skin: Negative for rash and wound.  Allergic/Immunologic: Positive for immunocompromised state.  Neurological: Positive for weakness and headaches. Negative for dizziness, syncope and numbness.     Physical Exam Updated Vital Signs BP (!) 141/79   Pulse 94   Temp 99.5 F (37.5 C)   Resp 20   Ht 5\' 10"  (1.778 m)   Wt 83.9 kg   SpO2 99%   BMI 26.54 kg/m   Physical Exam Vitals signs  and nursing note reviewed.  Constitutional:      Appearance: He is ill-appearing. He is not toxic-appearing.  HENT:     Head: Normocephalic and atraumatic.     Right Ear: Tympanic membrane normal.     Left Ear: Tympanic membrane normal.     Nose: Nose normal.     Mouth/Throat:     Mouth: Mucous membranes are dry.     Pharynx: Oropharynx is clear.  Eyes:     General: No scleral icterus.    Extraocular Movements: Extraocular movements intact.     Conjunctiva/sclera: Conjunctivae normal.     Pupils: Pupils are equal, round, and reactive to light.  Neck:     Musculoskeletal: Normal range of motion. No muscular tenderness.  Cardiovascular:     Rate and Rhythm: Regular rhythm. Tachycardia present.     Pulses: Normal pulses.     Heart sounds: Normal heart sounds.  Pulmonary:     Effort: Pulmonary effort is normal.     Breath sounds: Normal breath sounds.  Abdominal:     General: There is no distension.     Palpations: Abdomen is soft.     Tenderness: There is no abdominal tenderness. There is no guarding or rebound.  Musculoskeletal: Normal range of motion.  Skin:    General: Skin is warm and dry.     Capillary Refill: Capillary refill takes less than 2 seconds.     Findings: No rash.  Neurological:     Mental Status: He is alert and oriented to person, place, and time. Mental status is at baseline.     Motor: No weakness.  Psychiatric:        Behavior: Behavior normal.      ED Treatments / Results  Labs (all labs ordered are listed, but only abnormal results are displayed) Labs Reviewed  LACTIC ACID, PLASMA - Abnormal; Notable for the following components:      Result Value   Lactic Acid, Venous 2.2 (*)    All other components within normal limits  COMPREHENSIVE METABOLIC PANEL - Abnormal; Notable for the following components:   Creatinine, Ser 1.38 (*)    All other components within normal limits  CBC WITH DIFFERENTIAL/PLATELET - Abnormal; Notable for the following  components:   Hemoglobin 12.6 (*)    All other components within normal limits  URINALYSIS, ROUTINE W REFLEX MICROSCOPIC - Abnormal; Notable for the following components:   Specific Gravity, Urine 1.032 (*)    Ketones, ur 5 (*)    Protein, ur 30 (*)    All other components within normal limits  INFLUENZA PANEL BY PCR (TYPE A & B) - Abnormal; Notable for the following components:   Influenza A By PCR POSITIVE (*)    All other components within normal limits  LACTIC ACID, PLASMA    EKG None  Radiology Dg Chest 2 View  Result Date: 10/05/2018 CLINICAL DATA:  Patient's sick for 2 days. EXAM: CHEST - 2 VIEW COMPARISON:  December 16, 2017 FINDINGS: The heart size and mediastinal contours are within normal limits. Both lungs are clear. The visualized skeletal structures are unremarkable. IMPRESSION: No active cardiopulmonary disease. Electronically Signed   By: Gerome Samavid  Williams III M.D   On: 10/05/2018 18:25    Procedures Procedures (including critical care time)  Medications Ordered in ED Medications  sodium chloride flush (NS) 0.9 % injection 3 mL (3 mLs Intravenous Given 10/05/18 2016)  acetaminophen (TYLENOL) tablet 650 mg (650 mg Oral Given 10/05/18 1739)  sodium chloride 0.9 % bolus 1,000 mL (0 mLs Intravenous Stopped 10/05/18 2255)  ondansetron (ZOFRAN) injection 4 mg (4 mg Intravenous Given 10/05/18 2122)  ibuprofen (ADVIL,MOTRIN) tablet 800 mg (800 mg Oral Given 10/05/18 2122)  oseltamivir (TAMIFLU) capsule 75 mg (75 mg Oral Given 10/05/18 2254)     Initial Impression / Assessment and Plan / ED Course  I have reviewed the triage vital signs and the nursing notes.  Pertinent labs & imaging results that were available during my care of the patient were reviewed by me and considered in my medical decision making (see chart for details).   Patient febrile to 102.9 and tachycardic in triage, given Tylenol.  Chest x-ray is negative for acute infectious process. CBC is unremarkable. CMP  with slight bump in creatinine to 1.38 from his most recent of 1.20 x 5 months ago. UA is not suggestive of UTI. Lactic acid elevated to 2.2, repeat  trended down to 1.8.  Will treat with IV fluids, Zofran.   Flu swab is positive for Flu  A. Nausea improved after Zofran and pt is tolerating PO fluids. Tachycardia and fever improved after fluids. Will give first dose of Tamiflu and send prescription to his pharmacy. Discussed symptomatic treatment and strict ED return precautions. Pt verbalized understanding of and is in agreement with this plan. Pt stable for discharge home at this time.  Pt case discussed with Dr. Silverio LayYao who agrees with my plan.       Final Clinical Impressions(s) / ED Diagnoses   Final diagnoses:  Influenza A    ED Discharge Orders         Ordered    oseltamivir (TAMIFLU) 75 MG capsule  Every 12 hours     10/05/18 2313    ondansetron (ZOFRAN ODT) 4 MG disintegrating tablet  Every 8 hours PRN     10/05/18 2313           Sherene Sireslbrizze,  E, PA-C 10/05/18 2328    Charlynne PanderYao, David Hsienta, MD 10/05/18 [phone]

## 2018-10-05 NOTE — Discharge Instructions (Signed)
You have the flu this is a viral infection that will likely start to improve after 5-7 days.You were given a prescription for Tamiflu, please take as prescribed.  Please make sure you are drinking plenty of fluids. You can treat your symptoms supportively with tylenol/ibuprofen for fevers and pains, Zyrtec and Flonase to heal with nasal congestion, and over the counter cough syrups and throat lozenges to help with cough. If your symptoms are not improving please follow up with you Primary doctor. Follow up with your primary care doctor to have your CD4 checked.  If you develop persistent fevers, shortness of breath or difficulty breathing, chest pain, severe headache and neck pain, persistent nausea and vomiting or other new or concerning symptoms return to the Emergency department.

## 2018-10-05 NOTE — ED Triage Notes (Signed)
Pt. Stated, I have a high fever and Im coughing.This started last night.

## 2019-01-16 ENCOUNTER — Other Ambulatory Visit: Payer: Self-pay | Admitting: Internal Medicine

## 2019-01-16 DIAGNOSIS — I10 Essential (primary) hypertension: Secondary | ICD-10-CM

## 2019-01-24 ENCOUNTER — Telehealth: Payer: Self-pay | Admitting: *Deleted

## 2019-01-24 NOTE — Telephone Encounter (Signed)
RN received prior authorization request for patient's lisinopril. Per chart, patient hasn't been seen since 07/2017. He states he stopped his Genvoya for 1 month around February/March, but then restarted. He estimates he missed only 30 days.  He is scheduled for in person appointment 5/19.  He accepted appointment.  RN transferred patient to Olegario Messier for RW/ADAP renewal. Per Olegario Messier, patient has insurance and told him he needs to bring his card to his appointment. Andree Coss, RN

## 2019-01-30 ENCOUNTER — Telehealth: Payer: Self-pay | Admitting: Internal Medicine

## 2019-01-30 NOTE — Telephone Encounter (Signed)
COVID-19 Pre-Screening Questions: ° °Do you currently have a fever (>100 °F), chills or unexplained body aches? No  ° °Are you currently experiencing new cough, shortness of breath, sore throat, runny nose? No  °•  °Have you recently travelled outside the state of [place] in the last 14 days? No  °•  °1. Have you been in contact with someone that is currently pending confirmation of Covid19 testing or has been confirmed to have the Covid19 virus?  No  ° °

## 2019-01-31 ENCOUNTER — Ambulatory Visit: Payer: Self-pay | Admitting: Internal Medicine

## 2019-03-15 ENCOUNTER — Other Ambulatory Visit: Payer: Self-pay

## 2019-03-15 ENCOUNTER — Ambulatory Visit (HOSPITAL_COMMUNITY)
Admission: EM | Admit: 2019-03-15 | Discharge: 2019-03-15 | Disposition: A | Discharge status: Home / Self Care | Payer: BC Managed Care – PPO | Attending: Urgent Care | Admitting: Urgent Care

## 2019-03-15 ENCOUNTER — Encounter (HOSPITAL_COMMUNITY): Payer: Self-pay | Admitting: Emergency Medicine

## 2019-03-15 DIAGNOSIS — G8929 Other chronic pain: Secondary | ICD-10-CM

## 2019-03-15 DIAGNOSIS — M25562 Pain in left knee: Secondary | ICD-10-CM

## 2019-03-15 DIAGNOSIS — B20 Human immunodeficiency virus [HIV] disease: Secondary | ICD-10-CM

## 2019-03-15 DIAGNOSIS — I10 Essential (primary) hypertension: Secondary | ICD-10-CM

## 2019-03-15 DIAGNOSIS — R21 Rash and other nonspecific skin eruption: Secondary | ICD-10-CM | POA: Diagnosis not present

## 2019-03-15 DIAGNOSIS — Z8781 Personal history of (healed) traumatic fracture: Secondary | ICD-10-CM

## 2019-03-15 MED ORDER — BETAMETHASONE DIPROPIONATE 0.05 % EX OINT: TOPICAL_OINTMENT | Freq: Two times a day (BID) | CUTANEOUS | 0 refills | Status: DC

## 2019-03-15 MED ORDER — NAPROXEN 375 MG PO TABS: 375.0000 mg | ORAL_TABLET | Freq: Two times a day (BID) | ORAL | 0 refills | Status: DC

## 2019-03-15 NOTE — ED Triage Notes (Signed)
PT reports pain and rash on left knee. Intermittent. Sometimes he can get pus out of affected area.

## 2019-03-15 NOTE — Discharge Instructions (Addendum)
[facility] Dermat[facility]

## 2019-03-15 NOTE — ED Provider Notes (Signed)
MRN: [id number] DOB: 02/24/1986  Subjective:   Clifford Shields is a 33 y.o. male presenting for 1 year history of persistent rash over his left lower leg.  Reports that the lesions are circular, moderate to severely pruritic, intermittently painful.  He is used hydrocortisone with intermittent relief.  Also reports persistent left lower leg pain and knee pain related to his surgery, has a history of tibial fracture.  He believes that this rash might be related to the metal plate from his surgery.  He did not have good follow-up with his orthopedist, only went for 1 recheck.  He has not got back to see them.  He does not have a dermatologist either.  Patient has HIV, managed with Genvoya.  He also has hypertension managed by his PCP.  No current facility-administered medications for this encounter.   Current Outpatient Medications:  .  elvitegravir-cobicistat-emtricitabine-tenofovir (GENVOYA) 150-150-200-10 MG TABS tablet, Take 1 tablet by mouth daily., Disp: 30 tablet, Rfl: 3 .  albuterol (PROVENTIL HFA;VENTOLIN HFA) 108 (90 Base) MCG/ACT inhaler, Inhale 1-2 puffs into the lungs every 6 (six) hours as needed for wheezing or shortness of breath., Disp: 1 Inhaler, Rfl: 0 .  aspirin EC 325 MG tablet, Take 1 tablet (325 mg total) by mouth every 12 (twelve) hours., Disp: 30 tablet, Rfl: 0 .  cholecalciferol 1000 UNITS tablet, Take 1 tablet (1,000 Units total) by mouth every 12 (twelve) hours. (Patient not taking: Reported on 07/15/2017), Disp: 90 tablet, Rfl: 4 .  diphenhydrAMINE (BENADRYL) 12.5 MG/5ML liquid, Take 25 mg by mouth 4 (four) times daily as needed. Reported on 09/03/2015, Disp: , Rfl:  .  elvitegravir-cobicistat-emtricitabine-tenofovir (GENVOYA) 150-150-200-10 MG TABS tablet, Take 1 tablet by mouth daily with breakfast., Disp: 30 tablet, Rfl: 2 .  GENVOYA 150-150-200-10 MG TABS tablet, TAKE 1 TABLET BY MOUTH DAILY, Disp: 30 tablet, Rfl: 0 .  lisinopril (ZESTRIL) 10 MG tablet, TAKE 1  TABLET(10 MG) BY MOUTH DAILY, Disp: 30 tablet, Rfl: 0 .  ondansetron (ZOFRAN ODT) 4 MG disintegrating tablet, Take 1 tablet (4 mg total) by mouth every 8 (eight) hours as needed for nausea or vomiting., Disp: 20 tablet, Rfl: 0 .  valACYclovir (VALTREX) 1000 MG tablet, Take 1 tablet (1,000 mg total) by mouth 2 (two) times daily., Disp: 14 tablet, Rfl: 1   No Known Allergies  Past Medical History:  Diagnosis Date  . HIV disease (HCC)   . Hypertension   . Marijuana use      Past Surgical History:  Procedure Laterality Date  . ORIF TIBIA PLATEAU Left 03/28/2015   Procedure: OPEN REDUCTION INTERNAL FIXATION (ORIF) LEFT TIBIAL PLATEAU FRACTURE;  Surgeon: Myrene GalasMichael Handy, MD;  [facility];  Service: Orthopedics;  Laterality: Left;    ROS  Objective:   Vitals: BP (!) 158/107   Pulse 98   Temp 98.9 F (37.2 C) (Oral)   Resp 16   SpO2 98%   Physical Exam Constitutional:      Appearance: Normal appearance. He is well-developed and normal weight.  HENT:     Head: Normocephalic and atraumatic.     Right Ear: External ear normal.     Left Ear: External ear normal.     Nose: Nose normal.     Mouth/Throat:     Pharynx: Oropharynx is clear.  Eyes:     Extraocular Movements: Extraocular movements intact.     Pupils: Pupils are equal, round, and reactive to light.  Cardiovascular:     Rate and Rhythm: Normal rate.  Pulmonary:     Effort: Pulmonary effort is normal.  Musculoskeletal:     Left knee: He exhibits decreased range of motion and swelling (trace). Tenderness found. Medial joint line, lateral joint line and patellar tendon tenderness noted.       Legs:  Neurological:     Mental Status: He is alert and oriented to person, place, and time.  Psychiatric:        Mood and Affect: Mood normal.        Behavior: Behavior normal.    Assessment and Plan :   1. Rash and nonspecific skin eruption   2. Chronic pain of left knee   3. HIV disease ([place])   4. Essential  hypertension   5. History of lower leg fracture    Offered patient a knee x-ray which she declined due to cost burden.  Recommended he return to his orthopedic surgeon for a follow-up and consult on his chronic left knee and lower leg pain following his surgery.  In the meantime we will use naproxen for his pain and inflammation.  Patient will contact [facility] dermatology for consult on his chronic rash, consideration for biopsy.  I suspect this is inflammatory type rash such as nummular eczema/dermatitis and will use betamethasone to try and address this. Counseled patient on potential for adverse effects with medications prescribed/recommended today, ER and return-to-clinic precautions discussed, patient verbalized understanding.    Jaynee Eagles, PA-C 03/15/19 1620

## 2019-05-29 ENCOUNTER — Telehealth: Payer: Self-pay | Admitting: Pharmacy Technician

## 2019-05-29 ENCOUNTER — Other Ambulatory Visit: Payer: BC Managed Care – PPO

## 2019-05-29 ENCOUNTER — Other Ambulatory Visit: Payer: Self-pay

## 2019-05-29 ENCOUNTER — Ambulatory Visit: Payer: BC Managed Care – PPO

## 2019-05-29 ENCOUNTER — Other Ambulatory Visit (HOSPITAL_COMMUNITY)
Admission: RE | Admit: 2019-05-29 | Discharge: 2019-05-29 | Disposition: A | Discharge status: Home / Self Care | Payer: BC Managed Care – PPO | Source: Ambulatory Visit | Attending: Internal Medicine | Admitting: Internal Medicine

## 2019-05-29 DIAGNOSIS — B20 Human immunodeficiency virus [HIV] disease: Secondary | ICD-10-CM

## 2019-05-29 DIAGNOSIS — Z113 Encounter for screening for infections with a predominantly sexual mode of transmission: Secondary | ICD-10-CM | POA: Insufficient documentation

## 2019-05-29 NOTE — Telephone Encounter (Signed)
[facility] Patient Advocate Encounter  Met with the patient this morning and helped him get set up with a [employer] Copayment card and how to activate on their website. He requested the physical card so that he could keep it in his wallet. He is currently employed by [employer] but not working out of concern for [employer] exposure, the company has kept his insurance active during his time of not working. Unfortunately, it appears that this is one of the pharmacy coverage plans with BCBS that does not cover the specialty medicine making his copay $3177.00. The insurance billing info is listed below:  BIN: [id number] PCN: [id number] GRPArmando Reichert ID: [id number] [employer] [phone]   The [employer] copay card will cover for a couple months and restart 09/15/2019. I instructed the patient to call us in December if he runs into problems with his copay amount before his card reloads. We can apply for PAF at that time. The patient will provide [place] new income documents at that time. [employer] billing information is listed below:  BIN: [id number] PCN: [id number] GRP: [id number] ID: [id number]

## 2019-05-31 LAB — HELPER T-LYMPH-CD4 (ARMC ONLY)
% CD 4 Pos. Lymph.: 33.9 % (ref 30.8–58.5)
Absolute CD 4 Helper: 1153 /uL (ref 359–1519)
Basophils Absolute: 0 10*3/uL (ref 0.0–0.2)
Basos: 1 %
EOS (ABSOLUTE): 0.3 10*3/uL (ref 0.0–0.4)
Eos: 5 %
Hematocrit: 41.4 % (ref 37.5–51.0)
Hemoglobin: 12.5 g/dL — ABNORMAL LOW (ref 13.0–17.7)
Immature Grans (Abs): 0 10*3/uL (ref 0.0–0.1)
Immature Granulocytes: 0 %
Lymphocytes Absolute: 3.4 10*3/uL — ABNORMAL HIGH (ref 0.7–3.1)
Lymphs: 54 %
MCH: 26.7 pg (ref 26.6–33.0)
MCHC: 30.2 g/dL — ABNORMAL LOW (ref 31.5–35.7)
MCV: 89 fL (ref 79–97)
Monocytes Absolute: 0.4 10*3/uL (ref 0.1–0.9)
Monocytes: 7 %
Neutrophils Absolute: 2.1 10*3/uL (ref 1.4–7.0)
Neutrophils: 33 %
Platelets: 217 10*3/uL (ref 150–450)
RBC: 4.68 x10E6/uL (ref 4.14–5.80)
RDW: 14.6 % (ref 11.6–15.4)
WBC: 6.4 10*3/uL (ref 3.4–10.8)

## 2019-05-31 LAB — URINE CYTOLOGY ANCILLARY ONLY
Chlamydia: NEGATIVE
Neisseria Gonorrhea: NEGATIVE

## 2019-06-01 LAB — CBC WITH DIFFERENTIAL/PLATELET
Absolute Monocytes: 509 cells/uL (ref 200–950)
Basophils Absolute: 20 cells/uL (ref 0–200)
Basophils Relative: 0.3 %
Eosinophils Absolute: 409 cells/uL (ref 15–500)
Eosinophils Relative: 6.1 %
HCT: 38.7 % (ref 38.5–50.0)
Hemoglobin: 12.7 g/dL — ABNORMAL LOW (ref 13.2–17.1)
Lymphs Abs: 3491 cells/uL (ref 850–3900)
MCH: 27.4 pg (ref 27.0–33.0)
MCHC: 32.8 g/dL (ref 32.0–36.0)
MCV: 83.6 fL (ref 80.0–100.0)
MPV: 12 fL (ref 7.5–12.5)
Monocytes Relative: 7.6 %
Neutro Abs: 2271 cells/uL (ref 1500–7800)
Neutrophils Relative %: 33.9 %
Platelets: 226 10*3/uL (ref 140–400)
RBC: 4.63 10*6/uL (ref 4.20–5.80)
RDW: 14 % (ref 11.0–15.0)
Total Lymphocyte: 52.1 %
WBC: 6.7 10*3/uL (ref 3.8–10.8)

## 2019-06-01 LAB — HIV-1 RNA QUANT-NO REFLEX-BLD
HIV 1 RNA Quant: <20 copies/mL — AB
HIV-1 RNA Quant, Log: <1.3 Log copies/mL — AB

## 2019-06-01 LAB — COMPLETE METABOLIC PANEL WITH GFR
AG Ratio: 1.6 (calc) (ref 1.0–2.5)
ALT: 36 U/L (ref 9–46)
AST: 28 U/L (ref 10–40)
Albumin: 4.5 g/dL (ref 3.6–5.1)
Alkaline phosphatase (APISO): 76 U/L (ref 36–130)
BUN: 15 mg/dL (ref 7–25)
CO2: 26 mmol/L (ref 20–32)
Calcium: 9.3 mg/dL (ref 8.6–10.3)
Chloride: 109 mmol/L (ref 98–110)
Creat: 1.26 mg/dL (ref 0.60–1.35)
GFR, Est African American: 88 mL/min/{1.73_m2} (ref >=60)
GFR, Est Non African American: 76 mL/min/{1.73_m2} (ref >=60)
Globulin: 2.8 g/dL (calc) (ref 1.9–3.7)
Glucose, Bld: 96 mg/dL (ref 65–99)
Potassium: 3.9 mmol/L (ref 3.5–5.3)
Sodium: 142 mmol/L (ref 135–146)
Total Bilirubin: 0.3 mg/dL (ref 0.2–1.2)
Total Protein: 7.3 g/dL (ref 6.1–8.1)

## 2019-06-01 LAB — RPR: RPR Ser Ql: NONREACTIVE

## 2019-06-05 ENCOUNTER — Other Ambulatory Visit: Payer: Self-pay | Admitting: Internal Medicine

## 2019-06-05 DIAGNOSIS — B20 Human immunodeficiency virus [HIV] disease: Secondary | ICD-10-CM

## 2019-06-27 ENCOUNTER — Telehealth: Payer: Self-pay

## 2019-06-27 ENCOUNTER — Other Ambulatory Visit: Payer: Self-pay

## 2019-06-27 NOTE — Telephone Encounter (Signed)
COVID-19 Pre-Screening Questions:06/27/19   Do you currently have a fever (>100 F), chills or unexplained body aches?NO  . Are you currently experiencing new cough, shortness of breath, sore throat, runny nose?NO .  Have you recently travelled outside the state of [place] in the last 14 days? NO .  Have you been in contact with someone that is currently pending confirmation of Covid19 testing or has been confirmed to have the Covid19 virus?  NO  **If the patient answers NO to ALL questions -  advise the patient to please call the clinic before coming to the office should any symptoms develop.     

## 2019-06-28 ENCOUNTER — Telehealth: Payer: Self-pay | Admitting: *Deleted

## 2019-06-28 ENCOUNTER — Encounter: Payer: BC Managed Care – PPO | Admitting: Internal Medicine

## 2019-06-28 NOTE — Telephone Encounter (Signed)
RN called patient 10 minutes after his appointment time to see if he was on his way.  Call went to voicemail. Patient has not been seen by MD since 2018, but is still receiving medication and is well controlled.  OK to schedule 1 virtual visit per Dr Linus Salmons if patient calls back. Landis Gandy, RN

## 2019-07-28 ENCOUNTER — Telehealth: Payer: Self-pay

## 2019-07-28 NOTE — Telephone Encounter (Signed)
COVID-19 Pre-Screening Questions:07/28/19  Do you currently have a fever (>100 F), chills or unexplained body aches?NO  Are you currently experiencing new cough, shortness of breath, sore throat, runny nose? NO .  Have you recently travelled outside the state of [place] in the last 14 days?NO .  Have you been in contact with someone that is currently pending confirmation of Covid19 testing or has been confirmed to have the Covid19 virus?  NO  **If the patient answers NO to ALL questions -  advise the patient to please call the clinic before coming to the office should any symptoms develop.    

## 2019-07-31 ENCOUNTER — Other Ambulatory Visit: Payer: Self-pay

## 2019-07-31 ENCOUNTER — Encounter: Payer: Self-pay | Admitting: Internal Medicine

## 2019-07-31 ENCOUNTER — Other Ambulatory Visit (HOSPITAL_COMMUNITY)
Admission: RE | Admit: 2019-07-31 | Discharge: 2019-07-31 | Disposition: A | Discharge status: Home / Self Care | Payer: BC Managed Care – PPO | Source: Ambulatory Visit | Attending: Internal Medicine | Admitting: Internal Medicine

## 2019-07-31 ENCOUNTER — Ambulatory Visit (INDEPENDENT_AMBULATORY_CARE_PROVIDER_SITE_OTHER): Payer: BC Managed Care – PPO | Admitting: Internal Medicine

## 2019-07-31 VITALS — BP 188/95 | HR 86 | Wt 224.0 lb

## 2019-07-31 DIAGNOSIS — Z113 Encounter for screening for infections with a predominantly sexual mode of transmission: Secondary | ICD-10-CM | POA: Insufficient documentation

## 2019-07-31 DIAGNOSIS — Z720 Tobacco use: Secondary | ICD-10-CM | POA: Diagnosis not present

## 2019-07-31 DIAGNOSIS — Z79899 Other long term (current) drug therapy: Secondary | ICD-10-CM | POA: Diagnosis not present

## 2019-07-31 DIAGNOSIS — Z23 Encounter for immunization: Secondary | ICD-10-CM | POA: Diagnosis not present

## 2019-07-31 DIAGNOSIS — B20 Human immunodeficiency virus [HIV] disease: Secondary | ICD-10-CM | POA: Insufficient documentation

## 2019-07-31 DIAGNOSIS — R21 Rash and other nonspecific skin eruption: Secondary | ICD-10-CM

## 2019-07-31 DIAGNOSIS — I10 Essential (primary) hypertension: Secondary | ICD-10-CM

## 2019-07-31 MED ORDER — GENVOYA 150-150-200-10 MG PO TABS: 1.0000 | ORAL_TABLET | Freq: Every day | ORAL | 6 refills | Status: DC

## 2019-07-31 NOTE — Progress Notes (Signed)
   Subjective:    Patient ID: Clifford Shields, male    DOB: 09-22-1985, 33 y.o.   MRN: [id number]  HPI Here for follow up of HIV Has very sporadic follow up and out of refills after multiple no shows.  Last labs in September good with a good CD4 count 1153, viral load < 20.  On Genvoya.  He does miss some doses occasionally.  Not more than 1 at a time.  Has not been on his lisinopril for his HTN and does not go to a PCP.  Has a stable partner for 2 years.     Review of Systems  Constitutional: Negative for fatigue and unexpected weight change.  Gastrointestinal: Negative for diarrhea.  Skin: Negative for rash.       Objective:   Physical Exam Constitutional:      Appearance: Normal appearance.  Eyes:     General: No scleral icterus. Cardiovascular:     Rate and Rhythm: Normal rate and regular rhythm.     Heart sounds: No murmur.  Pulmonary:     Effort: Pulmonary effort is normal.  Skin:    Findings: No rash.  Neurological:     General: No focal deficit present.     Mental Status: He is alert.   SH: + tobacco        Assessment & Plan:

## 2019-07-31 NOTE — Assessment & Plan Note (Signed)
Lipid panel today

## 2019-07-31 NOTE — Assessment & Plan Note (Signed)
Doing well on the genvoya despite some missed doses.  I will recheck his virus today and he can rtc 6 months.

## 2019-07-31 NOTE — Assessment & Plan Note (Signed)
Will screen today 

## 2019-07-31 NOTE — Assessment & Plan Note (Signed)
Counseled on vaccines and given Prevnar 13, menveo #2 and Flu shot.

## 2019-07-31 NOTE — Assessment & Plan Note (Signed)
Counseled on quitting °

## 2019-07-31 NOTE — Assessment & Plan Note (Signed)
Seems to be eczema and has a steroid cream that is effective.

## 2019-07-31 NOTE — Assessment & Plan Note (Signed)
BP is elevated.  He has been given information about getting established with a PCP.

## 2019-08-01 LAB — T-HELPER CELL (CD4) - (RCID CLINIC ONLY)
CD4 % Helper T Cell: 34 % (ref 33–65)
CD4 T Cell Abs: 737 /uL (ref 400–1790)

## 2019-08-01 LAB — CYTOLOGY, (ORAL, ANAL, URETHRAL) ANCILLARY ONLY
Chlamydia: NEGATIVE
Chlamydia: NEGATIVE
Comment: NEGATIVE
Comment: NEGATIVE
Comment: NORMAL
Comment: NORMAL
Neisseria Gonorrhea: NEGATIVE
Neisseria Gonorrhea: NEGATIVE

## 2019-08-01 LAB — URINE CYTOLOGY ANCILLARY ONLY
Chlamydia: NEGATIVE
Comment: NEGATIVE
Comment: NORMAL
Neisseria Gonorrhea: NEGATIVE

## 2019-08-07 ENCOUNTER — Telehealth: Payer: Self-pay | Admitting: General Practice

## 2019-08-07 NOTE — Telephone Encounter (Signed)
Called pt LVM for him to call us back to set up NP appt FR

## 2019-08-11 LAB — HIV-1 RNA QUANT-NO REFLEX-BLD
HIV 1 RNA Quant: <20 copies/mL — AB
HIV-1 RNA Quant, Log: <1.3 Log copies/mL — AB

## 2019-08-11 LAB — LIPID PANEL
Cholesterol: 202 mg/dL — ABNORMAL HIGH (ref <200)
HDL: 38 mg/dL — ABNORMAL LOW (ref >=40)
LDL Cholesterol (Calc): 127 mg/dL (calc) — ABNORMAL HIGH
Non-HDL Cholesterol (Calc): 164 mg/dL (calc) — ABNORMAL HIGH (ref <130)
Total CHOL/HDL Ratio: 5.3 (calc) — ABNORMAL HIGH (ref <5.0)
Triglycerides: 228 mg/dL — ABNORMAL HIGH (ref <150)

## 2019-08-22 ENCOUNTER — Ambulatory Visit (INDEPENDENT_AMBULATORY_CARE_PROVIDER_SITE_OTHER): Payer: BC Managed Care – PPO | Admitting: Registered Nurse

## 2019-08-22 ENCOUNTER — Encounter: Payer: Self-pay | Admitting: Registered Nurse

## 2019-08-22 ENCOUNTER — Other Ambulatory Visit: Payer: Self-pay

## 2019-08-22 VITALS — BP 174/86 | HR 94 | Temp 98.6°F | Resp 12 | Ht 70.0 in | Wt 222.0 lb

## 2019-08-22 DIAGNOSIS — R454 Irritability and anger: Secondary | ICD-10-CM

## 2019-08-22 DIAGNOSIS — R002 Palpitations: Secondary | ICD-10-CM | POA: Diagnosis not present

## 2019-08-22 DIAGNOSIS — I10 Essential (primary) hypertension: Secondary | ICD-10-CM

## 2019-08-22 DIAGNOSIS — F411 Generalized anxiety disorder: Secondary | ICD-10-CM | POA: Diagnosis not present

## 2019-08-22 DIAGNOSIS — Z0000 Encounter for general adult medical examination without abnormal findings: Secondary | ICD-10-CM

## 2019-08-22 DIAGNOSIS — Z0001 Encounter for general adult medical examination with abnormal findings: Secondary | ICD-10-CM | POA: Diagnosis not present

## 2019-08-22 NOTE — Patient Instructions (Signed)
° ° ° °  If you have lab work done today you will be contacted with your lab results within the next 2 weeks.  If you have not heard from us then please contact us. The fastest way to get your results is to register for My Chart. ° ° °IF you received an x-ray today, you will receive an invoice from [facility]. Please contact [facility] at [phone] with questions or concerns regarding your invoice.  ° °IF you received labwork today, you will receive an invoice from [facility]. Please contact [facility] at [phone] with questions or concerns regarding your invoice.  ° °Our billing staff will not be able to assist you with questions regarding bills from these companies. ° °You will be contacted with the lab results as soon as they are available. The fastest way to get your results is to activate your My Chart account. Instructions are located on the last page of this paperwork. If you have not heard from us regarding the results in 2 weeks, please contact this office. °  ° ° ° °

## 2019-08-23 MED ORDER — BUSPIRONE HCL 7.5 MG PO TABS: 7.5000 mg | ORAL_TABLET | Freq: Three times a day (TID) | ORAL | 0 refills | Status: DC

## 2019-08-23 NOTE — Progress Notes (Signed)
New Patient Office Visit  Subjective:  Patient ID: Clifford Shields, male    DOB: 12-Apr-1986  Age: 33 y.o. MRN: [id number]  CC:  Chief Complaint  Patient presents with  . Hypertension    pt stated--having fatique, light headed--Dr. Comer-prescribe Rx lisinopril which last taken 3 months again.    HPI Clifford Shields presents for visit to establish care.   Needs refill on lisinopril - tolerating it well. No CV complaints  Med hx of HIV+ - established with [facility], no complaints at this time  Otherwise, feels well today. States his partner has some concerns for his anger, and pt agrees. Feels he is anxious frequently and has some palpitations when he is anxious.   Past Medical History:  Diagnosis Date  . HIV disease (HCC)   . Hypertension   . Marijuana use     Past Surgical History:  Procedure Laterality Date  . ORIF TIBIA PLATEAU Left 03/28/2015   Procedure: OPEN REDUCTION INTERNAL FIXATION (ORIF) LEFT TIBIAL PLATEAU FRACTURE;  Surgeon: Myrene Galas, MD;  Location: [facility] OR;  Service: Orthopedics;  Laterality: Left;    Family History  Problem Relation Age of Onset  . Kidney disease Mother   . Hypertension Mother   . Kidney disease Father   . Hypertension Father   . CVA Father 13    Social History   Socioeconomic History  . Marital status: Media planner    Spouse name: Not on file  . Number of children: 0  . Years of education: Not on file  . Highest education level: Not on file  Occupational History  . Not on file  Social Needs  . Financial resource strain: Not hard at all  . Food insecurity    Worry: Never true    Inability: Never true  . Transportation needs    Medical: No    Non-medical: No  Tobacco Use  . Smoking status: Current Every Day Smoker    Packs/day: 0.50    Years: 15.00    Pack years: 7.50    Types: Cigarettes  . Smokeless tobacco: Never Used  . Tobacco comment: not smioking now due to cough  Substance and Sexual Activity  . Alcohol  use: Yes    Alcohol/week: 0.0 standard drinks    Comment: rarely  . Drug use: Yes    Frequency: 14.0 times per week    Types: Marijuana  . Sexual activity: Yes    Partners: Male    Birth control/protection: Condom    Comment: given condoms  Lifestyle  . Physical activity    Days per week: 3 days    Minutes per session: 30 min  . Stress: Only a little  Relationships  . Social Musician on phone: Three times a week    Gets together: Twice a week    Attends religious service: Patient refused    Active member of club or organization: Patient refused    Attends meetings of clubs or organizations: Patient refused    Relationship status: Living with partner  . Intimate partner violence    Fear of current or ex partner: No    Emotionally abused: No    Physically abused: No    Forced sexual activity: No  Other Topics Concern  . Not on file  Social History Narrative  . Not on file    ROS Review of Systems  Constitutional: Negative.   HENT: Negative.   Eyes: Negative.   Respiratory: Negative.   Cardiovascular:  Positive for palpitations. Negative for chest pain and leg swelling.  Gastrointestinal: Negative.   Endocrine: Negative.   Genitourinary: Negative.   Musculoskeletal: Negative.   Skin: Negative.   Allergic/Immunologic: Negative.   Neurological: Negative.   Hematological: Negative.   Psychiatric/Behavioral: Positive for agitation. Negative for self-injury, sleep disturbance and suicidal ideas. The patient is nervous/anxious.   All other systems reviewed and are negative.   Objective:   Today's Vitals: BP (!) 174/86   Pulse 94   Temp 98.6 F (37 C)   Resp 12   Ht 5\' 10"  (1.778 m)   Wt 222 lb (100.7 kg)   SpO2 97%   BMI 31.85 kg/m   Physical Exam Vitals signs and nursing note reviewed.  Constitutional:      General: He is not in acute distress.    Appearance: Normal appearance. He is normal weight. He is not ill-appearing, toxic-appearing or  diaphoretic.  HENT:     Head: Normocephalic.     Right Ear: Tympanic membrane, ear canal and external ear normal. There is no impacted cerumen.     Left Ear: Tympanic membrane, ear canal and external ear normal. There is no impacted cerumen.     Nose: Nose normal. No rhinorrhea.     Mouth/Throat:     Mouth: Mucous membranes are moist.     Pharynx: Oropharynx is clear. No oropharyngeal exudate or posterior oropharyngeal erythema.  Neck:     Musculoskeletal: Normal range of motion and neck supple.  Cardiovascular:     Rate and Rhythm: Normal rate.     Pulses: Normal pulses.     Heart sounds: Normal heart sounds. No murmur. No friction rub. No gallop.   Pulmonary:     Effort: Pulmonary effort is normal. No respiratory distress.     Breath sounds: Normal breath sounds. No stridor. No wheezing, rhonchi or rales.  Chest:     Chest wall: No tenderness.  Abdominal:     General: Abdomen is flat. Bowel sounds are normal.     Palpations: Abdomen is soft.  Musculoskeletal: Normal range of motion.  Skin:    General: Skin is warm and dry.     Capillary Refill: Capillary refill takes less than 2 seconds.     Coloration: Skin is not jaundiced or pale.     Findings: No bruising, erythema, lesion or rash.  Neurological:     General: No focal deficit present.     Mental Status: He is alert and oriented to person, place, and time. Mental status is at baseline.  Psychiatric:        Mood and Affect: Mood normal.        Behavior: Behavior normal.        Thought Content: Thought content normal.        Judgment: Judgment normal.     Assessment & Plan:   Problem List Items Addressed This Visit    None    Visit Diagnoses    Physical exam    -  Primary   Hypertension, unspecified type       Relevant Orders   EKG 12-Lead (Completed)   Palpitations       Relevant Orders   EKG 12-Lead (Completed)   Anxiety state       Relevant Medications   busPIRone (BUSPAR) 7.5 MG tablet      Outpatient  Encounter Medications as of 08/22/2019  Medication Sig  . augmented betamethasone dipropionate (DIPROLENE-AF) 0.05 % ointment APPLY OINTMENT TOPICALLY TWICE DAILY  .  elvitegravir-cobicistat-emtricitabine-tenofovir (GENVOYA) 150-150-200-10 MG TABS tablet Take 1 tablet by mouth daily.  . busPIRone (BUSPAR) 7.5 MG tablet Take 1 tablet (7.5 mg total) by mouth 3 (three) times daily.  Marland Kitchen. lisinopril (ZESTRIL) 10 MG tablet TAKE 1 TABLET(10 MG) BY MOUTH DAILY (Patient not taking: Reported on 08/22/2019)   No facility-administered encounter medications on file as of 08/22/2019.     Follow-up: No follow-ups on file.   PLAN  No abnormal findings on exam today  Refill lisinopril, return in 2 weeks for bp check  Will trial buspirone for anxiety  Will refer to psych for counseling  Patient encouraged to call clinic with any questions, comments, or concerns.   Janeece Ageeichard Ariyannah Pauling, NP

## 2019-09-11 ENCOUNTER — Ambulatory Visit (INDEPENDENT_AMBULATORY_CARE_PROVIDER_SITE_OTHER): Payer: BC Managed Care – PPO | Admitting: Family Medicine

## 2019-09-11 ENCOUNTER — Other Ambulatory Visit: Payer: Self-pay

## 2019-09-11 VITALS — BP 154/108 | HR 82

## 2019-09-11 DIAGNOSIS — I10 Essential (primary) hypertension: Secondary | ICD-10-CM

## 2019-09-18 ENCOUNTER — Other Ambulatory Visit: Payer: Self-pay | Admitting: Registered Nurse

## 2019-09-18 DIAGNOSIS — I10 Essential (primary) hypertension: Secondary | ICD-10-CM

## 2019-09-18 MED ORDER — LISINOPRIL 10 MG PO TABS: ORAL_TABLET | ORAL | 0 refills | Status: DC

## 2019-09-18 NOTE — Telephone Encounter (Signed)
Pt needs lisinopril send to [facility] [address]. Pt last seen dr Leretha Pol on 09/11/2019

## 2019-12-13 ENCOUNTER — Other Ambulatory Visit: Payer: Self-pay | Admitting: Registered Nurse

## 2019-12-13 ENCOUNTER — Telehealth: Payer: Self-pay | Admitting: Registered Nurse

## 2019-12-13 ENCOUNTER — Other Ambulatory Visit: Payer: Self-pay

## 2019-12-13 DIAGNOSIS — I10 Essential (primary) hypertension: Secondary | ICD-10-CM

## 2019-12-13 NOTE — Telephone Encounter (Signed)
Pt called has talked to [facility] and they are wanting our office to send them a fax of this medication for a refill on   lisinopril (ZESTRIL) 10 MG tablet [[id number]]   Medication. Pt is wanting this sent in today because his insurance runs out today and his next one wont come into effect until weeks later. I did make pt aware that he might need an appt for the med refill. Please advise [phone]  [id number] - [place], [place] - [address] AT [facility] [address] & [address]

## 2019-12-13 NOTE — Telephone Encounter (Signed)
error 

## 2020-02-25 ENCOUNTER — Other Ambulatory Visit: Payer: Self-pay | Admitting: Registered Nurse

## 2020-02-25 DIAGNOSIS — I10 Essential (primary) hypertension: Secondary | ICD-10-CM

## 2020-03-06 ENCOUNTER — Telehealth: Payer: Self-pay

## 2020-03-06 NOTE — Telephone Encounter (Signed)
Received notice from [facility] pharmacy that patient's insurance expired 12/13/19. Called patient to schedule follow up appointment and to get new insurance information. Left voicemail requesting call back.  Detron Carras Loyola Mast, RN

## 2020-03-27 ENCOUNTER — Other Ambulatory Visit: Payer: Self-pay

## 2020-03-27 ENCOUNTER — Ambulatory Visit: Payer: Self-pay

## 2020-03-27 DIAGNOSIS — Z113 Encounter for screening for infections with a predominantly sexual mode of transmission: Secondary | ICD-10-CM

## 2020-03-27 DIAGNOSIS — B20 Human immunodeficiency virus [HIV] disease: Secondary | ICD-10-CM

## 2020-03-28 ENCOUNTER — Encounter: Payer: Self-pay | Admitting: Internal Medicine

## 2020-03-28 LAB — T-HELPER CELL (CD4) - (RCID CLINIC ONLY)
CD4 % Helper T Cell: 35 % (ref 33–65)
CD4 T Cell Abs: 720 /uL (ref 400–1790)

## 2020-03-29 LAB — CBC WITH DIFFERENTIAL/PLATELET
Absolute Monocytes: 302 cells/uL (ref 200–950)
Basophils Absolute: 9 cells/uL (ref 0–200)
Basophils Relative: 0.2 %
Eosinophils Absolute: 180 cells/uL (ref 15–500)
Eosinophils Relative: 4 %
HCT: 39.3 % (ref 38.5–50.0)
Hemoglobin: 12.7 g/dL — ABNORMAL LOW (ref 13.2–17.1)
Lymphs Abs: 2088 cells/uL (ref 850–3900)
MCH: 26.6 pg — ABNORMAL LOW (ref 27.0–33.0)
MCHC: 32.3 g/dL (ref 32.0–36.0)
MCV: 82.4 fL (ref 80.0–100.0)
MPV: 12.1 fL (ref 7.5–12.5)
Monocytes Relative: 6.7 %
Neutro Abs: 1922 cells/uL (ref 1500–7800)
Neutrophils Relative %: 42.7 %
Platelets: 191 10*3/uL (ref 140–400)
RBC: 4.77 10*6/uL (ref 4.20–5.80)
RDW: 13.4 % (ref 11.0–15.0)
Total Lymphocyte: 46.4 %
WBC: 4.5 10*3/uL (ref 3.8–10.8)

## 2020-03-29 LAB — HIV-1 RNA QUANT-NO REFLEX-BLD
HIV 1 RNA Quant: 30500 copies/mL — ABNORMAL HIGH
HIV-1 RNA Quant, Log: 4.48 Log copies/mL — ABNORMAL HIGH

## 2020-03-29 LAB — COMPLETE METABOLIC PANEL WITH GFR
AG Ratio: 1.6 (calc) (ref 1.0–2.5)
ALT: 17 U/L (ref 9–46)
AST: 20 U/L (ref 10–40)
Albumin: 4.2 g/dL (ref 3.6–5.1)
Alkaline phosphatase (APISO): 74 U/L (ref 36–130)
BUN: 9 mg/dL (ref 7–25)
CO2: 24 mmol/L (ref 20–32)
Calcium: 8.8 mg/dL (ref 8.6–10.3)
Chloride: 109 mmol/L (ref 98–110)
Creat: 1.17 mg/dL (ref 0.60–1.35)
GFR, Est African American: 95 mL/min/{1.73_m2} (ref >=60)
GFR, Est Non African American: 82 mL/min/{1.73_m2} (ref >=60)
Globulin: 2.7 g/dL (calc) (ref 1.9–3.7)
Glucose, Bld: 114 mg/dL — ABNORMAL HIGH (ref 65–99)
Potassium: 3.9 mmol/L (ref 3.5–5.3)
Sodium: 143 mmol/L (ref 135–146)
Total Bilirubin: 0.4 mg/dL (ref 0.2–1.2)
Total Protein: 6.9 g/dL (ref 6.1–8.1)

## 2020-03-29 LAB — FLUORESCENT TREPONEMAL AB(FTA)-IGG-BLD: Fluorescent Treponemal ABS: REACTIVE — AB

## 2020-03-29 LAB — RPR TITER: RPR Titer: 1:1 {titer} — ABNORMAL HIGH

## 2020-03-29 LAB — RPR: RPR Ser Ql: REACTIVE — AB

## 2020-04-11 ENCOUNTER — Encounter: Payer: Self-pay | Admitting: Internal Medicine

## 2020-04-11 ENCOUNTER — Other Ambulatory Visit: Payer: Self-pay

## 2020-04-11 ENCOUNTER — Ambulatory Visit (INDEPENDENT_AMBULATORY_CARE_PROVIDER_SITE_OTHER): Payer: Self-pay | Admitting: Internal Medicine

## 2020-04-11 VITALS — BP 172/135 | HR 71 | Wt 211.0 lb

## 2020-04-11 DIAGNOSIS — Z113 Encounter for screening for infections with a predominantly sexual mode of transmission: Secondary | ICD-10-CM

## 2020-04-11 DIAGNOSIS — B20 Human immunodeficiency virus [HIV] disease: Secondary | ICD-10-CM

## 2020-04-11 DIAGNOSIS — Z720 Tobacco use: Secondary | ICD-10-CM

## 2020-04-11 MED ORDER — GENVOYA 150-150-200-10 MG PO TABS: 1.0000 | ORAL_TABLET | Freq: Every day | ORAL | 6 refills | Status: DC

## 2020-04-12 LAB — CYTOLOGY, (ORAL, ANAL, URETHRAL) ANCILLARY ONLY
Chlamydia: NEGATIVE
Chlamydia: NEGATIVE
Comment: NEGATIVE
Comment: NEGATIVE
Comment: NORMAL
Comment: NORMAL
Neisseria Gonorrhea: NEGATIVE
Neisseria Gonorrhea: NEGATIVE

## 2020-04-12 LAB — URINE CYTOLOGY ANCILLARY ONLY
Chlamydia: NEGATIVE
Comment: NEGATIVE
Comment: NORMAL
Neisseria Gonorrhea: NEGATIVE

## 2020-04-15 ENCOUNTER — Encounter: Payer: Self-pay | Admitting: Internal Medicine

## 2020-04-15 NOTE — Assessment & Plan Note (Signed)
Poorly controlled off of medications.  I had a discussion regarding the need to stay on medication and she will restart after the visit.  rtc 4 weeks for labs then follow up with me.

## 2020-04-15 NOTE — Assessment & Plan Note (Signed)
Discussed cessation 

## 2020-04-15 NOTE — Progress Notes (Signed)
   Subjective:    Patient ID: Clifford Shields, male    DOB: 1986-06-19, 34 y.o.   MRN: [id number]  HPI Here for follow up of HIV She unfortunately has been off of her medications for over 1 month.  Her labs confirm this with a viral load of 30,500.  CD4 remains intact at 720.  She is ready to get back on medication and has renewed her HMAP.  Previously had insurance but had difficulty getting the medication.  RPR 1:1 c/w old, treated infection.  No weight loss, no diarrhea.    Review of Systems  Constitutional: Negative for unexpected weight change.  Gastrointestinal: Negative for diarrhea and nausea.  Skin: Negative for rash.       Objective:   Physical Exam Constitutional:      Appearance: Normal appearance.  Eyes:     General: No scleral icterus. Cardiovascular:     Rate and Rhythm: Normal rate and regular rhythm.  Neurological:     General: No focal deficit present.     Mental Status: He is alert.  Psychiatric:        Mood and Affect: Mood normal.   SH:  + tobacco      Assessment & Plan:

## 2020-04-15 NOTE — Assessment & Plan Note (Signed)
Will screen at the next visit

## 2020-04-21 ENCOUNTER — Other Ambulatory Visit: Payer: Self-pay | Admitting: Registered Nurse

## 2020-04-21 ENCOUNTER — Other Ambulatory Visit: Payer: Self-pay | Admitting: Internal Medicine

## 2020-04-21 DIAGNOSIS — I10 Essential (primary) hypertension: Secondary | ICD-10-CM

## 2020-05-09 ENCOUNTER — Other Ambulatory Visit: Payer: Self-pay

## 2020-05-27 ENCOUNTER — Encounter: Payer: Self-pay | Admitting: Internal Medicine

## 2020-07-05 ENCOUNTER — Telehealth: Payer: Self-pay | Admitting: *Deleted

## 2020-07-05 NOTE — Telephone Encounter (Signed)
RN called patient to offer an appointment, as he was detectable at last visit and has no-showed twice.  Received message that "Call could not be completed at this time." Andree Coss, RN

## 2020-09-12 ENCOUNTER — Other Ambulatory Visit: Payer: Self-pay

## 2020-09-30 ENCOUNTER — Encounter: Payer: Self-pay | Admitting: Internal Medicine

## 2020-10-25 ENCOUNTER — Other Ambulatory Visit: Payer: Self-pay | Admitting: Internal Medicine

## 2020-10-25 ENCOUNTER — Other Ambulatory Visit: Payer: Self-pay

## 2020-10-25 DIAGNOSIS — B20 Human immunodeficiency virus [HIV] disease: Secondary | ICD-10-CM

## 2020-11-06 ENCOUNTER — Other Ambulatory Visit (HOSPITAL_COMMUNITY): Payer: Self-pay

## 2020-11-13 ENCOUNTER — Other Ambulatory Visit: Payer: Self-pay

## 2020-11-19 ENCOUNTER — Telehealth: Payer: Self-pay

## 2020-11-19 ENCOUNTER — Ambulatory Visit: Payer: Self-pay | Admitting: Internal Medicine

## 2020-11-19 NOTE — Telephone Encounter (Signed)
Reached out to patient regarding his appointment for 11:15. CMA attempted to connect with patient to reschedule or confirm appointment. Number is not accepting any calls at this time. Left voicemail requesting patient call office.

## 2020-12-05 ENCOUNTER — Telehealth: Payer: Self-pay

## 2020-12-05 NOTE — Telephone Encounter (Signed)
Patient states he wants to restart lisinopril. Patient has not refilled since 04/2020 and no longer has PCP and is uninsured. Routing message to provider for advise.  Valarie Cones

## 2020-12-06 NOTE — Telephone Encounter (Signed)
I would want to know what the recent blood pressure is before restarting medication. There is plenty of availability to get in soon or should get her to a PCP.  She has missed a LOT of appointments.

## 2020-12-09 NOTE — Telephone Encounter (Signed)
Offered patient appointment. Patient accepts in person visit tomorrow to discuss restarting blood pressure medication.  Clifford Shields

## 2020-12-24 ENCOUNTER — Other Ambulatory Visit: Payer: Self-pay

## 2021-01-07 ENCOUNTER — Encounter: Payer: Self-pay | Admitting: Internal Medicine

## 2021-01-23 ENCOUNTER — Other Ambulatory Visit: Payer: Self-pay

## 2021-01-23 ENCOUNTER — Ambulatory Visit (INDEPENDENT_AMBULATORY_CARE_PROVIDER_SITE_OTHER): Payer: Self-pay | Admitting: Pharmacist

## 2021-01-23 ENCOUNTER — Ambulatory Visit: Payer: Self-pay

## 2021-01-23 DIAGNOSIS — Z79899 Other long term (current) drug therapy: Secondary | ICD-10-CM

## 2021-01-23 DIAGNOSIS — I10 Essential (primary) hypertension: Secondary | ICD-10-CM

## 2021-01-23 DIAGNOSIS — B20 Human immunodeficiency virus [HIV] disease: Secondary | ICD-10-CM

## 2021-01-23 DIAGNOSIS — Z113 Encounter for screening for infections with a predominantly sexual mode of transmission: Secondary | ICD-10-CM

## 2021-01-23 MED ORDER — LISINOPRIL 10 MG PO TABS: 1.0000 | ORAL_TABLET | Freq: Every day | ORAL | 0 refills | Status: DC

## 2021-01-23 MED ORDER — GENVOYA 150-150-200-10 MG PO TABS: 1.0000 | ORAL_TABLET | Freq: Every day | ORAL | 3 refills | Status: DC

## 2021-01-23 MED ORDER — AMLODIPINE BESYLATE 5 MG PO TABS: 5.0000 mg | ORAL_TABLET | Freq: Every day | ORAL | 11 refills | Status: DC

## 2021-01-23 NOTE — Progress Notes (Signed)
HPI: Clifford Shields is a 35 y.o. male who presents to the [facility] pharmacy clinic for HIV follow-up.  Patient Active Problem List   Diagnosis Date Noted  . Routine screening for STI (sexually transmitted infection) 07/31/2019  . Encounter for long-term (current) use of high-risk medication 07/31/2019  . Rash and nonspecific skin eruption 11/05/2016  . Inguinal adenopathy 05/28/2015  . Testosterone deficiency 03/29/2015  . Vitamin D deficiency 03/29/2015  . Marijuana abuse 03/29/2015  . Tibial plateau fracture 03/28/2015  . Closed fracture of unspecified phalanx or phalanges of hand 04/04/2014  . Tobacco abuse 02/22/2013  . Essential hypertension, benign 12/24/2009  . Human immunodeficiency virus (HIV) disease (HCC) 11/23/2006    Patient's Medications  New Prescriptions   No medications on file  Previous Medications   AMOXICILLIN (AMOXIL) 500 MG CAPSULE    TAKE 1 CAPSULE BY MOUTH 3 TIMES DAILY FOR DENTAL INFECTION. TAKE UNTIL GONE   AUGMENTED BETAMETHASONE DIPROPIONATE (DIPROLENE-AF) 0.05 % OINTMENT    APPLY OINTMENT TOPICALLY TWICE DAILY   BUSPIRONE (BUSPAR) 7.5 MG TABLET    Take 1 tablet (7.5 mg total) by mouth 3 (three) times daily.   ELVITEGRAVIR-COBICISTAT-EMTRICITABINE-TENOFOVIR (GENVOYA) 150-150-200-10 MG TABS TABLET    Take 1 tablet by mouth daily.   LISINOPRIL (ZESTRIL) 10 MG TABLET    Take 1 tablet by mouth once daily  Modified Medications   No medications on file  Discontinued Medications   No medications on file    Allergies: No Known Allergies  Past Medical History: Past Medical History:  Diagnosis Date  . HIV disease (HCC)   . Hypertension   . Marijuana use     Social History: Social History   Socioeconomic History  . Marital status: Media planner    Spouse name: Not on file  . Number of children: 0  . Years of education: Not on file  . Highest education level: Not on file  Occupational History  . Not on file  Tobacco Use  . Smoking status:  Current Every Day Smoker    Packs/day: 0.50    Years: 15.00    Pack years: 7.50    Types: Cigarettes  . Smokeless tobacco: Never Used  Vaping Use  . Vaping Use: Never used  Substance and Sexual Activity  . Alcohol use: Yes    Alcohol/week: 0.0 standard drinks    Comment: rarely  . Drug use: Not Currently    Frequency: 14.0 times per week    Types: Marijuana  . Sexual activity: Yes    Partners: Male    Birth control/protection: Condom    Comment: given condoms  Other Topics Concern  . Not on file  Social History Narrative  . Not on file   Social Determinants of Health   Financial Resource Strain: Not on file  Food Insecurity: Not on file  Transportation Needs: Not on file  Physical Activity: Not on file  Stress: Not on file  Social Connections: Not on file    Labs: Lab Results  Component Value Date   HIV1RNAQUANT 30,500 (H) 03/27/2020   HIV1RNAQUANT <20 DETECTED (A) 07/31/2019   HIV1RNAQUANT <20 DETECTED (A) 05/29/2019   CD4TABS 720 03/27/2020   CD4TABS 737 07/31/2019   CD4TABS 760 04/27/2018    RPR and STI Lab Results  Component Value Date   LABRPR REACTIVE (A) 03/27/2020   LABRPR NON-REACTIVE 05/29/2019   LABRPR NON-REACTIVE 04/27/2018   LABRPR Reactive (A) 05/24/2017   LABRPR NON REAC 11/05/2016   RPRTITER 1:1 (H) 03/27/2020  STI Results GC CT  04/11/2020 Negative Negative  04/11/2020 Negative Negative  04/11/2020 Negative Negative  07/31/2019 Negative Negative  07/31/2019 Negative Negative  07/31/2019 Negative Negative  05/29/2019 Negative Negative  04/27/2018 Negative Negative  05/24/2017 Negative Negative  10/19/2016 Negative **POSITIVE**(A)  10/19/2016 Negative Negative  10/19/2016 Negative Negative  10/19/2016 Negative **POSITIVE**(A)  05/28/2015 Negative Negative  04/12/2015 **POSITIVE**(A) Negative  02/17/2015 **POSITIVE**(A) Negative  10/04/2014 NG: Negative CT: Negative  04/30/2007 - POSITIVE (NOTE)  Testing performed using the BD Probetec ET  Chlamydia trachomatis and Neisseria gonorrhea amplified DNA assay.(A)    Hepatitis B Lab Results  Component Value Date   HEPBSAB EQUIVOCAL (A) 02/07/2013   HEPBSAG Negative 03/28/2015   HEPBCAB NEG 02/07/2013   Hepatitis C No results found for: HEPCAB, HCVRNAPCRQN Hepatitis A Lab Results  Component Value Date   HAV NEG 02/07/2013   Lipids: Lab Results  Component Value Date   CHOL 202 (H) 07/31/2019   TRIG 228 (H) 07/31/2019   HDL 38 (L) 07/31/2019   CHOLHDL 5.3 (H) 07/31/2019   VLDL 35 10/04/2014   LDLCALC 127 (H) 07/31/2019    Current HIV Regimen: Genvoya  Assessment: Clifford Shields is here today to follow up for his HIV infection after several missed appointments with Dr. Luciana Axe. He was last seen on 04/11/20. Last HIV labs include a viral load of 30,500 in July 2021 and a CD4 of 727.   He comes in today and states that he has had a stock pile of his [name] that he has been taking on and off since he was last seen. He has been completely out for 2 weeks. He states that he usually takes it consistently, but I am not sure how that is possible since he hasn't had a prescription sent for a long time. His [name] has also been expired since March 2022. He is also requesting a refill of his blood pressure medications. He has rotated taking his lisinopril and amlodipine due to low stock. His PCP is not longer at the practice and he has not established a new one. His BP was 145/97 when I took it today.  He is very interested in Guinea injections. I explained the importance of coming to his appointments and the need to not miss any follow ups in order to stay on track with the injections. He wishes to speak to a provider about starting.  Dr. Luciana Axe had no availability that worked with his schedule until first of July, so I will schedule him with Dr. Daiva Eves to discuss next week since he has available openings. The patient was very persistent in speaking to someone about this.  He should  probably be switched to [facility] and prove that he is reliable to show up to his appointments and renew his [name] on time before starting [place], but I will defer to Dr. Daiva Eves to make final determination.  I will update his lab work today including resistance testing since his adherence to [name] has been spotty at best. I will refill his Genvoya and blood pressure medications but instructed him to initiate care with a new PCP as soon as possible.   Plan: - Restart Genvoya for now - Refill amlodipine and lisinopril - HIV RNA with reflex, CD4, RPR, CMET, CBC, urine cytology today - F/u with Dr. Daiva Eves Monday   Myiah Petkus L. Dock Baccam, PharmD, BCIDP, AAHIVP, CPP Clinical Pharmacist Practitioner Infectious Diseases Clinical Pharmacist [facility] 01/23/2021, 2:03 PM

## 2021-01-24 ENCOUNTER — Encounter: Payer: Self-pay | Admitting: Infectious Disease

## 2021-01-24 LAB — T-HELPER CELL (CD4) - (RCID CLINIC ONLY)
CD4 % Helper T Cell: 32 % — ABNORMAL LOW (ref 33–65)
CD4 T Cell Abs: 727 /uL (ref 400–1790)

## 2021-01-24 MED ORDER — AMLODIPINE BESYLATE 5 MG PO TABS: 5.0000 mg | ORAL_TABLET | Freq: Every day | ORAL | 0 refills | Status: DC

## 2021-01-27 ENCOUNTER — Ambulatory Visit: Payer: Self-pay | Admitting: Infectious Disease

## 2021-01-27 LAB — COMPREHENSIVE METABOLIC PANEL
AG Ratio: 1.2 (calc) (ref 1.0–2.5)
ALT: 21 U/L (ref 9–46)
AST: 21 U/L (ref 10–40)
Albumin: 4.1 g/dL (ref 3.6–5.1)
Alkaline phosphatase (APISO): 70 U/L (ref 36–130)
BUN: 11 mg/dL (ref 7–25)
CO2: 28 mmol/L (ref 20–32)
Calcium: 9 mg/dL (ref 8.6–10.3)
Chloride: 107 mmol/L (ref 98–110)
Creat: 1.21 mg/dL (ref 0.60–1.35)
Globulin: 3.4 g/dL (calc) (ref 1.9–3.7)
Glucose, Bld: 89 mg/dL (ref 65–99)
Potassium: 4.3 mmol/L (ref 3.5–5.3)
Sodium: 141 mmol/L (ref 135–146)
Total Bilirubin: 0.3 mg/dL (ref 0.2–1.2)
Total Protein: 7.5 g/dL (ref 6.1–8.1)

## 2021-01-27 LAB — CBC
HCT: 40 % (ref 38.5–50.0)
Hemoglobin: 13.1 g/dL — ABNORMAL LOW (ref 13.2–17.1)
MCH: 27.1 pg (ref 27.0–33.0)
MCHC: 32.8 g/dL (ref 32.0–36.0)
MCV: 82.8 fL (ref 80.0–100.0)
MPV: 11.4 fL (ref 7.5–12.5)
Platelets: 220 10*3/uL (ref 140–400)
RBC: 4.83 10*6/uL (ref 4.20–5.80)
RDW: 13.6 % (ref 11.0–15.0)
WBC: 4.8 10*3/uL (ref 3.8–10.8)

## 2021-01-27 LAB — HIV RNA, RTPCR W/R GT (RTI, PI,INT)
HIV 1 RNA Quant: 20 copies/mL — ABNORMAL HIGH
HIV-1 RNA Quant, Log: 1.3 Log copies/mL — ABNORMAL HIGH

## 2021-01-27 LAB — RPR: RPR Ser Ql: NONREACTIVE

## 2021-01-29 ENCOUNTER — Ambulatory Visit (INDEPENDENT_AMBULATORY_CARE_PROVIDER_SITE_OTHER): Payer: Self-pay | Admitting: Infectious Disease

## 2021-01-29 ENCOUNTER — Other Ambulatory Visit: Payer: Self-pay

## 2021-01-29 VITALS — BP 146/105 | HR 79 | Temp 98.1°F | Ht 70.0 in | Wt 212.0 lb

## 2021-01-29 DIAGNOSIS — B20 Human immunodeficiency virus [HIV] disease: Secondary | ICD-10-CM

## 2021-01-29 DIAGNOSIS — Z9119 Patient's noncompliance with other medical treatment and regimen: Secondary | ICD-10-CM

## 2021-01-29 DIAGNOSIS — I10 Essential (primary) hypertension: Secondary | ICD-10-CM

## 2021-01-29 DIAGNOSIS — Z91199 Patient's noncompliance with other medical treatment and regimen due to unspecified reason: Secondary | ICD-10-CM

## 2021-01-29 NOTE — Progress Notes (Signed)
Subjective:  Chief complaint: He is interested in being on Guinea   Patient ID: Clifford Shields, male    DOB: 04-Feb-1986, 35 y.o.   MRN: [id number]  HPI  35-year-old black man living with HIV over 20 years now who has had intermittent adherence to antiretroviral therapy and more lead to making his appointments and making sure that he renews his HIV medication assistance program.  He was viremic at over 30,000 in July 2021.  He recently saw Cassie on 12 May and has been off Genvoya for roughly a month.  Viral load was still only 20 copies despite being off medications for several weeks.  He expressed interest in being on Guinea.  He has still not started his Genvoya nor is he restarted his antihypertensives.  I do not think he would be a suitable candidate to put onto Guinea outside of a research study...but we DO have a research study here at [facility] that he might very well qualify for.  I am rechecking a viral load today and had a meet with Deirdre Evener.  Past Medical History:  Diagnosis Date  . HIV disease (HCC)   . Hypertension   . Marijuana use     Past Surgical History:  Procedure Laterality Date  . ORIF TIBIA PLATEAU Left 03/28/2015   Procedure: OPEN REDUCTION INTERNAL FIXATION (ORIF) LEFT TIBIAL PLATEAU FRACTURE;  Surgeon: Myrene Galas, MD;  Location: [facility] OR;  Service: Orthopedics;  Laterality: Left;    Family History  Problem Relation Age of Onset  . Kidney disease Mother   . Hypertension Mother   . Kidney disease Father   . Hypertension Father   . CVA Father 34      Social History   Socioeconomic History  . Marital status: Media planner    Spouse name: Not on file  . Number of children: 0  . Years of education: Not on file  . Highest education level: Not on file  Occupational History  . Not on file  Tobacco Use  . Smoking status: Current Every Day Smoker    Packs/day: 0.50    Years: 15.00    Pack years: 7.50    Types: Cigarettes  .  Smokeless tobacco: Never Used  Vaping Use  . Vaping Use: Never used  Substance and Sexual Activity  . Alcohol use: Yes    Alcohol/week: 0.0 standard drinks    Comment: rarely  . Drug use: Not Currently    Frequency: 14.0 times per week    Types: Marijuana  . Sexual activity: Yes    Partners: Male    Birth control/protection: Condom    Comment: given condoms  Other Topics Concern  . Not on file  Social History Narrative  . Not on file   Social Determinants of Health   Financial Resource Strain: Not on file  Food Insecurity: Not on file  Transportation Needs: Not on file  Physical Activity: Not on file  Stress: Not on file  Social Connections: Not on file    No Known Allergies   Current Outpatient Medications:  .  amLODipine (NORVASC) 5 MG tablet, Take 1 tablet (5 mg total) by mouth daily., Disp: 30 tablet, Rfl: 0 .  augmented betamethasone dipropionate (DIPROLENE-AF) 0.05 % ointment, APPLY OINTMENT TOPICALLY TWICE DAILY, Disp: , Rfl:  .  busPIRone (BUSPAR) 7.5 MG tablet, Take 1 tablet (7.5 mg total) by mouth 3 (three) times daily., Disp: 90 tablet, Rfl: 0 .  elvitegravir-cobicistat-emtricitabine-tenofovir (GENVOYA) 150-150-200-10 MG TABS tablet, Take  1 tablet by mouth daily., Disp: 30 tablet, Rfl: 3 .  lisinopril (ZESTRIL) 10 MG tablet, Take 1 tablet (10 mg total) by mouth daily., Disp: 30 tablet, Rfl: 0 .  amoxicillin (AMOXIL) 500 MG capsule, TAKE 1 CAPSULE BY MOUTH 3 TIMES DAILY FOR DENTAL INFECTION. TAKE UNTIL GONE (Patient not taking: Reported on 01/29/2021), Disp: 30 capsule, Rfl: 0   Review of Systems  Constitutional: Negative for activity change, appetite change, chills, diaphoresis, fatigue, fever and unexpected weight change.  HENT: Negative for congestion, rhinorrhea, sinus pressure, sneezing, sore throat and trouble swallowing.   Eyes: Negative for photophobia and visual disturbance.  Respiratory: Negative for cough, chest tightness, shortness of breath, wheezing  and stridor.   Cardiovascular: Negative for chest pain, palpitations and leg swelling.  Gastrointestinal: Negative for abdominal distention, abdominal pain, anal bleeding, blood in stool, constipation, diarrhea, nausea and vomiting.  Genitourinary: Negative for difficulty urinating, dysuria, flank pain and hematuria.  Musculoskeletal: Negative for arthralgias, back pain, gait problem, joint swelling and myalgias.  Skin: Negative for color change, pallor, rash and wound.  Neurological: Negative for dizziness, tremors, weakness and light-headedness.  Hematological: Negative for adenopathy. Does not bruise/bleed easily.  Psychiatric/Behavioral: Negative for agitation, behavioral problems, confusion, decreased concentration, dysphoric mood and sleep disturbance.       Objective:   Physical Exam Constitutional:      Appearance: He is well-developed.  HENT:     Head: Normocephalic and atraumatic.  Eyes:     Conjunctiva/sclera: Conjunctivae normal.  Cardiovascular:     Rate and Rhythm: Normal rate and regular rhythm.  Pulmonary:     Effort: Pulmonary effort is normal. No respiratory distress.     Breath sounds: No wheezing.  Abdominal:     General: There is no distension.     Palpations: Abdomen is soft.  Musculoskeletal:        General: No tenderness. Normal range of motion.     Cervical back: Normal range of motion and neck supple.  Skin:    General: Skin is warm and dry.     Coloration: Skin is not pale.     Findings: No erythema or rash.  Neurological:     General: No focal deficit present.     Mental Status: He is alert and oriented to person, place, and time.  Psychiatric:        Mood and Affect: Mood normal.        Behavior: Behavior normal.        Thought Content: Thought content normal.        Judgment: Judgment normal.           Assessment & Plan:  HIV disease: check VL today and likely will need another one checked in a few weeks to see if viremic and viremic  for genotype to be taken though I have seen at take with lower viral loads using the [employer] assay.  I think he would be a good candidate to enroll into [facility] and this far from a pathway to long-acting antiretroviral therapy though I do not think he would get through conventional means.  Hypertension he does need to resume his home medications for this.  I spent greater than 40 minutes with the patient including greater than 50% of time in face to face counsel of the patient reviewing his records, explaining nature of [facility] study nature of Cabenuva including the small % and severe logical failure in patients who are adherent to their medications  (he was accepting  of this risk) and in coordination of his  care.

## 2021-02-18 ENCOUNTER — Encounter (INDEPENDENT_AMBULATORY_CARE_PROVIDER_SITE_OTHER): Payer: Self-pay | Admitting: *Deleted

## 2021-02-18 ENCOUNTER — Other Ambulatory Visit: Payer: Self-pay

## 2021-02-18 ENCOUNTER — Encounter: Payer: Self-pay | Admitting: *Deleted

## 2021-02-18 VITALS — BP 157/117 | HR 75 | Temp 98.9°F | Ht 70.5 in | Wt 215.4 lb

## 2021-02-18 DIAGNOSIS — Z006 Encounter for examination for normal comparison and control in clinical research program: Secondary | ICD-10-CM

## 2021-02-18 NOTE — Progress Notes (Signed)
Pt is a 35 yo AA male with HIV-1, non adherent to ART screening for A5359 [place] study. Long Acting Therapy to Improve Treatment Success in Daily Life a Phase III study to Evaluate Long Acting Antiretroviral Therapy in Non adherent HIV-infected Individuals. He is currently taking Genvoya and states he is not adherent due to swing schedule with working nights.  We discussed setting an alarm on his cell phone at 5 pm to take all prescribed medication.   He states he was told he has had HTN since age 58.  "It has never been controlled" He has not taken lisinopril 10 mg since 29 Jan 2021.  Also prescribed amlodipine 5 mg but has not taken.  He is a smoker. He denies cardiac symtpoms.  No previous hx of cardiac events. + family history of HTN. Reviewed VS today and discussed goal of < 140/90. He will return 21 Feb 2021 0800 for repeat BP-eligibility is pending that repeat. He understands the risks involved with uncontrolled HTN and agrees to call 911 if symptoms develop without delay.  CPE completed today in source-all systems unremarkable on exam.

## 2021-02-18 NOTE — Research (Addendum)
[facility]) was here today to screen for the [phone] study called [facility].  He had taken the consent home with him last week and reviewed prior to the visit. Informed consent was obtained after we discussed the study and answered all his questions. He was able to verbalize the purpose of the study. He has been having trouble with taking his meds regularly mainly due to hectic work schedule and his viral load has been up the past year or two. He says he is now committed to taking care of himself and getting back on track with his medications. He also has a history of HBP since childhood and BP today was 167/117 after resting for 5 min. He denies any associated symptoms at this time and only complaint is toothache lower rt jaw. He said he had not taken his lisinopril since sometime in May and never started the amlodipine that was prescribed in addition on May 13th. He has both of them at home and promises to start both today as well as his Genvoya.  He says that the BP meds make him feel tired but we explained that occurrs only initially and he should get used to it. He has a wisdom tooth that needs to be pulled and dental surgery is planned for Monday and he needs his BP down before that can be done. He is returning here on Friday to recheck his BP before planned dental surgery on Monday. And Entry has been planned in 2 weeks as long as he is eligible.

## 2021-02-19 LAB — URINALYSIS, ROUTINE W REFLEX MICROSCOPIC
Bacteria, UA: NONE SEEN /HPF
Bilirubin Urine: NEGATIVE
Glucose, UA: NEGATIVE
Hgb urine dipstick: NEGATIVE
Ketones, ur: NEGATIVE
Leukocytes,Ua: NEGATIVE
Nitrite: NEGATIVE
RBC / HPF: NONE SEEN /HPF (ref 0–2)
Specific Gravity, Urine: 1.025 (ref 1.001–1.035)
Squamous Epithelial / HPF: NONE SEEN /HPF (ref <=5)
WBC, UA: NONE SEEN /HPF (ref 0–5)
pH: 5.5 (ref 5.0–8.0)

## 2021-02-19 LAB — CBC AND DIFFERENTIAL
HCT: 38 — AB (ref 41–53)
Hemoglobin: 12.4 — AB (ref 13.5–17.5)
Neutrophils Absolute: 2.4
Platelets: 226 (ref 150–399)
WBC: 5.7

## 2021-02-19 LAB — CD4/CD8 (T-HELPER/T-SUPPRESSOR CELL)
CD4 T Cell Abs: 648
CD4%: 24
CD8 % Suppressor T Cell: 60.1
CD8 T Cell Abs: 1623

## 2021-02-19 LAB — CBC: RBC: 4.71 (ref 3.87–5.11)

## 2021-02-19 LAB — MICROSCOPIC MESSAGE

## 2021-02-20 LAB — HEPATITIS B CORE ANTIBODY, TOTAL: Hep B Core Total Ab: NONREACTIVE

## 2021-02-20 LAB — COMPREHENSIVE METABOLIC PANEL
AG Ratio: 1.2 (calc) (ref 1.0–2.5)
ALT: 24 U/L (ref 9–46)
AST: 21 U/L (ref 10–40)
Albumin: 4.3 g/dL (ref 3.6–5.1)
Alkaline phosphatase (APISO): 70 U/L (ref 36–130)
BUN: 13 mg/dL (ref 7–25)
CO2: 28 mmol/L (ref 20–32)
Calcium: 9.4 mg/dL (ref 8.6–10.3)
Chloride: 107 mmol/L (ref 98–110)
Creat: 1.17 mg/dL (ref 0.60–1.35)
Globulin: 3.7 g/dL (calc) (ref 1.9–3.7)
Glucose, Bld: 78 mg/dL (ref 65–99)
Potassium: 3.9 mmol/L (ref 3.5–5.3)
Sodium: 141 mmol/L (ref 135–146)
Total Bilirubin: 0.4 mg/dL (ref 0.2–1.2)
Total Protein: 8 g/dL (ref 6.1–8.1)

## 2021-02-20 LAB — HIV-1 RNA QUANT-NO REFLEX-BLD
HIV 1 RNA Quant: 26000 Copies/mL — ABNORMAL HIGH
HIV-1 RNA Quant, Log: 4.41 Log cps/mL — ABNORMAL HIGH

## 2021-02-20 LAB — HEPATITIS C ANTIBODY
Hepatitis C Ab: NONREACTIVE
SIGNAL TO CUT-OFF: 0.04 (ref <1.00)

## 2021-02-20 LAB — HIV-1 INTEGRASE GENOTYPE

## 2021-02-20 LAB — HIV-1 GENOTYPE: HIV-1 Genotype: DETECTED — AB

## 2021-02-20 LAB — HIV RNA, RTPCR W/R GT (RTI, PI,INT)
HIV 1 RNA Quant: 592 copies/mL — ABNORMAL HIGH
HIV-1 RNA Quant, Log: 2.77 Log copies/mL — ABNORMAL HIGH

## 2021-02-20 LAB — BILIRUBIN, DIRECT: Bilirubin, Direct: 0.1 mg/dL (ref 0.0–0.2)

## 2021-02-20 LAB — HEPATITIS B SURFACE ANTIGEN: Hepatitis B Surface Ag: NONREACTIVE

## 2021-02-20 LAB — HEPATITIS B SURFACE ANTIBODY,QUALITATIVE: Hep B S Ab: BORDERLINE — AB

## 2021-02-21 ENCOUNTER — Other Ambulatory Visit: Payer: Self-pay

## 2021-02-21 ENCOUNTER — Encounter: Payer: Self-pay | Admitting: *Deleted

## 2021-02-21 VITALS — BP 141/98 | HR 63

## 2021-02-21 DIAGNOSIS — I10 Essential (primary) hypertension: Secondary | ICD-10-CM

## 2021-02-21 MED ORDER — AMLODIPINE BESYLATE 10 MG PO TABS
10.0000 mg | ORAL_TABLET | Freq: Every day | ORAL | 11 refills | Status: DC
Start: 2021-02-21 — End: 2022-12-09

## 2021-02-21 NOTE — Research (Signed)
Shamar returned today to recheck his BP after resuming his BP meds on Tuesday. Bp is now down to 141/98 at the lowest. His BP on Tuesday was 162/117 and he is wanting to get dental surgery to remove his wisdom teeth. Spoke to Dr. Daiva Eves today and he said to increase his amlodipine to 10mg  /day from the 5mg . Compton reports feeling tired but he just came off working all night. He is due to come back Monday the 13th for dental and the following week for entry for study

## 2021-02-24 ENCOUNTER — Other Ambulatory Visit: Payer: Self-pay | Admitting: Family

## 2021-02-24 DIAGNOSIS — B20 Human immunodeficiency virus [HIV] disease: Secondary | ICD-10-CM

## 2021-02-26 ENCOUNTER — Other Ambulatory Visit: Payer: Self-pay | Admitting: Infectious Diseases

## 2021-02-26 DIAGNOSIS — B20 Human immunodeficiency virus [HIV] disease: Secondary | ICD-10-CM

## 2021-02-27 ENCOUNTER — Other Ambulatory Visit: Payer: Self-pay

## 2021-02-27 ENCOUNTER — Ambulatory Visit (INDEPENDENT_AMBULATORY_CARE_PROVIDER_SITE_OTHER): Payer: Self-pay | Admitting: Infectious Disease

## 2021-02-27 ENCOUNTER — Encounter: Payer: Self-pay | Admitting: Infectious Disease

## 2021-02-27 VITALS — BP 135/91 | HR 73 | Wt 213.4 lb

## 2021-02-27 DIAGNOSIS — I10 Essential (primary) hypertension: Secondary | ICD-10-CM

## 2021-02-27 DIAGNOSIS — B20 Human immunodeficiency virus [HIV] disease: Secondary | ICD-10-CM

## 2021-02-27 DIAGNOSIS — K089 Disorder of teeth and supporting structures, unspecified: Secondary | ICD-10-CM

## 2021-02-27 HISTORY — DX: Disorder of teeth and supporting structures, unspecified: K08.9

## 2021-02-27 MED ORDER — LISINOPRIL 20 MG PO TABS: 20.0000 mg | ORAL_TABLET | Freq: Every day | ORAL | 11 refills | Status: DC

## 2021-02-27 NOTE — Progress Notes (Signed)
Subjective:  Chief complaint: He is still having dental pain but the dentists have been refusing to operate on him until his diastolic blood pressure is below 90 points.   Patient ID: Clifford Shields, male    DOB: 27-Sep-1985, 35 y.o.   MRN: [id number]  HPI  89 -year-old black man living with HIV over 20 years now who has had intermittent adherence to antiretroviral therapy and more lead to making his appointments and making sure that he renews his HIV medication assistance program.  He was viremic at over 30,000 in July 2021.  He recently saw Cassie on 12 May and has been off Genvoya for roughly a month.  Viral load was still only 20 copies despite being off medications for several weeks.  He expressed interest in being on [place].  He had not started his Genvoya nor is he restarted his antihypertensives.  I do not think he would be a suitable candidate to put onto [place] outside of a research study...but we DO have a research study here at [facility] and he has since been screened and meet screening criteria for entry into [facility].  His enrollment visit is next week on Tuesday with Kim.  His blood pressure was elevated when he tried to go get his teeth worked on and dentistry refused to operate until the blood pressure was better controlled we doubled his dose of amlodipine to 10 mg but even on this his blood pressure was not sufficiently low for them to operate.   Past Medical History:  Diagnosis Date   HIV disease (HCC)    Hypertension    Marijuana use     Past Surgical History:  Procedure Laterality Date   ORIF TIBIA PLATEAU Left 03/28/2015   Procedure: OPEN REDUCTION INTERNAL FIXATION (ORIF) LEFT TIBIAL PLATEAU FRACTURE;  Surgeon: Myrene Galas, MD;  Location: [facility] OR;  Service: Orthopedics;  Laterality: Left;    Family History  Problem Relation Age of Onset   Kidney disease Mother    Hypertension Mother    Kidney disease Father    Hypertension Father    CVA Father  64      Social History   Socioeconomic History   Marital status: Media planner    Spouse name: Not on file   Number of children: 0   Years of education: Not on file   Highest education level: Not on file  Occupational History   Not on file  Tobacco Use   Smoking status: Every Day    Packs/day: 0.50    Years: 15.00    Pack years: 7.50    Types: Cigarettes   Smokeless tobacco: Never  Vaping Use   Vaping Use: Never used  Substance and Sexual Activity   Alcohol use: Yes    Alcohol/week: 0.0 standard drinks    Comment: rarely   Drug use: Not Currently    Frequency: 14.0 times per week    Types: Marijuana   Sexual activity: Yes    Partners: Male    Birth control/protection: Condom    Comment: given condoms  Other Topics Concern   Not on file  Social History Narrative   Not on file   Social Determinants of Health   Financial Resource Strain: Not on file  Food Insecurity: Not on file  Transportation Needs: Not on file  Physical Activity: Not on file  Stress: Not on file  Social Connections: Not on file    No Known Allergies   Current Outpatient Medications:  amLODipine (NORVASC) 10 MG tablet, Take 1 tablet (10 mg total) by mouth daily., Disp: 30 tablet, Rfl: 11   amoxicillin (AMOXIL) 500 MG capsule, TAKE 1 CAPSULE BY MOUTH 3 TIMES DAILY FOR DENTAL INFECTION. TAKE UNTIL GONE (Patient not taking: Reported on 01/29/2021), Disp: 30 capsule, Rfl: 0   augmented betamethasone dipropionate (DIPROLENE-AF) 0.05 % ointment, APPLY OINTMENT TOPICALLY TWICE DAILY, Disp: , Rfl:    busPIRone (BUSPAR) 7.5 MG tablet, Take 1 tablet (7.5 mg total) by mouth 3 (three) times daily., Disp: 90 tablet, Rfl: 0   elvitegravir-cobicistat-emtricitabine-tenofovir (GENVOYA) 150-150-200-10 MG TABS tablet, Take 1 tablet by mouth daily., Disp: 30 tablet, Rfl: 3   lisinopril (ZESTRIL) 10 MG tablet, Take 1 tablet (10 mg total) by mouth daily., Disp: 30 tablet, Rfl: 0   Review of Systems   Constitutional:  Negative for activity change, appetite change, chills, diaphoresis, fatigue, fever and unexpected weight change.  HENT:  Positive for dental problem. Negative for congestion, rhinorrhea, sinus pressure, sneezing, sore throat and trouble swallowing.   Eyes:  Negative for photophobia and visual disturbance.  Respiratory:  Negative for cough, chest tightness, shortness of breath, wheezing and stridor.   Cardiovascular:  Negative for chest pain, palpitations and leg swelling.  Gastrointestinal:  Negative for abdominal distention, abdominal pain, anal bleeding, blood in stool, constipation, diarrhea, nausea and vomiting.  Genitourinary:  Negative for difficulty urinating, dysuria, flank pain and hematuria.  Musculoskeletal:  Negative for arthralgias, back pain, gait problem, joint swelling and myalgias.  Skin:  Negative for color change, pallor, rash and wound.  Neurological:  Negative for dizziness, tremors, weakness and light-headedness.  Hematological:  Negative for adenopathy. Does not bruise/bleed easily.  Psychiatric/Behavioral:  Negative for agitation, behavioral problems, confusion, decreased concentration, dysphoric mood and sleep disturbance.       Objective:   Physical Exam Constitutional:      Appearance: He is well-developed.  HENT:     Head: Normocephalic and atraumatic.  Eyes:     Conjunctiva/sclera: Conjunctivae normal.  Cardiovascular:     Rate and Rhythm: Normal rate and regular rhythm.  Pulmonary:     Effort: Pulmonary effort is normal. No respiratory distress.     Breath sounds: No wheezing.  Abdominal:     General: There is no distension.     Palpations: Abdomen is soft.  Musculoskeletal:        General: No tenderness. Normal range of motion.     Cervical back: Normal range of motion and neck supple.  Skin:    General: Skin is warm and dry.     Coloration: Skin is not pale.     Findings: No erythema or rash.  Neurological:     General: No focal  deficit present.     Mental Status: He is alert and oriented to person, place, and time.  Psychiatric:        Mood and Affect: Mood normal.        Behavior: Behavior normal.        Thought Content: Thought content normal.        Judgment: Judgment normal.          Assessment & Plan:   Hypertension: We will increase his lisinopril to 20 mg.  He is to continue his amlodipine 10 mg Dr. Dareen Piano who saw the patient with me from family medicine has scheduled him to follow-up with her next week at which point in time she will reassess his blood pressure and check a metabolic panel. She and  her colleagues will work to optimize his blood pressure to get it to goal.      HIV disease:  to enroll into [facility] and he wishes to stay on Genvoya in the initial part of the study  Dental problems: hopefully the dentist are sufficiently happy with his blood pressure.  I worry that some of the hypertension may be driven by pain and since we do not prescribe opiates in the clinic this could make it more difficult for him to achieve the blood pressure they wish.   I spent more than 40  minutes with the patient including greater than 50% of time in face to face counseling of the patient personally reviewing radiographs, long with pertinent laboratory microbiological data review of medical records and in coordination of his care.

## 2021-03-03 NOTE — Progress Notes (Signed)
Patient was unfortunately no-show to his 03/04/2021 appointment to follow-up for his high blood pressure.  Patient can reschedule this as he would like to.  Peggyann Shoals, DO [facility], PGY-3 03/04/2021 2:22 PM

## 2021-03-04 ENCOUNTER — Encounter: Payer: Self-pay | Admitting: *Deleted

## 2021-03-04 ENCOUNTER — Ambulatory Visit (INDEPENDENT_AMBULATORY_CARE_PROVIDER_SITE_OTHER): Payer: Self-pay | Admitting: Family Medicine

## 2021-03-04 DIAGNOSIS — Z5329 Procedure and treatment not carried out because of patient's decision for other reasons: Secondary | ICD-10-CM

## 2021-03-04 DIAGNOSIS — I10 Essential (primary) hypertension: Secondary | ICD-10-CM

## 2021-03-04 DIAGNOSIS — Z91199 Patient's noncompliance with other medical treatment and regimen due to unspecified reason: Secondary | ICD-10-CM | POA: Insufficient documentation

## 2021-03-11 ENCOUNTER — Encounter (INDEPENDENT_AMBULATORY_CARE_PROVIDER_SITE_OTHER): Payer: Self-pay | Admitting: *Deleted

## 2021-03-11 ENCOUNTER — Other Ambulatory Visit: Payer: Self-pay

## 2021-03-11 VITALS — BP 133/91 | HR 87 | Temp 98.1°F | Wt 210.1 lb

## 2021-03-11 DIAGNOSIS — Z006 Encounter for examination for normal comparison and control in clinical research program: Secondary | ICD-10-CM

## 2021-03-11 NOTE — Research (Signed)
[facility]ord Shields was enrolled into the [facility] study after eligibility was confirmed today. He preferred to stay on his own regimen of Genvoya since it doesn't cause him any side effects.  He did not bring his pill bottle with him so we could not do a pill count, but he will at the next visit at week 2. He denies any new problems. His BP is better at 133/91 with the increase dose of his BP meds. He will reach out to dental clinic again and see if they will schedule him for dental extraction soon. He said he has been trying to take his genvoya everyday since he came back in to see Dr. Daiva Eves.  Importance of adherence was discussed with him to get suppressed by week 12.  He did mention concern about Truvada use and the problem with fractures. He did have a leg fracture while on tenofovir in 2016. We discussed starting calcium and Vitamin D supplementation since he doesn't tolerate dairy products very well.

## 2021-03-13 LAB — CD4/CD8 (T-HELPER/T-SUPPRESSOR CELL)
CD4 T Cell Abs: 553
CD4%: 30.7
CD8 % Suppressor T Cell: 50
CD8 T Cell Abs: 900

## 2021-03-13 LAB — CBC AND DIFFERENTIAL
HCT: 41 (ref 41–53)
Hemoglobin: 13.1 — AB (ref 13.5–17.5)
Neutrophils Absolute: 1.5
Platelets: 286 (ref 150–399)
WBC: 3.8

## 2021-03-13 LAB — CBC: RBC: 4.95 (ref 3.87–5.11)

## 2021-03-14 LAB — HEPATIC FUNCTION PANEL
AG Ratio: 1 (calc) (ref 1.0–2.5)
ALT: 19 U/L (ref 9–46)
AST: 22 U/L (ref 10–40)
Albumin: 4.3 g/dL (ref 3.6–5.1)
Alkaline phosphatase (APISO): 75 U/L (ref 36–130)
Bilirubin, Direct: 0.1 mg/dL (ref 0.0–0.2)
Globulin: 4.1 g/dL (calc) — ABNORMAL HIGH (ref 1.9–3.7)
Indirect Bilirubin: 0.3 mg/dL (calc) (ref 0.2–1.2)
Total Bilirubin: 0.4 mg/dL (ref 0.2–1.2)
Total Protein: 8.4 g/dL — ABNORMAL HIGH (ref 6.1–8.1)

## 2021-03-14 LAB — LIPID PANEL
Cholesterol: 178 mg/dL (ref <200)
HDL: 29 mg/dL — ABNORMAL LOW (ref >=40)
LDL Cholesterol (Calc): 115 mg/dL (calc) — ABNORMAL HIGH
Non-HDL Cholesterol (Calc): 149 mg/dL (calc) — ABNORMAL HIGH (ref <130)
Total CHOL/HDL Ratio: 6.1 (calc) — ABNORMAL HIGH (ref <5.0)
Triglycerides: 220 mg/dL — ABNORMAL HIGH (ref <150)

## 2021-03-14 LAB — HIV-1 RNA QUANT-NO REFLEX-BLD
HIV 1 RNA Quant: <20 Copies/mL — ABNORMAL HIGH
HIV-1 RNA Quant, Log: <1.3 Log cps/mL — ABNORMAL HIGH

## 2021-03-25 ENCOUNTER — Other Ambulatory Visit: Payer: Self-pay

## 2021-03-25 ENCOUNTER — Encounter: Payer: Self-pay | Admitting: *Deleted

## 2021-03-25 VITALS — BP 146/86 | HR 73 | Temp 98.4°F | Wt 212.4 lb

## 2021-03-25 DIAGNOSIS — Z006 Encounter for examination for normal comparison and control in clinical research program: Secondary | ICD-10-CM

## 2021-03-25 NOTE — Research (Signed)
Clifford Shields was here for his week 2 visit for [phone]. He said he only missed 1 tablet of Symtuza over the past 2 weeks due to not being at home to take it. He denies any new problems or medications and is still waiting for dental to schedule him in for extraction. His BP is much better now. He will be returning in 2 weeks again.

## 2021-04-08 ENCOUNTER — Encounter (INDEPENDENT_AMBULATORY_CARE_PROVIDER_SITE_OTHER): Payer: Self-pay | Admitting: *Deleted

## 2021-04-08 ENCOUNTER — Other Ambulatory Visit: Payer: Self-pay

## 2021-04-08 VITALS — BP 132/89 | HR 76 | Temp 98.3°F | Wt 212.1 lb

## 2021-04-08 DIAGNOSIS — Z006 Encounter for examination for normal comparison and control in clinical research program: Secondary | ICD-10-CM

## 2021-04-08 NOTE — Research (Signed)
Clifford Shields was here today for week 4of the A5359 study. He said he only missed one dose of his ARV over the past 2 weeks. He did go get tested at [facility]  due to feeling like something was wrong and said he had some thing urethral but wasn't sure exactly and was given doxycycline. I will request records from them. He is scheduled to get his tooth pulled next week here in the dental clinic since his BP seems to be doing much better. He will be returning in 4 weeks for the next appt.

## 2021-04-11 LAB — CBC WITH DIFFERENTIAL/PLATELET
Absolute Monocytes: 402 cells/uL (ref 200–950)
Basophils Absolute: 22 cells/uL (ref 0–200)
Basophils Relative: 0.4 %
Eosinophils Absolute: 231 cells/uL (ref 15–500)
Eosinophils Relative: 4.2 %
HCT: 40.4 % (ref 38.5–50.0)
Hemoglobin: 12.8 g/dL — ABNORMAL LOW (ref 13.2–17.1)
Lymphs Abs: 3014 cells/uL (ref 850–3900)
MCH: 26.3 pg — ABNORMAL LOW (ref 27.0–33.0)
MCHC: 31.7 g/dL — ABNORMAL LOW (ref 32.0–36.0)
MCV: 83 fL (ref 80.0–100.0)
MPV: 11.1 fL (ref 7.5–12.5)
Monocytes Relative: 7.3 %
Neutro Abs: 1832 cells/uL (ref 1500–7800)
Neutrophils Relative %: 33.3 %
Platelets: 253 10*3/uL (ref 140–400)
RBC: 4.87 10*6/uL (ref 4.20–5.80)
RDW: 13.4 % (ref 11.0–15.0)
Total Lymphocyte: 54.8 %
WBC: 5.5 10*3/uL (ref 3.8–10.8)

## 2021-04-11 LAB — COMPREHENSIVE METABOLIC PANEL
AG Ratio: 1.1 (calc) (ref 1.0–2.5)
ALT: 23 U/L (ref 9–46)
AST: 23 U/L (ref 10–40)
Albumin: 4.2 g/dL (ref 3.6–5.1)
Alkaline phosphatase (APISO): 76 U/L (ref 36–130)
BUN: 10 mg/dL (ref 7–25)
CO2: 28 mmol/L (ref 20–32)
Calcium: 9.5 mg/dL (ref 8.6–10.3)
Chloride: 106 mmol/L (ref 98–110)
Creat: 1.25 mg/dL (ref 0.60–1.26)
Globulin: 4 g/dL (calc) — ABNORMAL HIGH (ref 1.9–3.7)
Glucose, Bld: 84 mg/dL (ref 65–99)
Potassium: 4.1 mmol/L (ref 3.5–5.3)
Sodium: 141 mmol/L (ref 135–146)
Total Bilirubin: 0.4 mg/dL (ref 0.2–1.2)
Total Protein: 8.2 g/dL — ABNORMAL HIGH (ref 6.1–8.1)

## 2021-04-11 LAB — HIV-1 RNA QUANT-NO REFLEX-BLD
HIV 1 RNA Quant: <20 Copies/mL — ABNORMAL HIGH
HIV-1 RNA Quant, Log: <1.3 Log cps/mL — ABNORMAL HIGH

## 2021-04-11 LAB — BILIRUBIN, DIRECT: Bilirubin, Direct: 0.1 mg/dL (ref 0.0–0.2)

## 2021-05-06 ENCOUNTER — Telehealth: Payer: Self-pay

## 2021-05-06 ENCOUNTER — Encounter (INDEPENDENT_AMBULATORY_CARE_PROVIDER_SITE_OTHER): Payer: Self-pay | Admitting: *Deleted

## 2021-05-06 ENCOUNTER — Other Ambulatory Visit: Payer: Self-pay | Admitting: Internal Medicine

## 2021-05-06 ENCOUNTER — Other Ambulatory Visit: Payer: Self-pay

## 2021-05-06 VITALS — BP 152/84 | HR 71 | Temp 98.3°F | Wt 218.5 lb

## 2021-05-06 DIAGNOSIS — B20 Human immunodeficiency virus [HIV] disease: Secondary | ICD-10-CM

## 2021-05-06 DIAGNOSIS — Z006 Encounter for examination for normal comparison and control in clinical research program: Secondary | ICD-10-CM

## 2021-05-06 NOTE — Research (Signed)
Clifford Shields was here for his week 8 visit for A5359. He said he had not gotten his genvoya refilled until today and had taken the last few he had left with after the last visit. He said he had just been very busy between work and moving and hadn't thought about it, since he is rarely on this side of town. He did pick up his meds before the visit today and will resume them right away. We discussed the importance of adherence and getting his viral load down. He is aware that he probably won't be suppressed today, but hopefully will be by the next visit in a month. If he is then we can move him to step 2 of the study, where he has a 50/50 chance of moving to [place].

## 2021-05-06 NOTE — Telephone Encounter (Signed)
Patient called to report that he has no refills on Genvoya left. RN reviewed chart and saw research encounter stating the patient has not missed doses of Symtuza. RN forwarding to research team to confirm regimen patient is supposed to be on.   Zaniya Mcaulay Loyola Mast, RN

## 2021-05-07 ENCOUNTER — Other Ambulatory Visit: Payer: Self-pay | Admitting: Infectious Disease

## 2021-05-08 LAB — HIV-1 RNA QUANT-NO REFLEX-BLD
HIV 1 RNA Quant: 33 Copies/mL — ABNORMAL HIGH
HIV-1 RNA Quant, Log: 1.52 Log cps/mL — ABNORMAL HIGH

## 2021-05-13 ENCOUNTER — Ambulatory Visit: Payer: Self-pay | Admitting: Infectious Disease

## 2021-05-13 ENCOUNTER — Telehealth: Payer: Self-pay

## 2021-05-13 NOTE — Telephone Encounter (Signed)
Called patient to reschedule missed appointment, no answer and line kept ringing, unable to leave voicemail.   Sandie Ano, RN

## 2021-06-03 ENCOUNTER — Encounter: Payer: Self-pay | Admitting: *Deleted

## 2021-06-10 ENCOUNTER — Encounter: Payer: Self-pay | Admitting: *Deleted

## 2021-06-13 ENCOUNTER — Encounter: Payer: Self-pay | Admitting: *Deleted

## 2021-06-17 ENCOUNTER — Encounter: Payer: Self-pay | Admitting: *Deleted

## 2021-06-23 ENCOUNTER — Other Ambulatory Visit: Payer: Self-pay | Admitting: Pharmacist

## 2021-06-23 DIAGNOSIS — I10 Essential (primary) hypertension: Secondary | ICD-10-CM

## 2021-07-02 ENCOUNTER — Encounter: Payer: Self-pay | Admitting: *Deleted

## 2021-07-03 ENCOUNTER — Other Ambulatory Visit: Payer: Self-pay

## 2021-07-03 ENCOUNTER — Encounter (INDEPENDENT_AMBULATORY_CARE_PROVIDER_SITE_OTHER): Payer: Self-pay | Admitting: *Deleted

## 2021-07-03 ENCOUNTER — Telehealth: Payer: Self-pay | Admitting: Pharmacist

## 2021-07-03 VITALS — BP 147/103 | HR 76 | Temp 98.7°F | Wt 216.0 lb

## 2021-07-03 DIAGNOSIS — Z006 Encounter for examination for normal comparison and control in clinical research program: Secondary | ICD-10-CM

## 2021-07-03 NOTE — Research (Signed)
Clifford Shields came in for his week 16 visit for A5359. He missed his week 12 step 1 visit so we are obtaining the items that were missed at that visit. He says his schedule has been very hectic and works 2 jobs making it hard to keep visits on time.  He denies any problems but did state he stopped his amlodipine a while back because he didn't like the way it made him feel. His BP was 147/103 today and I stressed the importance of him resuming it to avoid other health problems in the future. He said he would start it again. He says his adherence is fair, again due to hectic schedules. If his Viral load is under 200 at this visit we will be able to advance him to step 2 where he has 50/50 chance of getting put on injectables which is what he wants. He said he received a covid booster at the [facility] in July this year.

## 2021-07-03 NOTE — Telephone Encounter (Signed)
Obtained a free 30 day access card for patient's Genvoya. Patient will take to [facility] to fill while [name] is being processed.         Muriel Wilber L. Nymir Ringler, PharmD, BCIDP, AAHIVP, CPP Clinical Pharmacist Practitioner Infectious Diseases Clinical Pharmacist [facility] 07/03/2021, 3:43 PM

## 2021-07-04 ENCOUNTER — Ambulatory Visit (HOSPITAL_COMMUNITY)
Admission: EM | Admit: 2021-07-04 | Discharge: 2021-07-04 | Disposition: A | Discharge status: Home / Self Care | Payer: Self-pay | Attending: Physician Assistant | Admitting: Physician Assistant

## 2021-07-04 ENCOUNTER — Other Ambulatory Visit: Payer: Self-pay

## 2021-07-04 ENCOUNTER — Encounter (HOSPITAL_COMMUNITY): Payer: Self-pay | Admitting: Emergency Medicine

## 2021-07-04 DIAGNOSIS — S30822A Blister (nonthermal) of penis, initial encounter: Secondary | ICD-10-CM | POA: Insufficient documentation

## 2021-07-04 DIAGNOSIS — N4889 Other specified disorders of penis: Secondary | ICD-10-CM | POA: Insufficient documentation

## 2021-07-04 DIAGNOSIS — Z711 Person with feared health complaint in whom no diagnosis is made: Secondary | ICD-10-CM | POA: Insufficient documentation

## 2021-07-04 DIAGNOSIS — I10 Essential (primary) hypertension: Secondary | ICD-10-CM | POA: Insufficient documentation

## 2021-07-04 LAB — HEPATITIS C ANTIBODY: HCV Ab: NONREACTIVE

## 2021-07-04 MED ORDER — HYDROCORTISONE 1 % EX CREA: TOPICAL_CREAM | CUTANEOUS | 0 refills | Status: DC

## 2021-07-04 NOTE — Discharge Instructions (Signed)
We will contact you with your lab results as we get them.  I would recommend setting up for MyChart he can monitor the lab results there.  Please abstain from sexual activity until results are obtained.  Use hydrocortisone cream to manage your symptoms.  We will arrange treatment based on your results.  If you have any worsening symptoms please return for reevaluation.

## 2021-07-04 NOTE — ED Provider Notes (Addendum)
[facility]    CSN: [id number] Arrival date & time: 07/04/21  1955      History   Chief Complaint Chief Complaint  Patient presents with   Exposure to STD    HPI Clifford Shields is a 35 y.o. male.   Patient presents today with a 1 day history of increasing penile discomfort and swelling.  He has developed several blisters in his genital region.  He denies episodes of similar symptoms in the past.  He is sexually active with male partners and does not consistently use condoms.  He does have a history of STI and states current symptoms are similar but not identical to previous diagnosis.  He did start taking doxycycline and reports that symptoms began soon after starting this medication.  He denies any rash anywhere else.  He denies any known exposure to monkey pox.  Denies prodromal symptoms including fever, nausea, vomiting, headache, malaise.  He denies any changes in personal hygiene products.  Denies history of herpes.  Reports symptoms are painful with pain rated 9 on a 0-10 pain scale, localized to penis/genitals, no aggravating or alleviating factors identified.   Past Medical History:  Diagnosis Date   HIV disease (HCC)    Hypertension    Marijuana use    Poor dentition 02/27/2021    Patient Active Problem List   Diagnosis Date Noted   No-show for appointment 03/04/2021   Poor dentition 02/27/2021   Routine screening for STI (sexually transmitted infection) 07/31/2019   Encounter for long-term (current) use of high-risk medication 07/31/2019   Rash and nonspecific skin eruption 11/05/2016   Inguinal adenopathy 05/28/2015   Testosterone deficiency 03/29/2015   Vitamin D deficiency 03/29/2015   Marijuana abuse 03/29/2015   Tibial plateau fracture 03/28/2015   Closed fracture of unspecified phalanx or phalanges of hand 04/04/2014   Tobacco abuse 02/22/2013   Essential hypertension, benign 12/24/2009   Human immunodeficiency virus (HIV) disease (HCC)  11/23/2006    Past Surgical History:  Procedure Laterality Date   ORIF TIBIA PLATEAU Left 03/28/2015   Procedure: OPEN REDUCTION INTERNAL FIXATION (ORIF) LEFT TIBIAL PLATEAU FRACTURE;  Surgeon: Myrene Galas, MD;  Location: [facility];  Service: Orthopedics;  Laterality: Left;       Home Medications    Prior to Admission medications   Medication Sig Start Date End Date Taking? Authorizing Provider  hydrocortisone cream 1 % Apply to affected area 2 times daily 07/04/21  Yes Lazette Estala K, PA-C  amLODipine (NORVASC) 10 MG tablet Take 1 tablet (10 mg total) by mouth daily. 02/21/21   [name], MD  elvitegravir-cobicistat-emtricitabine-tenofovir [facility]) 150-150-200-10 MG TABS tablet Take 1 tablet by mouth daily with breakfast. 05/06/21   Daiva Eves, Lisette Grinder, MD  lisinopril (ZESTRIL) 20 MG tablet Take 1 tablet (20 mg total) by mouth daily. 02/27/21   [name], MD    Family History Family History  Problem Relation Age of Onset   Kidney disease Mother    Hypertension Mother    Kidney disease Father    Hypertension Father    CVA Father 47    Social History Social History   Tobacco Use   Smoking status: Every Day    Packs/day: 0.50    Years: 15.00    Pack years: 7.50    Types: Cigarettes   Smokeless tobacco: Never  Vaping Use   Vaping Use: Never used  Substance Use Topics   Alcohol use: Yes    Alcohol/week: 0.0  standard drinks    Comment: rarely   Drug use: Not Currently    Frequency: 14.0 times per week    Types: Marijuana     Allergies   Patient has no known allergies.   Review of Systems Review of Systems  Constitutional:  Positive for activity change. Negative for appetite change, fatigue and fever.  Respiratory:  Negative for cough and shortness of breath.   Cardiovascular:  Negative for chest pain.  Gastrointestinal:  Negative for abdominal pain, diarrhea, nausea and vomiting.  Genitourinary:  Positive for genital sores, penile pain and  penile swelling. Negative for dysuria, flank pain, penile discharge and testicular pain.  Neurological:  Negative for dizziness, light-headedness and headaches.    Physical Exam Triage Vital Signs ED Triage Vitals  Enc Vitals Group     BP --      Pulse Rate 07/04/21 2006 82     Resp 07/04/21 2006 16     Temp 07/04/21 2006 98.3 F (36.8 C)     Temp Source 07/04/21 2006 Oral     SpO2 07/04/21 2006 100 %     Weight --      Height --      Head Circumference --      Peak Flow --      Pain Score 07/04/21 2007 9     Pain Loc --      Pain Edu? --      Excl. in GC? --    No data found.  Updated Vital Signs BP (!) 185/105 (BP Location: Right Arm)   Pulse 82   Temp 98.3 F (36.8 C) (Oral)   Resp 16   SpO2 100%   Visual Acuity Right Eye Distance:   Left Eye Distance:   Bilateral Distance:    Right Eye Near:   Left Eye Near:    Bilateral Near:     Physical Exam Vitals reviewed.  Constitutional:      General: He is awake.     Appearance: Normal appearance. He is well-developed. He is not ill-appearing.     Comments: Very pleasant male appears stated age in no acute distress sitting comfortably in exam room  HENT:     Head: Normocephalic and atraumatic.     Mouth/Throat:     Pharynx: No oropharyngeal exudate, posterior oropharyngeal erythema or uvula swelling.  Cardiovascular:     Rate and Rhythm: Normal rate and regular rhythm.     Heart sounds: Normal heart sounds, S1 normal and S2 normal. No murmur heard. Pulmonary:     Effort: Pulmonary effort is normal.     Breath sounds: Normal breath sounds. No stridor. No wheezing, rhonchi or rales.     Comments: Clear auscultation bilaterally Abdominal:     General: Bowel sounds are normal.     Palpations: Abdomen is soft.     Tenderness: There is no abdominal tenderness.  Genitourinary:    Penis: Circumcised. Tenderness, swelling and lesions present. No discharge.      Testes: Normal.     Comments: Swelling noted of penis  with several vesicular lesions on glans penis and scrotum.  No penile discharge noted. Neurological:     Mental Status: He is alert.  Psychiatric:        Behavior: Behavior is cooperative.     UC Treatments / Results  Labs (all labs ordered are listed, but only abnormal results are displayed) Labs Reviewed  HSV CULTURE AND TYPING  MONKEYPOX VIRUS DNA, QUALITATIVE REAL-TIME PCR  MONKEYPOX VIRUS DNA, QUALITATIVE REAL-TIME PCR  HEPATITIS C ANTIBODY  RPR  CYTOLOGY, (ORAL, ANAL, URETHRAL) ANCILLARY ONLY    EKG   Radiology No results found.  Procedures Procedures (including critical care time)  Medications Ordered in UC Medications - No data to display  Initial Impression / Assessment and Plan / UC Course  I have reviewed the triage vital signs and the nursing notes.  Pertinent labs & imaging results that were available during my care of the patient were reviewed by me and considered in my medical decision making (see chart for details).     Unclear etiology of symptoms.  Symptoms are consistent with herpes as a testing was obtained but patient is high risk for monkey pox so given vesicular lesions will obtain testing for this as well.  Standard STI testing collected today including gonorrhea, chlamydia, trichomonas, syphilis, hepatitis C.  Patient is already HIV positive and followed by infectious disease.  He can use hydrocortisone cream for symptomatic relief.  Recommended that he use hypoallergenic soaps and detergents.  We will make additional recommendations based on laboratory findings.  Discussed alarm symptoms that warrant emergent evaluation.  Strict return precautions given to which she expressed understanding.  Blood pressure was noted be elevated today.  Patient has a history of hypertension and is prescribed lisinopril and amlodipine.  Reports he medication as prescribed.  Believes that elevated blood pressure reading is due to pain and anxiety.  Denies any chest  pain, shortness of breath, headache, dizziness.  Recommended he monitor his blood pressure at home and keep log for evaluation of appointment.  Discussed that if he develops any worsening symptoms including chest pain, shortness of breath, headache, dizziness in setting of high blood pressure he needs to go to the emergency room.  Recommended follow-up with PCP to ensure normalization of blood pressure within a few weeks.  Final Clinical Impressions(s) / UC Diagnoses   Final diagnoses:  Penile pain  Blister (nonthermal) of penis, initial encounter  Concern about STD in male without diagnosis  Elevated blood pressure reading with diagnosis of hypertension     Discharge Instructions      We will contact you with your lab results as we get them.  I would recommend setting up for MyChart he can monitor the lab results there.  Please abstain from sexual activity until results are obtained.  Use hydrocortisone cream to manage your symptoms.  We will arrange treatment based on your results.  If you have any worsening symptoms please return for reevaluation.     ED Prescriptions     Medication Sig Dispense Auth. Provider   hydrocortisone cream 1 % Apply to affected area 2 times daily 15 g Claryssa Sandner K, PA-C      PDMP not reviewed this encounter.   Jeani Hawking, PA-C 07/04/21 2032    Jeani Hawking, PA-C 07/04/21 2033

## 2021-07-04 NOTE — ED Triage Notes (Signed)
Pt reports penile discomfort and swelling. Pt reports the last time this happened he was dx with an STI.

## 2021-07-05 LAB — RPR: RPR Ser Ql: NONREACTIVE

## 2021-07-07 LAB — COMPREHENSIVE METABOLIC PANEL
AG Ratio: 1.2 (calc) (ref 1.0–2.5)
ALT: 19 U/L (ref 9–46)
AST: 20 U/L (ref 10–40)
Albumin: 4.2 g/dL (ref 3.6–5.1)
Alkaline phosphatase (APISO): 70 U/L (ref 36–130)
BUN/Creatinine Ratio: 10 (calc) (ref 6–22)
BUN: 14 mg/dL (ref 7–25)
CO2: 28 mmol/L (ref 20–32)
Calcium: 9.4 mg/dL (ref 8.6–10.3)
Chloride: 106 mmol/L (ref 98–110)
Creat: 1.36 mg/dL — ABNORMAL HIGH (ref 0.60–1.26)
Globulin: 3.6 g/dL (calc) (ref 1.9–3.7)
Glucose, Bld: 110 mg/dL — ABNORMAL HIGH (ref 65–99)
Potassium: 4 mmol/L (ref 3.5–5.3)
Sodium: 140 mmol/L (ref 135–146)
Total Bilirubin: 0.3 mg/dL (ref 0.2–1.2)
Total Protein: 7.8 g/dL (ref 6.1–8.1)

## 2021-07-07 LAB — CYTOLOGY, (ORAL, ANAL, URETHRAL) ANCILLARY ONLY
Chlamydia: NEGATIVE
Comment: NEGATIVE
Comment: NEGATIVE
Comment: NORMAL
Neisseria Gonorrhea: NEGATIVE
Trichomonas: NEGATIVE

## 2021-07-07 LAB — MONKEYPOX VIRUS DNA, QUALITATIVE REAL-TIME PCR: Orthopoxvirus DNA, QL PCR: NOT DETECTED

## 2021-07-07 LAB — BILIRUBIN, DIRECT: Bilirubin, Direct: 0.1 mg/dL (ref 0.0–0.2)

## 2021-07-07 LAB — HIV-1 RNA QUANT-NO REFLEX-BLD
HIV 1 RNA Quant: NOT DETECTED Copies/mL
HIV-1 RNA Quant, Log: NOT DETECTED Log cps/mL

## 2021-07-08 LAB — HSV CULTURE AND TYPING

## 2021-07-11 ENCOUNTER — Encounter: Payer: Self-pay | Admitting: *Deleted

## 2021-07-11 LAB — CD4/CD8 (T-HELPER/T-SUPPRESSOR CELL)
CD4 % Helper T Cell: 31.4
CD4 Count: 659
CD8 % Suppressor T Cell: 46
CD8 T Cell Abs: 966

## 2021-07-17 ENCOUNTER — Encounter (INDEPENDENT_AMBULATORY_CARE_PROVIDER_SITE_OTHER): Payer: Self-pay | Admitting: *Deleted

## 2021-07-17 ENCOUNTER — Other Ambulatory Visit: Payer: Self-pay

## 2021-07-17 DIAGNOSIS — Z006 Encounter for examination for normal comparison and control in clinical research program: Secondary | ICD-10-CM

## 2021-07-17 MED ORDER — STUDY - A5359 (STEP 2) - RILPIVERINE 900MG/3ML INJECTION (PI-VAN DAM): 600.0000 mg | INJECTION | INTRAMUSCULAR | Status: DC

## 2021-07-17 MED ORDER — STUDY - A5359 (STEP 2) - CABOTEGRAVIR 600MG/3ML INJECTION (PI-VAN DAM): 400.0000 mg | INJECTION | INTRAMUSCULAR | Status: DC

## 2021-07-17 NOTE — Research (Signed)
Sydney was here today for his step 2 entry visit for [id number]. Since his VL was suppressed at his last visit he was eligible to go to step 2. He was randomized today to arm A which includes cabaneuva. He opted to go straight to Injections and was given them without problem. He was instructed to call us for any bothersome side effects and that he can use tylenol or ice to the injection sites for discomfort. His BP was still elevated today and said that he had run out of his lisinopril since his UMAP had not been renewed yet. He did meet with Olegario Messier today to make sure that was taken care of. He is taking his amlodipine, but requires both medications. We stressed the importance of making sure he is taking his BP meds as instructed and the risks associated with elevated BP. His rash to his perineal area has resolved and all std testing was negative. A clinic appt. Was made to follow up his BP ina few weeks. He will return for study in 4 weeks.

## 2021-07-17 NOTE — Addendum Note (Signed)
Addended by: Phill Myron on: 07/17/2021 01:53 PM   Modules accepted: Orders

## 2021-07-19 LAB — LIPID PANEL
Cholesterol: 179 mg/dL (ref <200)
HDL: 37 mg/dL — ABNORMAL LOW (ref >=40)
LDL Cholesterol (Calc): 112 mg/dL (calc) — ABNORMAL HIGH
Non-HDL Cholesterol (Calc): 142 mg/dL (calc) — ABNORMAL HIGH (ref <130)
Total CHOL/HDL Ratio: 4.8 (calc) (ref <5.0)
Triglycerides: 186 mg/dL — ABNORMAL HIGH (ref <150)

## 2021-07-19 LAB — HIV-1 RNA QUANT-NO REFLEX-BLD
HIV 1 RNA Quant: NOT DETECTED Copies/mL
HIV-1 RNA Quant, Log: NOT DETECTED Log cps/mL

## 2021-07-28 ENCOUNTER — Other Ambulatory Visit (HOSPITAL_COMMUNITY): Payer: Self-pay

## 2021-08-14 ENCOUNTER — Encounter: Payer: Self-pay | Admitting: *Deleted

## 2021-08-14 ENCOUNTER — Ambulatory Visit: Payer: Self-pay | Admitting: Family

## 2021-08-15 ENCOUNTER — Encounter: Payer: Self-pay | Admitting: *Deleted

## 2021-08-20 ENCOUNTER — Other Ambulatory Visit: Payer: Self-pay

## 2021-08-20 ENCOUNTER — Encounter (INDEPENDENT_AMBULATORY_CARE_PROVIDER_SITE_OTHER): Payer: Self-pay | Admitting: *Deleted

## 2021-08-20 VITALS — BP 130/94 | HR 76 | Temp 98.1°F | Wt 216.4 lb

## 2021-08-20 DIAGNOSIS — Z006 Encounter for examination for normal comparison and control in clinical research program: Secondary | ICD-10-CM

## 2021-08-20 MED ORDER — STUDY - A5359 (STEP 2) - RILPIVERINE 900MG/3ML INJECTION (PI-VAN DAM)
600.0000 mg | INJECTION | INTRAMUSCULAR | Status: DC
  Filled 2021-08-20: qty 2

## 2021-08-20 MED ORDER — STUDY - A5359 (STEP 2) - CABOTEGRAVIR 600MG/3ML INJECTION (PI-VAN DAM)
400.0000 mg | INJECTION | INTRAMUSCULAR | Status: DC
  Filled 2021-08-20: qty 2

## 2021-08-20 NOTE — Research (Signed)
Clifford Shields was here for his 2 injections of cabaneuva. The last cabotegravir injection created a knot up to a quarter size which lasted about 4 weeks. It has totally resolved now. He said it was very painful, but he did not use anything for pain last time. I told him to use ice for swelling and take something like tylenol or ibuprofen for pain in the future. He denied any other new meds or concerns. He was given another set of cabaneuva today without any problem. He also said he had a few days of feeling tired after the last injections.

## 2021-08-22 LAB — CBC WITH DIFFERENTIAL/PLATELET
Absolute Monocytes: 407 cells/uL (ref 200–950)
Basophils Absolute: 39 cells/uL (ref 0–200)
Basophils Relative: 0.7 %
Eosinophils Absolute: 380 cells/uL (ref 15–500)
Eosinophils Relative: 6.9 %
HCT: 41.5 % (ref 38.5–50.0)
Hemoglobin: 13.3 g/dL (ref 13.2–17.1)
Lymphs Abs: 2475 cells/uL (ref 850–3900)
MCH: 26.5 pg — ABNORMAL LOW (ref 27.0–33.0)
MCHC: 32 g/dL (ref 32.0–36.0)
MCV: 82.7 fL (ref 80.0–100.0)
MPV: 11.9 fL (ref 7.5–12.5)
Monocytes Relative: 7.4 %
Neutro Abs: 2200 cells/uL (ref 1500–7800)
Neutrophils Relative %: 40 %
Platelets: 221 10*3/uL (ref 140–400)
RBC: 5.02 10*6/uL (ref 4.20–5.80)
RDW: 13.6 % (ref 11.0–15.0)
Total Lymphocyte: 45 %
WBC: 5.5 10*3/uL (ref 3.8–10.8)

## 2021-08-22 LAB — COMPREHENSIVE METABOLIC PANEL
AG Ratio: 1.1 (calc) (ref 1.0–2.5)
ALT: 24 U/L (ref 9–46)
AST: 25 U/L (ref 10–40)
Albumin: 4.2 g/dL (ref 3.6–5.1)
Alkaline phosphatase (APISO): 72 U/L (ref 36–130)
BUN: 10 mg/dL (ref 7–25)
CO2: 25 mmol/L (ref 20–32)
Calcium: 9.4 mg/dL (ref 8.6–10.3)
Chloride: 106 mmol/L (ref 98–110)
Creat: 1.25 mg/dL (ref 0.60–1.26)
Globulin: 3.7 g/dL (calc) (ref 1.9–3.7)
Glucose, Bld: 95 mg/dL (ref 65–99)
Potassium: 3.9 mmol/L (ref 3.5–5.3)
Sodium: 142 mmol/L (ref 135–146)
Total Bilirubin: 0.3 mg/dL (ref 0.2–1.2)
Total Protein: 7.9 g/dL (ref 6.1–8.1)

## 2021-08-22 LAB — HIV-1 RNA QUANT-NO REFLEX-BLD
HIV 1 RNA Quant: NOT DETECTED Copies/mL
HIV-1 RNA Quant, Log: NOT DETECTED Log cps/mL

## 2021-08-22 LAB — BILIRUBIN, DIRECT: Bilirubin, Direct: 0.1 mg/dL (ref 0.0–0.2)

## 2021-09-18 ENCOUNTER — Encounter: Payer: Self-pay | Admitting: *Deleted

## 2021-11-20 ENCOUNTER — Ambulatory Visit: Payer: Self-pay

## 2021-12-01 ENCOUNTER — Ambulatory Visit: Payer: Self-pay | Admitting: Family

## 2022-04-17 ENCOUNTER — Ambulatory Visit (INDEPENDENT_AMBULATORY_CARE_PROVIDER_SITE_OTHER): Payer: No Typology Code available for payment source | Admitting: Pharmacist

## 2022-04-17 ENCOUNTER — Other Ambulatory Visit: Payer: Self-pay

## 2022-04-17 DIAGNOSIS — B20 Human immunodeficiency virus [HIV] disease: Secondary | ICD-10-CM

## 2022-04-17 NOTE — Progress Notes (Signed)
HPI: Clifford Shields is a 36 y.o. male who presents to the [facility] pharmacy clinic for HIV follow-up.  Patient Active Problem List   Diagnosis Date Noted   No-show for appointment 03/04/2021   Poor dentition 02/27/2021   Routine screening for STI (sexually transmitted infection) 07/31/2019   Encounter for long-term (current) use of high-risk medication 07/31/2019   Rash and nonspecific skin eruption 11/05/2016   Inguinal adenopathy 05/28/2015   Testosterone deficiency 03/29/2015   Vitamin D deficiency 03/29/2015   Marijuana abuse 03/29/2015   Tibial plateau fracture 03/28/2015   Closed fracture of unspecified phalanx or phalanges of hand 04/04/2014   Tobacco abuse 02/22/2013   Essential hypertension, benign 12/24/2009   Human immunodeficiency virus (HIV) disease (HCC) 11/23/2006    Patient's Medications  New Prescriptions   No medications on file  Previous Medications   AMLODIPINE (NORVASC) 10 MG TABLET    Take 1 tablet (10 mg total) by mouth daily.   HYDROCORTISONE CREAM 1 %    Apply to affected area 2 times daily   LISINOPRIL (ZESTRIL) 20 MG TABLET    Take 1 tablet (20 mg total) by mouth daily.  Modified Medications   No medications on file  Discontinued Medications   No medications on file    Allergies: Allergies  Allergen Reactions   Doxycycline Itching and Rash    Has had similar reaction twice with doxycycline resolves with discontinuation-develops into blisters.    Past Medical History: Past Medical History:  Diagnosis Date   HIV disease (HCC)    Hypertension    Marijuana use    Poor dentition 02/27/2021    Social History: Social History   Socioeconomic History   Marital status: Media planner    Spouse name: Not on file   Number of children: 0   Years of education: Not on file   Highest education level: Not on file  Occupational History   Not on file  Tobacco Use   Smoking status: Every Day    Packs/day: 0.50    Years: 15.00    Total pack  years: 7.50    Types: Cigarettes   Smokeless tobacco: Never  Vaping Use   Vaping Use: Never used  Substance and Sexual Activity   Alcohol use: Yes    Alcohol/week: 0.0 standard drinks of alcohol    Comment: rarely   Drug use: Not Currently    Frequency: 14.0 times per week    Types: Marijuana   Sexual activity: Yes    Partners: Male    Birth control/protection: Condom    Comment: given condoms  Other Topics Concern   Not on file  Social History Narrative   Not on file   Social Determinants of Health   Financial Resource Strain: Low Risk  (08/22/2019)   Overall Financial Resource Strain (CARDIA)    Difficulty of Paying Living Expenses: Not hard at all  Food Insecurity: No Food Insecurity (08/22/2019)   Hunger Vital Sign    Worried About Running Out of Food in the Last Year: Never true    Ran Out of Food in the Last Year: Never true  Transportation Needs: No Transportation Needs (08/22/2019)   PRAPARE - Administrator, Civil Service (Medical): No    Lack of Transportation (Non-Medical): No  Physical Activity: Insufficiently Active (08/22/2019)   Exercise Vital Sign    Days of Exercise per Week: 3 days    Minutes of Exercise per Session: 30 min  Stress: No Stress Concern  Present (08/22/2019)   [employer] of Occupational Health - Occupational Stress Questionnaire    Feeling of Stress : Only a little  Social Connections: Unknown (08/22/2019)   Social Connection and Isolation Panel [NHANES]    Frequency of Communication with Friends and Family: Three times a week    Frequency of Social Gatherings with Friends and Family: Twice a week    Attends Religious Services: Patient refused    Active Member of Clubs or Organizations: Patient refused    Attends Banker Meetings: Patient refused    Marital Status: Living with partner    Labs: Lab Results  Component Value Date   HIV1RNAQUANT Not Detected 08/20/2021   HIV1RNAQUANT Not Detected 07/17/2021    HIV1RNAQUANT Not Detected 07/03/2021   CD4TABS 553 03/11/2021   CD4TABS 648 02/18/2021   CD4TABS 727 01/23/2021    RPR and STI Lab Results  Component Value Date   LABRPR NON REACTIVE 07/04/2021   LABRPR NON-REACTIVE 01/23/2021   LABRPR REACTIVE (A) 03/27/2020   LABRPR NON-REACTIVE 05/29/2019   LABRPR NON-REACTIVE 04/27/2018   RPRTITER 1:1 (H) 03/27/2020    STI Results GC CT  07/04/2021  8:18 PM Negative  Negative   04/11/2020 12:01 PM Negative    Negative    Negative  Negative    Negative    Negative   07/31/2019 11:59 AM Negative    Negative    Negative  Negative    Negative    Negative   05/29/2019 12:00 AM Negative  Negative   04/27/2018 12:00 AM Negative  Negative   05/24/2017 12:00 AM Negative  Negative   10/19/2016 12:00 AM Negative    Negative    Negative    Negative  **POSITIVE**    Negative    Negative    **POSITIVE**   05/28/2015 12:00 AM Negative  Negative   04/12/2015 12:00 AM **POSITIVE**  Negative   02/17/2015 12:00 AM **POSITIVE**  Negative   10/04/2014 12:00 AM NG: Negative  CT: Negative   04/30/2007  2:58 PM  POSITIVE (NOTE)  Testing performed using the BD Probetec ET Chlamydia trachomatis and Neisseria gonorrhea amplified DNA assay.     Hepatitis B Lab Results  Component Value Date   HEPBSAB BORDERLINE (A) 02/18/2021   HEPBSAG NON-REACTIVE 02/18/2021   HEPBCAB NON-REACTIVE 02/18/2021   Hepatitis C Lab Results  Component Value Date   HEPCAB NON-REACTIVE 02/18/2021   Hepatitis A Lab Results  Component Value Date   HAV NEG 02/07/2013   Lipids: Lab Results  Component Value Date   CHOL 179 07/17/2021   TRIG 186 (H) 07/17/2021   HDL 37 (L) 07/17/2021   CHOLHDL 4.8 07/17/2021   VLDL 35 10/04/2014   LDLCALC 112 (H) 07/17/2021    Current HIV Regimen: Off medication since December 2022   Assessment: [name] presents to clinic today for HIV follow-up after being out of care since 2022. He last saw Dr. Daiva Eves in June and the research  team in December. Received Cabenuva through the LATITUDE study but experienced large lumps after the 2 loading doses that were painful which deferred him from continuing with the injections and the study. States he can still feel the knots from these injections. Confirms he has been off of medicine since then. Would technically have been covered with Cabenuva through the beginning of February this year but has still been ~6 months without medication. States he "felt better" off of medications and that Genvoya (along with amlodipine and lisinopril) made him feel  fatigued and groggy throughout the day. Also states he broke his leg while jogging 6 years ago, and someone told him that it was because of his HIV medicines decreasing his bone density. Reviewed that there were probably multiple factors that impacted his fatigue and broken leg. Also discussed that withholding ART can not only impact his immune system and risk of opportunistic infections but can also lead to worsening inflammation throughout his body. He said the state called him asking why he wasn't on medicine and that he decided he should get back into care. He also discussed that a major barrier to his adherence to ART was lack of transportation to pick up refills. He has a car now for appointments and is starting a new job in [place] soon.   Will restart patient on medicine today and will check HIV RNA with genotype, CD4 count, RPR, and urine cytology. Reviewed that given his history with Genvoya and two doses of Cabenuva, there is a chance he could have developed resistance to the integrase inhibitor class. Will start him on Biktarvy regardless today and have him follow-up with Dr. Tommy Medal in 1 month for further assessment; his genotype should result back by then. Will meet with Deanna today for [employer] as he is insured; provided him with 4 weeks of samples. He requests that future refills be mailed through [employer].   Explained that Phillips Odor  is a one pill once daily medication with or without food and the importance of not missing any doses. Explained resistance and how it develops and why it is so important to take Biktarvy daily and not skip days or doses. Counseled patient to take it around the same time each day. Counseled on what to do if dose is missed, if closer to missed dose take immediately, if closer to next dose then skip and resume normal schedule. Cautioned on possible side effects the first week or so including nausea, diarrhea, dizziness, and headaches but that they should resolve after the first couple of weeks. I reviewed patient medications and found no drug interactions. Counseled patient to separate Biktarvy from divalent cations including multivitamins. Discussed with patient to call clinic if he starts a new medication or herbal supplement. I gave the patient my card and told him to call me with any issues/questions/concerns.  Dr. 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 He has not been on any antihypertensive since December. Blood pressure today was 153/95. Will defer starting antihypertensives until he sees Dr. Tommy Medal and Sadie Haber is approved. Marzetta Board states he experienced a significant cough while on lisinopril and would like to avoid any further ACE inhibitors. He also continues to smoke and does not express desire to quit at this time. He does acknowledge that tobacco is "poison" but that quitting will be very difficult as he has been smoking since he was 14.   Plan: Check HIV RNA with genotype, CD4 count, RPR, and urine cytology Start Biktarvy Follow-up with Dr. Tommy Medal on 11/13 at 1:45pm  Alfonse Spruce, PharmD, CPP, [place] Clinical Pharmacist Practitioner Infectious Diseases [facility] 04/17/2022, 4:02 PM

## 2022-04-20 ENCOUNTER — Encounter: Payer: Self-pay | Admitting: Infectious Disease

## 2022-04-20 LAB — URINE CYTOLOGY ANCILLARY ONLY
Chlamydia: NEGATIVE
Comment: NEGATIVE
Comment: NORMAL
Neisseria Gonorrhea: NEGATIVE

## 2022-04-21 ENCOUNTER — Other Ambulatory Visit: Payer: Self-pay | Admitting: Pharmacist

## 2022-04-21 DIAGNOSIS — B20 Human immunodeficiency virus [HIV] disease: Secondary | ICD-10-CM

## 2022-04-21 LAB — RPR TITER: RPR Titer: 1:2 {titer} — ABNORMAL HIGH

## 2022-04-21 LAB — HIV RNA, RTPCR W/R GT (RTI, PI,INT)
HIV 1 RNA Quant: NOT DETECTED copies/mL
HIV-1 RNA Quant, Log: NOT DETECTED Log copies/mL

## 2022-04-21 LAB — T-HELPER CELLS (CD4) COUNT (NOT AT ARMC)
Absolute CD4: 858 cells/uL (ref 490–1740)
CD4 T Helper %: 36 % (ref 30–61)
Total lymphocyte count: 2396 cells/uL (ref 850–3900)

## 2022-04-21 LAB — RPR: RPR Ser Ql: REACTIVE — AB

## 2022-04-21 LAB — FLUORESCENT TREPONEMAL AB(FTA)-IGG-BLD: Fluorescent Treponemal ABS: REACTIVE — AB

## 2022-04-21 MED ORDER — BICTEGRAVIR-EMTRICITAB-TENOFOV 50-200-25 MG PO TABS: 1.0000 | ORAL_TABLET | Freq: Every day | ORAL | 0 refills | Status: AC

## 2022-04-21 NOTE — Progress Notes (Signed)
Medication Samples have been provided to the patient.  Drug name: Biktarvy        Strength: 50/200/25 mg       Qty: 28 tablets (4 bottles) LOT: [id number]   Exp.Date: 9/25  Dosing instructions: Take one tablet by mouth once daily  The patient has been instructed regarding the correct time, dose, and frequency of taking this medication, including desired effects and most common side effects.   Smita Lesh, PharmD, CPP, BCIDP Clinical Pharmacist Practitioner Infectious Diseases Clinical Pharmacist [facility]  

## 2022-05-07 ENCOUNTER — Ambulatory Visit (HOSPITAL_COMMUNITY)
Admission: EM | Admit: 2022-05-07 | Discharge: 2022-05-07 | Disposition: A | Discharge status: Home / Self Care | Payer: No Typology Code available for payment source | Attending: Family | Admitting: Family

## 2022-05-07 ENCOUNTER — Other Ambulatory Visit: Payer: Self-pay | Admitting: Internal Medicine

## 2022-05-07 ENCOUNTER — Telehealth: Payer: Self-pay

## 2022-05-07 ENCOUNTER — Encounter (HOSPITAL_COMMUNITY): Payer: Self-pay | Admitting: Emergency Medicine

## 2022-05-07 DIAGNOSIS — B029 Zoster without complications: Secondary | ICD-10-CM

## 2022-05-07 DIAGNOSIS — R21 Rash and other nonspecific skin eruption: Secondary | ICD-10-CM

## 2022-05-07 MED ORDER — VALACYCLOVIR HCL 1 G PO TABS: 1000.0000 mg | ORAL_TABLET | Freq: Three times a day (TID) | ORAL | 0 refills | Status: AC

## 2022-05-07 NOTE — Discharge Instructions (Addendum)
Recommend start Valtrex 1g 3 times a day for 10 days. May take OTC Ibuprofen 800mg  every 8 hours as needed for pain. May also apply Calamine lotion or Caladryl for comfort and to help with itching. Apply cool compresses to area for comfort. Avoid steroid creams. Continue other medications. Follow-up in 3 to 4 days if not improving.

## 2022-05-07 NOTE — ED Triage Notes (Signed)
Patient c/o Rash in RT axillary area and on chest x 2 days.   Patient endorses onset of symptoms began on Rt side of upper back w/ pain.   Patient endorses itchiness and pain.   Patient endorses worsening of symptoms.   Patient endorses history of shingles.   Patient hasn't used any medications for symptoms.

## 2022-05-07 NOTE — Telephone Encounter (Signed)
Patient called, says he is out of medication and needs Biktarvy sent to [facility] on [address].   Sandie Ano, RN

## 2022-05-07 NOTE — ED Provider Notes (Signed)
[facility]    CSN: [id number] Arrival date & time: 05/07/22  1603      History   Chief Complaint Chief Complaint  Patient presents with   Rash    HPI Clifford Shields is a 36 y.o. male.   36 year old male presents with painful rash present around his right axilla and right chest for the past 2 to 3 days. First noticed right upper back was "sore/painful" but no rash yet about 4 days ago. Then noticed a few painful bumps at right axilla. Today the lesions are spreading to the right central chest area and further down his right upper arm. They are itchy and painful. He has not applied any topical medication to area. He does feel more fatigued and has some generalized body aches on his right side. He previously had shingles about 10 years ago on his left upper leg/hip area. He does have a history of HIV and HTN and currently on Biktarvy, Lisinopril and Norvasc daily.   The history is provided by the patient.    Past Medical History:  Diagnosis Date   HIV disease (HCC)    Hypertension    Marijuana use    Poor dentition 02/27/2021    Patient Active Problem List   Diagnosis Date Noted   No-show for appointment 03/04/2021   Poor dentition 02/27/2021   Routine screening for STI (sexually transmitted infection) 07/31/2019   Encounter for long-term (current) use of high-risk medication 07/31/2019   Rash and nonspecific skin eruption 11/05/2016   Inguinal adenopathy 05/28/2015   Testosterone deficiency 03/29/2015   Vitamin D deficiency 03/29/2015   Marijuana abuse 03/29/2015   Tibial plateau fracture 03/28/2015   Closed fracture of unspecified phalanx or phalanges of hand 04/04/2014   Tobacco abuse 02/22/2013   Essential hypertension, benign 12/24/2009   Human immunodeficiency virus (HIV) disease (HCC) 11/23/2006    Past Surgical History:  Procedure Laterality Date   ORIF TIBIA PLATEAU Left 03/28/2015   Procedure: OPEN REDUCTION INTERNAL FIXATION (ORIF) LEFT TIBIAL  PLATEAU FRACTURE;  Surgeon: Myrene Galas, MD;  Location: [facility];  Service: Orthopedics;  Laterality: Left;       Home Medications    Prior to Admission medications   Medication Sig Start Date End Date Taking? Authorizing Provider  amLODipine (NORVASC) 10 MG tablet Take 1 tablet (10 mg total) by mouth daily. 02/21/21  Yes Daiva Eves, Lisette Grinder, MD  bictegravir-emtricitabine-tenofovir AF (BIKTARVY) 50-200-25 MG TABS tablet Take 1 tablet by mouth daily for 28 days. 04/21/22 05/19/22 Yes Jennette Kettle, RPH-CPP  lisinopril (ZESTRIL) 20 MG tablet Take 1 tablet (20 mg total) by mouth daily. 02/27/21  Yes Daiva Eves, Lisette Grinder, MD  valACYclovir (VALTREX) 1000 MG tablet Take 1 tablet (1,000 mg total) by mouth 3 (three) times daily for 10 days. 05/07/22 05/17/22 Yes Bubba Vanbenschoten, Ali Lowe, NP  bictegravir-emtricitabine-tenofovir AF (BIKTARVY) 50-200-25 MG TABS tablet Take 1 tablet by mouth daily. 05/08/22   Jennette Kettle, RPH-CPP    Family History Family History  Problem Relation Age of Onset   Kidney disease Mother    Hypertension Mother    Kidney disease Father    Hypertension Father    CVA Father 14    Social History Social History   Tobacco Use   Smoking status: Every Day    Packs/day: 0.50    Years: 15.00    Total pack years: 7.50    Types: Cigarettes   Smokeless tobacco: Never  Vaping Use  Vaping Use: Never used  Substance Use Topics   Alcohol use: Yes    Alcohol/week: 0.0 standard drinks of alcohol    Comment: rarely   Drug use: Not Currently    Frequency: 14.0 times per week    Types: Marijuana     Allergies   Doxycycline   Review of Systems Review of Systems  Constitutional:  Positive for activity change and fatigue. Negative for appetite change, chills, diaphoresis and fever.  HENT:  Negative for congestion, mouth sores, sore throat and trouble swallowing.   Respiratory:  Negative for cough, chest tightness and shortness of breath.   Gastrointestinal:  Negative for nausea  and vomiting.  Musculoskeletal:  Positive for myalgias. Negative for neck pain and neck stiffness.  Skin:  Positive for rash.  Allergic/Immunologic: Positive for immunocompromised state. Negative for environmental allergies and food allergies.  Neurological:  Negative for dizziness, tremors, seizures, syncope, speech difficulty, light-headedness and numbness.  Hematological:  Negative for adenopathy.     Physical Exam Triage Vital Signs ED Triage Vitals  Enc Vitals Group     BP 05/07/22 1717 105/81     Pulse Rate 05/07/22 1717 74     Resp 05/07/22 1717 12     Temp 05/07/22 1717 97.8 F (36.6 C)     Temp Source 05/07/22 1717 Oral     SpO2 05/07/22 1717 98 %     Weight --      Height --      Head Circumference --      Peak Flow --      Pain Score 05/07/22 1722 10     Pain Loc --      Pain Edu? --      Excl. in [place]? --    No data found.  Updated Vital Signs BP 105/81 (BP Location: Left Arm)   Pulse 74   Temp 97.8 F (36.6 C) (Oral)   Resp 12   SpO2 98%   Visual Acuity Right Eye Distance:   Left Eye Distance:   Bilateral Distance:    Right Eye Near:   Left Eye Near:    Bilateral Near:     Physical Exam Vitals and nursing note reviewed.  Constitutional:      General: He is awake. He is not in acute distress.    Appearance: He is well-developed and well-groomed.     Comments: He is sitting on the exam table in no acute distress but appears tired and uncomfortable due to rash.   HENT:     Head: Normocephalic and atraumatic.     Right Ear: Hearing normal.     Left Ear: Hearing normal.  Eyes:     Extraocular Movements: Extraocular movements intact.     Conjunctiva/sclera: Conjunctivae normal.  Cardiovascular:     Rate and Rhythm: Normal rate.  Pulmonary:     Effort: Pulmonary effort is normal.  Chest:     Chest wall: Tenderness present.       Comments: Multiple clusters of light colored raised papular lesions with clear-fluid centers, some more vesicular,  just above and in his right axilla, along his right upper inner arm and a few lesions on the right central area of his chest, just right of midline. No crusting or discharge. Tender to palpation. No surrounding erythema. No signs of secondary bacterial infection.  Musculoskeletal:        General: Normal range of motion.     Cervical back: Normal range of motion.  Skin:  General: Skin is warm and dry.     Capillary Refill: Capillary refill takes less than 2 seconds.     Findings: Lesion and rash present. No ecchymosis or petechiae. Rash is papular and vesicular.  Neurological:     General: No focal deficit present.     Mental Status: He is alert and oriented to person, place, and time.  Psychiatric:        Mood and Affect: Mood normal.        Behavior: Behavior normal. Behavior is cooperative.        Thought Content: Thought content normal.        Judgment: Judgment normal.     UC Treatments / Results  Labs (all labs ordered are listed, but only abnormal results are displayed) Labs Reviewed - No data to display  EKG   Radiology No results found.  Procedures Procedures (including critical care time)  Medications Ordered in UC Medications - No data to display  Initial Impression / Assessment and Plan / UC Course  I have reviewed the triage vital signs and the nursing notes.  Pertinent labs & imaging results that were available during my care of the patient were reviewed by me and considered in my medical decision making (see chart for details).     Reviewed with patient that he appears to have Herpes Zoster or Shingles again. Discussed that any stress to his body and with history of HIV can increase chance of recurrence of Shingles. Recommend start Valtrex 1g 3 times a day for 10 days. May take OTC  Ibuprofen 800mg  every 8 hours as needed for pain. May apply cool compresses and Calamine/Caladryl lotion to area for comfort. Avoid steroid creams. Continue other medications.  Follow-up in 3 to 4 days if not improving.  Final Clinical Impressions(s) / UC Diagnoses   Final diagnoses:  Herpes zoster without complication  Rash and nonspecific skin eruption     Discharge Instructions      Recommend start Valtrex 1g 3 times a day for 10 days. May take OTC Ibuprofen 800mg  every 8 hours as needed for pain. May also apply Calamine lotion or Caladryl for comfort and to help with itching. Apply cool compresses to area for comfort. Avoid steroid creams. Continue other medications. Follow-up in 3 to 4 days if not improving.     ED Prescriptions     Medication Sig Dispense Auth. Provider   valACYclovir (VALTREX) 1000 MG tablet Take 1 tablet (1,000 mg total) by mouth 3 (three) times daily for 10 days. 30 tablet Lindamarie Maclachlan, , NP      PDMP not reviewed this encounter.   , NP 05/08/22 1040

## 2022-05-08 ENCOUNTER — Other Ambulatory Visit: Payer: Self-pay | Admitting: Pharmacist

## 2022-05-08 DIAGNOSIS — B20 Human immunodeficiency virus [HIV] disease: Secondary | ICD-10-CM

## 2022-05-08 MED ORDER — BICTEGRAVIR-EMTRICITAB-TENOFOV 50-200-25 MG PO TABS
1.0000 | ORAL_TABLET | Freq: Every day | ORAL | 1 refills | Status: DC
Start: 2022-05-08 — End: 2022-12-09

## 2022-05-08 NOTE — Telephone Encounter (Signed)
Done

## 2022-05-08 NOTE — Telephone Encounter (Signed)
Ok to refill? Has appointment with you on 05/27/22

## 2022-05-27 ENCOUNTER — Ambulatory Visit: Payer: Self-pay | Admitting: Infectious Disease

## 2022-06-01 ENCOUNTER — Ambulatory Visit: Payer: Self-pay | Admitting: Infectious Disease

## 2022-12-09 ENCOUNTER — Encounter: Payer: Self-pay | Admitting: Infectious Disease

## 2022-12-09 ENCOUNTER — Ambulatory Visit (INDEPENDENT_AMBULATORY_CARE_PROVIDER_SITE_OTHER): Payer: No Typology Code available for payment source | Admitting: Infectious Disease

## 2022-12-09 ENCOUNTER — Other Ambulatory Visit: Payer: Self-pay

## 2022-12-09 VITALS — BP 158/128 | HR 94 | Resp 16 | Ht 70.5 in | Wt 220.0 lb

## 2022-12-09 DIAGNOSIS — B20 Human immunodeficiency virus [HIV] disease: Secondary | ICD-10-CM

## 2022-12-09 DIAGNOSIS — I10 Essential (primary) hypertension: Secondary | ICD-10-CM | POA: Diagnosis not present

## 2022-12-09 DIAGNOSIS — Z91199 Patient's noncompliance with other medical treatment and regimen due to unspecified reason: Secondary | ICD-10-CM

## 2022-12-09 MED ORDER — LISINOPRIL 20 MG PO TABS: ORAL_TABLET | ORAL | 11 refills | Status: DC

## 2022-12-09 MED ORDER — AMLODIPINE BESYLATE 10 MG PO TABS: 10.0000 mg | ORAL_TABLET | Freq: Every day | ORAL | 11 refills | Status: DC

## 2022-12-09 MED ORDER — BICTEGRAVIR-EMTRICITAB-TENOFOV 50-200-25 MG PO TABS: 1.0000 | ORAL_TABLET | Freq: Every day | ORAL | 6 refills | Status: DC

## 2022-12-09 NOTE — Progress Notes (Signed)
Subjective:  Chief complaint: Follow-up for HIV disease not taking medications   Patient ID: Clifford Shields, male    DOB: 09-15-1985, 37 y.o.   MRN: [id number]  HPI  80 -year-old black man living with HIV over 16 years now who has had intermittent adherence to antiretroviral therapy and more lead to making his appointments and making sure that he renews his HIV medication assistance program.  He had been previously on envoy though we do not have any evidence of virological failure with [facility] resistance we do need to keep this possibility in mind.  He ultimately was enrolled into the [facility] and made it through to be randomized to injectable and did receive injectable Cabenuva x 1.  Unfortunately he developed swelling at one of the injection sites which she dislikes.  He said he actually did like the fact that he got the injectables and did not have to remember taking pills.  He then came out of study.  He has been intermittently adherent to [facility] since then.  He last filled in December despite having renewed his HIV medication assistance program he also has not received his antihypertensive medications either.    Past Medical History:  Diagnosis Date   HIV disease ([place])    Hypertension    Marijuana use    Poor dentition 02/27/2021    Past Surgical History:  Procedure Laterality Date   ORIF TIBIA PLATEAU Left 03/28/2015   Procedure: OPEN REDUCTION INTERNAL FIXATION (ORIF) LEFT TIBIAL PLATEAU FRACTURE;  Surgeon: Altamese Milton, MD;  Location: [facility];  Service: Orthopedics;  Laterality: Left;    Family History  Problem Relation Age of Onset   Kidney disease Mother    Hypertension Mother    Kidney disease Father    Hypertension Father    CVA Father 67      Social History   Socioeconomic History   Marital status: Soil scientist    Spouse name: Not on file   Number of children: 0   Years of education: Not on file   Highest education level: Not on file   Occupational History   Not on file  Tobacco Use   Smoking status: Every Day    Packs/day: 0.50    Years: 15.00    Additional pack years: 0.00    Total pack years: 7.50    Types: Cigarettes    Passive exposure: Never   Smokeless tobacco: Never  Vaping Use   Vaping Use: Never used  Substance and Sexual Activity   Alcohol use: Yes    Alcohol/week: 0.0 standard drinks of alcohol    Comment: rarely   Drug use: Not Currently    Frequency: 14.0 times per week    Types: Marijuana   Sexual activity: Yes    Partners: Male    Birth control/protection: Condom    Comment: given condoms  Other Topics Concern   Not on file  Social History Narrative   Not on file   Social Determinants of Health   Financial Resource Strain: Low Risk  (08/22/2019)   Overall Financial Resource Strain (CARDIA)    Difficulty of Paying Living Expenses: Not hard at all  Food Insecurity: No Food Insecurity (08/22/2019)   Hunger Vital Sign    Worried About Running Out of Food in the Last Year: Never true    Ran Out of Food in the Last Year: Never true  Transportation Needs: No Transportation Needs (08/22/2019)   PRAPARE - Transportation    Lack of Transportation (  Medical): No    Lack of Transportation (Non-Medical): No  Physical Activity: Insufficiently Active (08/22/2019)   Exercise Vital Sign    Days of Exercise per Week: 3 days    Minutes of Exercise per Session: 30 min  Stress: No Stress Concern Present (08/22/2019)   [place]    Feeling of Stress : Only a little  Social Connections: Unknown (08/22/2019)   Social Connection and Isolation Panel [NHANES]    Frequency of Communication with Friends and Family: Three times a week    Frequency of Social Gatherings with Friends and Family: Twice a week    Attends Religious Services: Patient declined    Marine scientist or Organizations: Patient declined    Attends Archivist  Meetings: Patient declined    Marital Status: Living with partner    Allergies  Allergen Reactions   Doxycycline Itching and Rash    Has had similar reaction twice with doxycycline resolves with discontinuation-develops into blisters.     Current Outpatient Medications:    amLODipine (NORVASC) 10 MG tablet, Take 1 tablet (10 mg total) by mouth daily., Disp: 30 tablet, Rfl: 11   bictegravir-emtricitabine-tenofovir AF (BIKTARVY) 50-200-25 MG TABS tablet, Take 1 tablet by mouth daily., Disp: 30 tablet, Rfl: 6   lisinopril (ZESTRIL) 20 MG tablet, TAKE 1 TABLET(20 MG) BY MOUTH DAILY, Disp: 30 tablet, Rfl: 11   Review of Systems  Constitutional:  Negative for activity change, appetite change, chills, diaphoresis, fatigue, fever and unexpected weight change.  HENT:  Negative for congestion, rhinorrhea, sinus pressure, sneezing, sore throat and trouble swallowing.   Eyes:  Negative for photophobia and visual disturbance.  Respiratory:  Negative for cough, chest tightness, shortness of breath, wheezing and stridor.   Cardiovascular:  Negative for chest pain, palpitations and leg swelling.  Gastrointestinal:  Negative for abdominal distention, abdominal pain, anal bleeding, blood in stool, constipation, diarrhea, nausea and vomiting.  Genitourinary:  Negative for difficulty urinating, dysuria, flank pain and hematuria.  Musculoskeletal:  Negative for arthralgias, back pain, gait problem, joint swelling and myalgias.  Skin:  Negative for color change, pallor, rash and wound.  Neurological:  Negative for dizziness, tremors, weakness and light-headedness.  Hematological:  Negative for adenopathy. Does not bruise/bleed easily.  Psychiatric/Behavioral:  Negative for agitation, behavioral problems, confusion, decreased concentration, dysphoric mood and sleep disturbance.        Objective:   Physical Exam Constitutional:      Appearance: He is well-developed.  HENT:     Head: Normocephalic and  atraumatic.  Eyes:     Conjunctiva/sclera: Conjunctivae normal.  Cardiovascular:     Rate and Rhythm: Normal rate and regular rhythm.  Pulmonary:     Effort: Pulmonary effort is normal. No respiratory distress.     Breath sounds: No wheezing.  Abdominal:     General: There is no distension.     Palpations: Abdomen is soft.  Musculoskeletal:        General: No tenderness. Normal range of motion.     Cervical back: Normal range of motion and neck supple.  Skin:    General: Skin is warm and dry.     Coloration: Skin is not pale.     Findings: No erythema or rash.  Neurological:     General: No focal deficit present.     Mental Status: He is alert and oriented to person, place, and time.  Psychiatric:  Mood and Affect: Mood normal.        Behavior: Behavior normal.        Thought Content: Thought content normal.        Judgment: Judgment normal.           Assessment & Plan:   HIV disease:  I will add order HIV viral load w reflex to genotype CD4 count CBC with differential CMP, RPR GC and chlamydia and I will restart  [facility],  prescription  Hypertension: poorly controlled.  Sent in prescriptions for lisinopril and amlodipine.

## 2022-12-10 LAB — T-HELPER CELLS (CD4) COUNT (NOT AT ARMC)
CD4 % Helper T Cell: 34 % (ref 33–65)
CD4 T Cell Abs: 781 /uL (ref 400–1790)

## 2022-12-22 ENCOUNTER — Ambulatory Visit (HOSPITAL_COMMUNITY)
Admission: EM | Admit: 2022-12-22 | Discharge: 2022-12-22 | Disposition: A | Discharge status: Home / Self Care | Payer: No Typology Code available for payment source | Attending: Emergency Medicine | Admitting: Emergency Medicine

## 2022-12-22 ENCOUNTER — Encounter (HOSPITAL_COMMUNITY): Payer: Self-pay

## 2022-12-22 DIAGNOSIS — J069 Acute upper respiratory infection, unspecified: Secondary | ICD-10-CM | POA: Diagnosis not present

## 2022-12-22 LAB — POCT RAPID STREP A, ED / UC: Streptococcus, Group A Screen (Direct): NEGATIVE

## 2022-12-22 MED ORDER — BENZONATATE 100 MG PO CAPS: 100.0000 mg | ORAL_CAPSULE | Freq: Three times a day (TID) | ORAL | 0 refills | Status: DC

## 2022-12-22 MED ORDER — PROMETHAZINE-DM 6.25-15 MG/5ML PO SYRP: 5.0000 mL | ORAL_SOLUTION | Freq: Four times a day (QID) | ORAL | 0 refills | Status: DC | PRN

## 2022-12-22 MED ORDER — PREDNISONE 20 MG PO TABS: 40.0000 mg | ORAL_TABLET | Freq: Every day | ORAL | 0 refills | Status: DC

## 2022-12-22 NOTE — ED Provider Notes (Signed)
[facility]    CSN: [id number] Arrival date & time: 12/22/22  1820      History   Chief Complaint Chief Complaint  Patient presents with   Cough    HPI Clifford Shields is a 37 y.o. male.   Patient presents for evaluation of fever, chills, body aches, nasal congestion, rhinorrhea, sore throat, cough, shortness of breath and diarrhea beginning 2 days ago.  Diarrhea has resolved.  Cough is nonproductive.  Shortness of breath is experienced at rest, worsened by exertion.  Denies respiratory history.  Has attempted use of nasal spray and Mucinex which has been minimally effective.  Tolerating food and liquids.  No known sick contacts prior but endorses that he was in the emergency department with a family member prior to symptoms occurring.   Past Medical History:  Diagnosis Date   HIV disease    Hypertension    Marijuana use    Poor dentition 02/27/2021    Patient Active Problem List   Diagnosis Date Noted   No-show for appointment 03/04/2021   Poor dentition 02/27/2021   Routine screening for STI (sexually transmitted infection) 07/31/2019   Encounter for long-term (current) use of high-risk medication 07/31/2019   Rash and nonspecific skin eruption 11/05/2016   Inguinal adenopathy 05/28/2015   Testosterone deficiency 03/29/2015   Vitamin D deficiency 03/29/2015   Marijuana abuse 03/29/2015   Tibial plateau fracture 03/28/2015   Closed fracture of unspecified phalanx or phalanges of hand 04/04/2014   Tobacco abuse 02/22/2013   Essential hypertension, benign 12/24/2009   Human immunodeficiency virus (HIV) disease (HCC) 11/23/2006    Past Surgical History:  Procedure Laterality Date   ORIF TIBIA PLATEAU Left 03/28/2015   Procedure: OPEN REDUCTION INTERNAL FIXATION (ORIF) LEFT TIBIAL PLATEAU FRACTURE;  Surgeon: Myrene Galas, MD;  Location[facility];  Service: Orthopedics;  Laterality: Left;       Home Medications    Prior to Admission medications    Medication Sig Start Date End Date Taking? Authorizing Provider  amLODipine (NORVASC) 10 MG tablet Take 1 tablet (10 mg total) by mouth daily. 12/09/22   [name], MD  bictegravir-emtricitabine-tenofovir AF (BIKTARVY) 50-200-25 MG TABS tablet Take 1 tablet by mouth daily. 12/09/22   [name], MD  lisinopril (ZESTRIL) 20 MG tablet TAKE 1 TABLET(20 MG) BY MOUTH DAILY 12/09/22   Daiva Eves, Lisette Grinder, MD    Family History Family History  Problem Relation Age of Onset   Kidney disease Mother    Hypertension Mother    Kidney disease Father    Hypertension Father    CVA Father 80    Social History Social History   Tobacco Use   Smoking status: Every Day    Packs/day: 0.50    Years: 15.00    Additional pack years: 0.00    Total pack years: 7.50    Types: Cigarettes    Passive exposure: Never   Smokeless tobacco: Never  Vaping Use   Vaping Use: Never used  Substance Use Topics   Alcohol use: Yes    Alcohol/week: 0.0 standard drinks of alcohol    Comment: rarely   Drug use: Not Currently    Frequency: 14.0 times per week    Types: Marijuana     Allergies   Doxycycline   Review of Systems Review of Systems  Constitutional:  Positive for chills and fever. Negative for activity change, appetite change, diaphoresis, fatigue and unexpected weight change.  HENT:  Positive for  congestion, rhinorrhea and sore throat. Negative for dental problem, drooling, ear discharge, ear pain, facial swelling, hearing loss, mouth sores, nosebleeds, postnasal drip, sinus pressure, sinus pain, sneezing, tinnitus, trouble swallowing and voice change.   Respiratory:  Positive for cough and shortness of breath. Negative for apnea, choking, chest tightness, wheezing and stridor.   Cardiovascular: Negative.   Gastrointestinal:  Positive for diarrhea. Negative for abdominal distention, abdominal pain, anal bleeding, blood in stool, constipation, nausea, rectal pain and vomiting.   Musculoskeletal:  Positive for myalgias. Negative for arthralgias, back pain, gait problem, joint swelling, neck pain and neck stiffness.  Skin: Negative.      Physical Exam Triage Vital Signs ED Triage Vitals  Enc Vitals Group     BP 12/22/22 1859 (!) 177/122     Pulse Rate 12/22/22 1859 85     Resp 12/22/22 1859 18     Temp 12/22/22 1859 99.2 F (37.3 C)     Temp src --      SpO2 12/22/22 1859 97 %     Weight --      Height --      Head Circumference --      Peak Flow --      Pain Score 12/22/22 1858 10     Pain Loc --      Pain Edu? --      Excl. in [place]? --    No data found.  Updated Vital Signs BP (!) 177/122   Pulse 85   Temp 99.2 F (37.3 C)   Resp 18   SpO2 97%   Visual Acuity Right Eye Distance:   Left Eye Distance:   Bilateral Distance:    Right Eye Near:   Left Eye Near:    Bilateral Near:     Physical Exam Constitutional:      Appearance: Normal appearance.  HENT:     Head: Normocephalic.     Right Ear: Tympanic membrane, ear canal and external ear normal.     Left Ear: Tympanic membrane, ear canal and external ear normal.     Nose: Congestion and rhinorrhea present.     Mouth/Throat:     Mouth: Mucous membranes are moist.     Pharynx: Posterior oropharyngeal erythema present.  Cardiovascular:     Rate and Rhythm: Normal rate and regular rhythm.     Pulses: Normal pulses.     Heart sounds: Normal heart sounds.  Pulmonary:     Effort: Pulmonary effort is normal.     Comments: Wheezing rul, remaining lobes clear  Skin:    General: Skin is warm and dry.  Neurological:     Mental Status: He is alert and oriented to person, place, and time. Mental status is at baseline.      UC Treatments / Results  Labs (all labs ordered are listed, but only abnormal results are displayed) Labs Reviewed - No data to display  EKG   Radiology No results found.  Procedures Procedures (including critical care time)  Medications Ordered in  UC Medications - No data to display  Initial Impression / Assessment and Plan / UC Course  I have reviewed the triage vital signs and the nursing notes.  Pertinent labs & imaging results that were available during my care of the patient were reviewed by me and considered in my medical decision making (see chart for details).  Viral with cough  Patient is in no signs of distress nor toxic appearing.  Vital signs are stable.  Low suspicion for pneumonia, pneumothorax or bronchitis and therefore will defer imaging.  Strep test negative, declined for viral testing.  Prescribed prednisone, Tessalon and Promethazine DM.May use additional over-the-counter medications as needed for supportive care.  May follow-up with urgent care as needed if symptoms persist or worsen.  Note given.    Final Clinical Impressions(s) / UC Diagnoses   Final diagnoses:  None   Discharge Instructions   None    ED Prescriptions   None    PDMP not reviewed this encounter.   Valinda Hoar, NP 12/22/22 1945

## 2022-12-22 NOTE — Discharge Instructions (Addendum)
Your symptoms today are most likely being caused by a virus and should steadily improve in time it can take up to 7 to 10 days before you truly start to see a turnaround however things will get better, symptoms are because she may take a home COVID or flu test at home    Rapid strep test is negative  On exam able to hear wheezing within your lungs and you are also experiencing shortness of breath therefore begin prednisone every morning with food for 5 days to calm and relaxed airway  You may take Tessalon pill every 8 hours as needed to help calm your coughing  You may use cough syrup every 6 hours as needed for additional comfort, be mindful this may make you feel drowsy  You can take Tylenol and/or Ibuprofen as needed for fever reduction and pain relief.   For cough: honey 1/2 to 1 teaspoon (you can dilute the honey in water or another fluid).  You can also use guaifenesin and dextromethorphan for cough. You can use a humidifier for chest congestion and cough.  If you don't have a humidifier, you can sit in the bathroom with the hot shower running.      For sore throat: try warm salt water gargles, cepacol lozenges, throat spray, warm tea or water with lemon/honey, popsicles or ice, or OTC cold relief medicine for throat discomfort.   For congestion: take a daily anti-histamine like Zyrtec, Claritin, and a oral decongestant, such as pseudoephedrine.  You can also use Flonase 1-2 sprays in each nostril daily.   It is important to stay hydrated: drink plenty of fluids (water, gatorade/powerade/pedialyte, juices, or teas) to keep your throat moisturized and help further relieve irritation/discomfort.

## 2022-12-22 NOTE — ED Triage Notes (Signed)
Pt presents with complaints of cough and congestion x 2 days.

## 2022-12-26 LAB — LIPID PANEL
Cholesterol: 190 mg/dL (ref <200)
HDL: 39 mg/dL — ABNORMAL LOW (ref >=40)
LDL Cholesterol (Calc): 122 mg/dL (calc) — ABNORMAL HIGH
Non-HDL Cholesterol (Calc): 151 mg/dL (calc) — ABNORMAL HIGH (ref <130)
Total CHOL/HDL Ratio: 4.9 (calc) (ref <5.0)
Triglycerides: 171 mg/dL — ABNORMAL HIGH (ref <150)

## 2022-12-26 LAB — CBC WITH DIFFERENTIAL/PLATELET
Absolute Monocytes: 522 cells/uL (ref 200–950)
Basophils Absolute: 30 cells/uL (ref 0–200)
Basophils Relative: 0.5 %
Eosinophils Absolute: 312 cells/uL (ref 15–500)
Eosinophils Relative: 5.2 %
HCT: 41.3 % (ref 38.5–50.0)
Hemoglobin: 13.7 g/dL (ref 13.2–17.1)
Lymphs Abs: 2574 cells/uL (ref 850–3900)
MCH: 26.7 pg — ABNORMAL LOW (ref 27.0–33.0)
MCHC: 33.2 g/dL (ref 32.0–36.0)
MCV: 80.5 fL (ref 80.0–100.0)
MPV: 11.7 fL (ref 7.5–12.5)
Monocytes Relative: 8.7 %
Neutro Abs: 2562 cells/uL (ref 1500–7800)
Neutrophils Relative %: 42.7 %
Platelets: 228 10*3/uL (ref 140–400)
RBC: 5.13 10*6/uL (ref 4.20–5.80)
RDW: 13.1 % (ref 11.0–15.0)
Total Lymphocyte: 42.9 %
WBC: 6 10*3/uL (ref 3.8–10.8)

## 2022-12-26 LAB — COMPLETE METABOLIC PANEL WITH GFR
AG Ratio: 1.5 (calc) (ref 1.0–2.5)
ALT: 29 U/L (ref 9–46)
AST: 28 U/L (ref 10–40)
Albumin: 4.4 g/dL (ref 3.6–5.1)
Alkaline phosphatase (APISO): 70 U/L (ref 36–130)
BUN/Creatinine Ratio: 11 (calc) (ref 6–22)
BUN: 14 mg/dL (ref 7–25)
CO2: 26 mmol/L (ref 20–32)
Calcium: 9.4 mg/dL (ref 8.6–10.3)
Chloride: 108 mmol/L (ref 98–110)
Creat: 1.29 mg/dL — ABNORMAL HIGH (ref 0.60–1.26)
Globulin: 3 g/dL (calc) (ref 1.9–3.7)
Glucose, Bld: 85 mg/dL (ref 65–99)
Potassium: 4 mmol/L (ref 3.5–5.3)
Sodium: 141 mmol/L (ref 135–146)
Total Bilirubin: 0.4 mg/dL (ref 0.2–1.2)
Total Protein: 7.4 g/dL (ref 6.1–8.1)
eGFR: 74 mL/min/{1.73_m2} (ref >=60)

## 2022-12-26 LAB — HIV-1 INTEGRASE GENOTYPE

## 2022-12-26 LAB — HIV RNA, RTPCR W/R GT (RTI, PI,INT)
HIV 1 RNA Quant: 2210 copies/mL — ABNORMAL HIGH
HIV-1 RNA Quant, Log: 3.34 Log copies/mL — ABNORMAL HIGH

## 2022-12-26 LAB — RPR: RPR Ser Ql: REACTIVE — AB

## 2022-12-26 LAB — HIV-1 GENOTYPE: HIV-1 Genotype: DETECTED — AB

## 2022-12-26 LAB — RPR TITER: RPR Titer: 1:1 {titer} — ABNORMAL HIGH

## 2022-12-26 LAB — T PALLIDUM AB: T Pallidum Abs: POSITIVE — AB

## 2023-01-11 ENCOUNTER — Ambulatory Visit (INDEPENDENT_AMBULATORY_CARE_PROVIDER_SITE_OTHER): Payer: No Typology Code available for payment source | Admitting: Infectious Disease

## 2023-01-11 DIAGNOSIS — I10 Essential (primary) hypertension: Secondary | ICD-10-CM

## 2023-01-11 DIAGNOSIS — Z113 Encounter for screening for infections with a predominantly sexual mode of transmission: Secondary | ICD-10-CM

## 2023-01-11 DIAGNOSIS — R590 Localized enlarged lymph nodes: Secondary | ICD-10-CM

## 2023-01-11 DIAGNOSIS — B20 Human immunodeficiency virus [HIV] disease: Secondary | ICD-10-CM | POA: Diagnosis not present

## 2023-01-11 DIAGNOSIS — E559 Vitamin D deficiency, unspecified: Secondary | ICD-10-CM

## 2023-01-11 DIAGNOSIS — Z7185 Encounter for immunization safety counseling: Secondary | ICD-10-CM

## 2023-01-11 NOTE — Progress Notes (Unsigned)
Subjective:  Chief complaint: follow-up for HIV disease on medications   Patient ID: Clifford Shields, male    DOB: Aug 11, 1986, 37 y.o.   MRN: [id number]  HPI  63 -year-old black man living with HIV over 20 years now who has had intermittent adherence to antiretroviral therapy and more lead to making his appointments and making sure that he renews his HIV medication assistance program.  He had been previously on envoy though we do not have any evidence of virological failure with [place] resistance we do need to keep this possibility in mind.  He ultimately was enrolled into the [facility] STUDY and made it through to be randomized to injectable and did receive injectable Cabenuva x 1.  Unfortunately he developed swelling at one of the injection sites which she dislikes.  He said he actually did like the fact that he got the injectables and did not have to remember taking pills.  He then came out of study.  He has been intermittently adherent to [facility] since then.  .    Past Medical History:  Diagnosis Date   HIV disease (HCC)    Hypertension    Marijuana use    Poor dentition 02/27/2021    Past Surgical History:  Procedure Laterality Date   ORIF TIBIA PLATEAU Left 03/28/2015   Procedure: OPEN REDUCTION INTERNAL FIXATION (ORIF) LEFT TIBIAL PLATEAU FRACTURE;  Surgeon: Myrene Galas, MD;  Location: [facility] OR;  Service: Orthopedics;  Laterality: Left;    Family History  Problem Relation Age of Onset   Kidney disease Mother    Hypertension Mother    Kidney disease Father    Hypertension Father    CVA Father 41      Social History   Socioeconomic History   Marital status: Media planner    Spouse name: Not on file   Number of children: 0   Years of education: Not on file   Highest education level: Not on file  Occupational History   Not on file  Tobacco Use   Smoking status: Every Day    Packs/day: 0.50    Years: 15.00    Additional pack years: 0.00    Total pack  years: 7.50    Types: Cigarettes    Passive exposure: Never   Smokeless tobacco: Never  Vaping Use   Vaping Use: Never used  Substance and Sexual Activity   Alcohol use: Yes    Alcohol/week: 0.0 standard drinks of alcohol    Comment: rarely   Drug use: Not Currently    Frequency: 14.0 times per week    Types: Marijuana   Sexual activity: Yes    Partners: Male    Birth control/protection: Condom    Comment: given condoms  Other Topics Concern   Not on file  Social History Narrative   Not on file   Social Determinants of Health   Financial Resource Strain: Low Risk  (08/22/2019)   Overall Financial Resource Strain (CARDIA)    Difficulty of Paying Living Expenses: Not hard at all  Food Insecurity: No Food Insecurity (08/22/2019)   Hunger Vital Sign    Worried About Running Out of Food in the Last Year: Never true    Ran Out of Food in the Last Year: Never true  Transportation Needs: No Transportation Needs (08/22/2019)   PRAPARE - Administrator, Civil Service (Medical): No    Lack of Transportation (Non-Medical): No  Physical Activity: Insufficiently Active (08/22/2019)   Exercise Vital Sign  Days of Exercise per Week: 3 days    Minutes of Exercise per Session: 30 min  Stress: No Stress Concern Present (08/22/2019)   [employer] of Occupational Health - Occupational Stress Questionnaire    Feeling of Stress : Only a little  Social Connections: Unknown (08/22/2019)   Social Connection and Isolation Panel [NHANES]    Frequency of Communication with Friends and Family: Three times a week    Frequency of Social Gatherings with Friends and Family: Twice a week    Attends Religious Services: Patient declined    Database administrator or Organizations: Patient declined    Attends Banker Meetings: Patient declined    Marital Status: Living with partner    Allergies  Allergen Reactions   Doxycycline Itching and Rash    Has had similar reaction  twice with doxycycline resolves with discontinuation-develops into blisters.     Current Outpatient Medications:    amLODipine (NORVASC) 10 MG tablet, Take 1 tablet (10 mg total) by mouth daily., Disp: 30 tablet, Rfl: 11   benzonatate (TESSALON) 100 MG capsule, Take 1 capsule (100 mg total) by mouth every 8 (eight) hours., Disp: 21 capsule, Rfl: 0   bictegravir-emtricitabine-tenofovir AF (BIKTARVY) 50-200-25 MG TABS tablet, Take 1 tablet by mouth daily., Disp: 30 tablet, Rfl: 6   lisinopril (ZESTRIL) 20 MG tablet, TAKE 1 TABLET(20 MG) BY MOUTH DAILY, Disp: 30 tablet, Rfl: 11   predniSONE (DELTASONE) 20 MG tablet, Take 2 tablets (40 mg total) by mouth daily., Disp: 10 tablet, Rfl: 0   promethazine-dextromethorphan (PROMETHAZINE-DM) 6.25-15 MG/5ML syrup, Take 5 mLs by mouth 4 (four) times daily as needed for cough., Disp: 118 mL, Rfl: 0   Review of Systems     Objective:   Physical Exam        Assessment & Plan:   HIV disease:  I will add order HIV viral load CD4 count CBC with differential CMP, RPR GC and chlamydia and I will continue  [facility]  prescription   Hypertension: currently filled lisinopril recently but ? Amlodiipine   Vaccine counseling; recommended up date COVID 19 vaccine

## 2023-03-24 ENCOUNTER — Ambulatory Visit: Payer: Self-pay | Admitting: Family

## 2023-03-25 ENCOUNTER — Telehealth: Payer: Self-pay

## 2023-03-25 NOTE — Telephone Encounter (Signed)
Detectable Viral Load Intervention   Most recent VL:  HIV 1 RNA Quant  Date Value Ref Range Status  12/09/2022 2,210 (H) copies/mL Final  04/17/2022 NOT DETECTED copies/mL Final  08/20/2021 Not Detected Copies/mL Final    Last Clinic Visit: 12/09/22  Current ART regimen: Biktarvy  Appointment status: patient does not have future appointment scheduled   Medication last dispensed (per chart review):   Dispensed Days Supply Quantity Provider Pharmacy  BIKTARVY 50/200/25MG  TABLETS 03/05/2023 30 30 each Daiva Eves, Lisette Grinder, MD [facility] #...  BIKTARVY 50/200/25MG  TABLETS 12/09/2022 30 30 each Daiva Eves, Lisette Grinder, MD [facility]ORE #...  BIKTARVY 50/200/25MG  TABLETS 08/15/2022 30 30 each Jennette Kettle, RPH-CPP [facility] DR[facility] STORE #...     Interventions   Called patient to discuss medication adherence and possible barriers to care.  No answer. Left HIPAA compliant voicemail requesting callback.   Sandie Ano, RN

## 2023-07-12 ENCOUNTER — Ambulatory Visit: Payer: Self-pay | Admitting: Infectious Disease

## 2023-07-14 ENCOUNTER — Telehealth: Payer: Self-pay

## 2023-07-14 NOTE — Telephone Encounter (Signed)
Detectable Viral Load Intervention (DVL)  Most recent VL:  HIV 1 RNA Quant  Date Value Ref Range Status  12/09/2022 2,210 (H) copies/mL Final  04/17/2022 NOT DETECTED copies/mL Final  08/20/2021 Not Detected Copies/mL Final    Last Clinic Visit: 01/11/23  Current ART regimen: Biktarvy  Appointment status: patient does not have future appointment scheduled   Medication last dispensed (per chart review):   Dispensed Days Supply Quantity Provider Pharmacy  BIKTARVY 50/200/25MG  TABLETS 04/30/2023 30 30 each Daiva Eves, Lisette Grinder, MD [facility] #...    Medication Adherence  Not able to assess   Barriers to Care   Not able to assess.   Interventions   Called patient to discuss medication adherence and possible barriers to care. Will send mychart message.  Juanita Laster, RMA

## 2023-08-31 DIAGNOSIS — Z809 Family history of malignant neoplasm, unspecified: Secondary | ICD-10-CM | POA: Diagnosis not present

## 2023-08-31 DIAGNOSIS — I10 Essential (primary) hypertension: Secondary | ICD-10-CM | POA: Diagnosis not present

## 2023-08-31 DIAGNOSIS — Z59819 Housing instability, housed unspecified: Secondary | ICD-10-CM | POA: Diagnosis not present

## 2023-08-31 DIAGNOSIS — Z6830 Body mass index (BMI) 30.0-30.9, adult: Secondary | ICD-10-CM | POA: Diagnosis not present

## 2023-08-31 DIAGNOSIS — Z881 Allergy status to other antibiotic agents status: Secondary | ICD-10-CM | POA: Diagnosis not present

## 2023-08-31 DIAGNOSIS — Z8249 Family history of ischemic heart disease and other diseases of the circulatory system: Secondary | ICD-10-CM | POA: Diagnosis not present

## 2023-08-31 DIAGNOSIS — B20 Human immunodeficiency virus [HIV] disease: Secondary | ICD-10-CM | POA: Diagnosis not present

## 2023-08-31 DIAGNOSIS — E669 Obesity, unspecified: Secondary | ICD-10-CM | POA: Diagnosis not present

## 2023-08-31 DIAGNOSIS — Z833 Family history of diabetes mellitus: Secondary | ICD-10-CM | POA: Diagnosis not present

## 2023-08-31 DIAGNOSIS — Z720 Tobacco use: Secondary | ICD-10-CM | POA: Diagnosis not present

## 2023-08-31 DIAGNOSIS — Z791 Long term (current) use of non-steroidal anti-inflammatories (NSAID): Secondary | ICD-10-CM | POA: Diagnosis not present

## 2023-08-31 DIAGNOSIS — Z823 Family history of stroke: Secondary | ICD-10-CM | POA: Diagnosis not present

## 2023-10-27 ENCOUNTER — Ambulatory Visit: Payer: Self-pay

## 2023-10-27 ENCOUNTER — Other Ambulatory Visit: Payer: 59

## 2023-10-27 ENCOUNTER — Other Ambulatory Visit: Payer: Self-pay

## 2023-10-27 DIAGNOSIS — Z79899 Other long term (current) drug therapy: Secondary | ICD-10-CM

## 2023-10-27 DIAGNOSIS — B20 Human immunodeficiency virus [HIV] disease: Secondary | ICD-10-CM

## 2023-10-27 DIAGNOSIS — Z113 Encounter for screening for infections with a predominantly sexual mode of transmission: Secondary | ICD-10-CM

## 2023-10-28 LAB — URINE CYTOLOGY ANCILLARY ONLY
Chlamydia: NEGATIVE
Comment: NEGATIVE
Comment: NORMAL
Neisseria Gonorrhea: NEGATIVE

## 2023-10-28 LAB — T-HELPER CELL (CD4) - (RCID CLINIC ONLY)
CD4 % Helper T Cell: 34 % (ref 33–65)
CD4 T Cell Abs: 509 /uL (ref 400–1790)

## 2023-10-29 ENCOUNTER — Telehealth: Payer: Self-pay

## 2023-10-29 NOTE — Telephone Encounter (Signed)
Attempted to call patient regarding results. Not able to reach him at this time. Left vm requesting call back. Juanita Laster, RMA

## 2023-10-29 NOTE — Telephone Encounter (Signed)
-----   Message from [facility] sent at 10/29/2023 10:57 AM EST ----- Regarding: FW: Since titer went up he will need 2.4 MU of bicillin ----- Message ----- From: Janace Hoard Lab Results In Sent: 10/27/2023  10:44 PM EST To: [name], MD

## 2023-10-30 LAB — CBC WITH DIFFERENTIAL/PLATELET
Absolute Lymphocytes: 1615 {cells}/uL (ref 850–3900)
Absolute Monocytes: 440 {cells}/uL (ref 200–950)
Basophils Absolute: 22 {cells}/uL (ref 0–200)
Basophils Relative: 0.5 %
Eosinophils Absolute: 211 {cells}/uL (ref 15–500)
Eosinophils Relative: 4.8 %
HCT: 39.5 % (ref 38.5–50.0)
Hemoglobin: 12.5 g/dL — ABNORMAL LOW (ref 13.2–17.1)
MCH: 25.8 pg — ABNORMAL LOW (ref 27.0–33.0)
MCHC: 31.6 g/dL — ABNORMAL LOW (ref 32.0–36.0)
MCV: 81.4 fL (ref 80.0–100.0)
MPV: 11.2 fL (ref 7.5–12.5)
Monocytes Relative: 10 %
Neutro Abs: 2112 {cells}/uL (ref 1500–7800)
Neutrophils Relative %: 48 %
Platelets: 232 10*3/uL (ref 140–400)
RBC: 4.85 10*6/uL (ref 4.20–5.80)
RDW: 13.2 % (ref 11.0–15.0)
Total Lymphocyte: 36.7 %
WBC: 4.4 10*3/uL (ref 3.8–10.8)

## 2023-10-30 LAB — COMPLETE METABOLIC PANEL WITH GFR
AG Ratio: 1 (calc) (ref 1.0–2.5)
ALT: 24 U/L (ref 9–46)
AST: 20 U/L (ref 10–40)
Albumin: 4.1 g/dL (ref 3.6–5.1)
Alkaline phosphatase (APISO): 70 U/L (ref 36–130)
BUN: 9 mg/dL (ref 7–25)
CO2: 29 mmol/L (ref 20–32)
Calcium: 9.3 mg/dL (ref 8.6–10.3)
Chloride: 106 mmol/L (ref 98–110)
Creat: 1.2 mg/dL (ref 0.60–1.26)
Globulin: 4 g/dL — ABNORMAL HIGH (ref 1.9–3.7)
Glucose, Bld: 92 mg/dL (ref 65–99)
Potassium: 3.8 mmol/L (ref 3.5–5.3)
Sodium: 140 mmol/L (ref 135–146)
Total Bilirubin: 0.3 mg/dL (ref 0.2–1.2)
Total Protein: 8.1 g/dL (ref 6.1–8.1)
eGFR: 80 mL/min/{1.73_m2} (ref >=60)

## 2023-10-30 LAB — RPR TITER: RPR Titer: 1:4 {titer} — ABNORMAL HIGH

## 2023-10-30 LAB — HIV-1 RNA QUANT-NO REFLEX-BLD
HIV 1 RNA Quant: 36900 {copies}/mL — ABNORMAL HIGH
HIV-1 RNA Quant, Log: 4.57 {Log} — ABNORMAL HIGH

## 2023-10-30 LAB — RPR: RPR Ser Ql: REACTIVE — AB

## 2023-10-30 LAB — LIPID PANEL
Cholesterol: 162 mg/dL (ref <200)
HDL: 30 mg/dL — ABNORMAL LOW (ref >=40)
LDL Cholesterol (Calc): 95 mg/dL
Non-HDL Cholesterol (Calc): 132 mg/dL — ABNORMAL HIGH (ref <130)
Total CHOL/HDL Ratio: 5.4 (calc) — ABNORMAL HIGH (ref <5.0)
Triglycerides: 247 mg/dL — ABNORMAL HIGH (ref <150)

## 2023-10-30 LAB — T PALLIDUM AB: T Pallidum Abs: POSITIVE — AB

## 2023-11-01 ENCOUNTER — Other Ambulatory Visit: Payer: Self-pay

## 2023-11-01 ENCOUNTER — Telehealth: Payer: Self-pay

## 2023-11-01 ENCOUNTER — Ambulatory Visit (INDEPENDENT_AMBULATORY_CARE_PROVIDER_SITE_OTHER): Payer: 59

## 2023-11-01 DIAGNOSIS — A539 Syphilis, unspecified: Secondary | ICD-10-CM | POA: Diagnosis not present

## 2023-11-01 DIAGNOSIS — B20 Human immunodeficiency virus [HIV] disease: Secondary | ICD-10-CM

## 2023-11-01 MED ORDER — BICTEGRAVIR-EMTRICITAB-TENOFOV 50-200-25 MG PO TABS
1.0000 | ORAL_TABLET | Freq: Every day | ORAL | 0 refills | Status: DC
Start: 2023-11-01 — End: 2023-11-08

## 2023-11-01 MED ORDER — PENICILLIN G BENZATHINE 1200000 UNIT/2ML IM SUSY
1.2000 10*6.[IU] | PREFILLED_SYRINGE | Freq: Once | INTRAMUSCULAR | Status: AC
Start: 2023-11-01 — End: 2023-11-01
  Administered 2023-11-01: 1.2 10*6.[IU] via INTRAMUSCULAR

## 2023-11-01 NOTE — Addendum Note (Signed)
Addended by: Linna Hoff D on: 11/01/2023 01:01 PM   Modules accepted: Orders

## 2023-11-01 NOTE — Telephone Encounter (Signed)
Clifford Shields came in for Bicillin injections today. Has been out of medication for about 2 months. Per chart review, last dispensed 04/30/23. Uses [facility] on [address]. Has appointment with Dr. Daiva Eves 2/24.  Sandie Ano, RN

## 2023-11-01 NOTE — Telephone Encounter (Signed)
Called patient, relayed that RPR titer increased and that Dr. Daiva Eves would like to treat him with Bicillin. He will come in this morning for treatment.   Sandie Ano, RN

## 2023-11-01 NOTE — Telephone Encounter (Signed)
Called Stacy, no answer. Left HIPAA compliant voicemail stating refill of medication was sent and to call with any questions.   Sandie Ano, RN

## 2023-11-01 NOTE — Progress Notes (Signed)
Nurse Visit  Clifford Shields [birth year]   Allergies  Allergen Reactions   Doxycycline Itching and Rash    Has had similar reaction twice with doxycycline resolves with discontinuation-develops into blisters.     Reviewed allergies with patient.   Medications administered: Bicillin 2.4 million units IM  Immunizations administered: none  Offered condoms. Patient not currently having sex.   Patient tolerated well.   Sandie Ano, RN

## 2023-11-08 ENCOUNTER — Encounter: Payer: Self-pay | Admitting: Infectious Disease

## 2023-11-08 ENCOUNTER — Ambulatory Visit (INDEPENDENT_AMBULATORY_CARE_PROVIDER_SITE_OTHER): Payer: Medicaid Other | Admitting: Infectious Disease

## 2023-11-08 ENCOUNTER — Other Ambulatory Visit: Payer: Self-pay

## 2023-11-08 VITALS — BP 152/108 | HR 105 | Temp 98.3°F | Wt 214.0 lb

## 2023-11-08 DIAGNOSIS — I10 Essential (primary) hypertension: Secondary | ICD-10-CM | POA: Diagnosis not present

## 2023-11-08 DIAGNOSIS — Z113 Encounter for screening for infections with a predominantly sexual mode of transmission: Secondary | ICD-10-CM | POA: Diagnosis not present

## 2023-11-08 DIAGNOSIS — B20 Human immunodeficiency virus [HIV] disease: Secondary | ICD-10-CM

## 2023-11-08 DIAGNOSIS — Z7185 Encounter for immunization safety counseling: Secondary | ICD-10-CM

## 2023-11-08 MED ORDER — BICTEGRAVIR-EMTRICITAB-TENOFOV 50-200-25 MG PO TABS: 1.0000 | ORAL_TABLET | Freq: Every day | ORAL | 11 refills | Status: DC

## 2023-11-08 MED ORDER — LISINOPRIL 20 MG PO TABS: ORAL_TABLET | ORAL | 11 refills | Status: DC

## 2023-11-08 MED ORDER — AMLODIPINE BESYLATE 10 MG PO TABS: 10.0000 mg | ORAL_TABLET | Freq: Every day | ORAL | 11 refills | Status: DC

## 2023-11-08 NOTE — Progress Notes (Signed)
 Subjective:   Chief complaint: follow-up for HIV disease on medications    Patient ID: Clifford Shields, male    DOB: June 27, 1986, 38 y.o.   MRN: [id number]  HPI  Discussed the use of AI scribe software for clinical note transcription with the patient, who gave verbal consent to proceed.  History of Present Illness   The patient, a 38 year old individual living with HIV, presents today after a period of self-discontinuation of his antiretroviral therapy (Biktarvy) and antihypertensive medications (lisinopril and amlodipine) for several months. He reports feeling better off the medications, but acknowledges the importance of managing his HIV and high blood pressure. He has been dealing with depression, which he attributes to the side effects of his medications and the impact of living with a chronic illness. He also reports bone problems, specifically cracking and popping in his legs, which he believes may be related to his long-term use of antiretroviral therapy. He has not received a flu or COVID-19 vaccine and is not interested in receiving them at this time.   He is indeed viremic again but without a low CD4 count. Bp is not well controlled but he is willing to start all of these medications again.      Past Medical History:  Diagnosis Date   HIV disease (HCC)    Hypertension    Marijuana use    Poor dentition 02/27/2021    Past Surgical History:  Procedure Laterality Date   ORIF TIBIA PLATEAU Left 03/28/2015   Procedure: OPEN REDUCTION INTERNAL FIXATION (ORIF) LEFT TIBIAL PLATEAU FRACTURE;  Surgeon: Myrene Galas, MD;  Location: [facility] OR;  Service: Orthopedics;  Laterality: Left;    Family History  Problem Relation Age of Onset   Kidney disease Mother    Hypertension Mother    Kidney disease Father    Hypertension Father    CVA Father 36      Social History   Socioeconomic History   Marital status: Media planner    Spouse name: Not on file   Number of children: 0    Years of education: Not on file   Highest education level: Not on file  Occupational History   Not on file  Tobacco Use   Smoking status: Every Day    Current packs/day: 0.50    Average packs/day: 0.5 packs/day for 15.0 years (7.5 ttl pk-yrs)    Types: Cigarettes    Passive exposure: Never   Smokeless tobacco: Never  Vaping Use   Vaping status: Never Used  Substance and Sexual Activity   Alcohol use: Yes    Alcohol/week: 0.0 standard drinks of alcohol    Comment: rarely   Drug use: Not Currently    Frequency: 14.0 times per week    Types: Marijuana   Sexual activity: Yes    Partners: Male    Birth control/protection: Condom    Comment: given condoms  Other Topics Concern   Not on file  Social History Narrative   Not on file   Social Drivers of Health   Financial Resource Strain: Low Risk  (08/22/2019)   Overall Financial Resource Strain (CARDIA)    Difficulty of Paying Living Expenses: Not hard at all  Food Insecurity: No Food Insecurity (08/22/2019)   Hunger Vital Sign    Worried About Running Out of Food in the Last Year: Never true    Ran Out of Food in the Last Year: Never true  Transportation Needs: No Transportation Needs (08/22/2019)   PRAPARE - Transportation  Lack of Transportation (Medical): No    Lack of Transportation (Non-Medical): No  Physical Activity: Insufficiently Active (08/22/2019)   Exercise Vital Sign    Days of Exercise per Week: 3 days    Minutes of Exercise per Session: 30 min  Stress: No Stress Concern Present (08/22/2019)   [employer] of Occupational Health - Occupational Stress Questionnaire    Feeling of Stress : Only a little  Social Connections: Unknown (08/22/2019)   Social Connection and Isolation Panel [NHANES]    Frequency of Communication with Friends and Family: Three times a week    Frequency of Social Gatherings with Friends and Family: Twice a week    Attends Religious Services: Patient declined    Database administrator  or Organizations: Patient declined    Attends Banker Meetings: Patient declined    Marital Status: Living with partner    Allergies  Allergen Reactions   Doxycycline Itching and Rash    Has had similar reaction twice with doxycycline resolves with discontinuation-develops into blisters.     Current Outpatient Medications:    amLODipine (NORVASC) 10 MG tablet, Take 1 tablet (10 mg total) by mouth daily. (Patient not taking: Reported on 11/08/2023), Disp: 30 tablet, Rfl: 11   benzonatate (TESSALON) 100 MG capsule, Take 1 capsule (100 mg total) by mouth every 8 (eight) hours. (Patient not taking: Reported on 11/08/2023), Disp: 21 capsule, Rfl: 0   bictegravir-emtricitabine-tenofovir AF (BIKTARVY) 50-200-25 MG TABS tablet, Take 1 tablet by mouth daily. (Patient not taking: Reported on 11/08/2023), Disp: 30 tablet, Rfl: 0   lisinopril (ZESTRIL) 20 MG tablet, TAKE 1 TABLET(20 MG) BY MOUTH DAILY (Patient not taking: Reported on 11/08/2023), Disp: 30 tablet, Rfl: 11   predniSONE (DELTASONE) 20 MG tablet, Take 2 tablets (40 mg total) by mouth daily. (Patient not taking: Reported on 11/08/2023), Disp: 10 tablet, Rfl: 0   promethazine-dextromethorphan (PROMETHAZINE-DM) 6.25-15 MG/5ML syrup, Take 5 mLs by mouth 4 (four) times daily as needed for cough. (Patient not taking: Reported on 11/08/2023), Disp: 118 mL, Rfl: 0   Review of Systems  Constitutional:  Negative for activity change, appetite change, chills, diaphoresis, fatigue, fever and unexpected weight change.  HENT:  Negative for congestion, rhinorrhea, sinus pressure, sneezing, sore throat and trouble swallowing.   Eyes:  Negative for photophobia and visual disturbance.  Respiratory:  Negative for cough, chest tightness, shortness of breath, wheezing and stridor.   Cardiovascular:  Negative for chest pain, palpitations and leg swelling.  Gastrointestinal:  Negative for abdominal distention, abdominal pain, anal bleeding, blood in  stool, constipation, diarrhea, nausea and vomiting.  Genitourinary:  Negative for difficulty urinating, dysuria, flank pain and hematuria.  Musculoskeletal:  Negative for arthralgias, back pain, gait problem, joint swelling and myalgias.  Skin:  Negative for color change, pallor, rash and wound.  Neurological:  Negative for dizziness, tremors, weakness and light-headedness.  Hematological:  Negative for adenopathy. Does not bruise/bleed easily.  Psychiatric/Behavioral:  Negative for agitation, behavioral problems, confusion, decreased concentration, dysphoric mood and sleep disturbance.        Objective:   Physical Exam Constitutional:      Appearance: He is well-developed.  HENT:     Head: Normocephalic and atraumatic.  Eyes:     Conjunctiva/sclera: Conjunctivae normal.  Cardiovascular:     Rate and Rhythm: Normal rate and regular rhythm.  Pulmonary:     Effort: Pulmonary effort is normal. No respiratory distress.     Breath sounds: No wheezing.  Abdominal:  General: There is no distension.     Palpations: Abdomen is soft.  Musculoskeletal:        General: No tenderness. Normal range of motion.     Cervical back: Normal range of motion and neck supple.  Skin:    General: Skin is warm and dry.     Coloration: Skin is not pale.     Findings: No erythema or rash.  Neurological:     General: No focal deficit present.     Mental Status: He is alert and oriented to person, place, and time.  Psychiatric:        Mood and Affect: Mood normal.        Behavior: Behavior normal.        Thought Content: Thought content normal.        Judgment: Judgment normal.           Assessment & Plan:   Assessment and Plan    HIV Off antiretroviral therapy (ART) for several months with a recent viral load of 36,900. CD4 count remains in a healthy range (509). Previous intolerance to Cabenuva injections with development of severe injection site reactions.  -Check for resistance today   -Plan to restart Biktarvy  -Consider Dovato as a future option if patient is interested in a two-drug regimen and resistance testing allows.  Hypertension Off lisinopril for over a month. -Resume lisinopril and amlodipine -Consider monitoring blood pressure regularly.  Bone Health Patient reports cracking and popping in leg with a history of a leg fracture. Previous exposure to tenofovir disoproxil fumarate (TDF) which may have contributed to reduced bone mineral density. -Check vitamin D levels today. -Encourage resistance training for bone health.  Preventive Health History of receptive anal intercourse, increasing risk for anal cancer due to potential HPV exposure. -Offer anal Pap smear for cancer prevention. -Encourage flu vaccination, COVID vaccinations .

## 2023-11-09 ENCOUNTER — Telehealth: Payer: Self-pay

## 2023-11-09 NOTE — Telephone Encounter (Signed)
-----   Message from [place] sent at 11/09/2023  8:31 AM EST ----- Clifford Shields's vitamin d levels were a little low. He could start takign 50K units ONCE WEEKLY for the next 3 months (over the counter) ----- Message ----- From: Interface, [facility] Lab Results In Sent: 11/09/2023   6:18 AM EST To: [name], MD

## 2023-11-09 NOTE — Telephone Encounter (Signed)
 Left voicemail asking patient to return my call.   Yvonne Stopher Lesli Albee, CMA

## 2023-11-10 ENCOUNTER — Other Ambulatory Visit: Payer: Self-pay

## 2023-11-10 DIAGNOSIS — B20 Human immunodeficiency virus [HIV] disease: Secondary | ICD-10-CM

## 2023-11-10 MED ORDER — BICTEGRAVIR-EMTRICITAB-TENOFOV 50-200-25 MG PO TABS
1.0000 | ORAL_TABLET | Freq: Every day | ORAL | 11 refills | Status: DC
Start: 2023-11-10 — End: 2024-02-08

## 2023-11-10 MED ORDER — LISINOPRIL 20 MG PO TABS: ORAL_TABLET | ORAL | 11 refills | Status: DC

## 2023-11-10 MED ORDER — VITAMIN D (ERGOCALCIFEROL) 1.25 MG (50000 UNIT) PO CAPS: 50000.0000 [IU] | ORAL_CAPSULE | ORAL | 2 refills | Status: DC

## 2023-11-10 NOTE — Telephone Encounter (Signed)
 Patient aware. RX for Vitamin D 50K 1 tablet once weekly for 3 months has been sent to [facility] [place].   Patient stated that he was unable to pick up Lisinopril from [place] on [address]. Patient has [employer] so will [facility] RX sent as well. Patient is aware and voiced his understanding.    Ajanay Farve Lesli Albee, CMA

## 2023-11-16 ENCOUNTER — Ambulatory Visit: Payer: Self-pay | Admitting: Registered Nurse

## 2023-11-16 NOTE — Telephone Encounter (Signed)
 Chief Complaint: leg pain Symptoms: leg pain, leg swelling Frequency: intermittently for 10 years  Pertinent Negatives: Patient denies fever, SOB, signs of infection, skin color change, swelling today Disposition: [] ED /[x] Urgent Care (no appt availability in office) / [] Appointment(In office/virtual)/ []  [facility]/ [] Home Care/ [] Refused Recommended Disposition /[] [facility]/ []  Follow-up with PCP Additional Notes: Pt reports L leg pain and swelling intermittently for 10 years. Pt suffered a tibial plateau fracture to that leg 10 yrs ago, repaired with "three screws." Pt states since then he has had difficulty with that leg and states it is getting worse. Pt denies swelling today, but states when he stands on that leg for long periods of time it becomes very swollen. Leg was very swollen at his new job 2 weeks ago so he had to quit his job. Pt states this makes keeping a job difficult. Pt states the leg is always painful. Pt states the pain now is a 2/10, but sometimes it is a 10/10 with walking. Denies a knot under the skin, redness (pt is dark-skinned), signs of infection, skin that is warm to the touch. RN asked pt about SOB and CP, pt states he has both "periodically" and states these symptoms are worse when he is very tired. Pt states he has felt fatigued lately despite getting enough sleep. Pt states he has smoked cigarettes since he was 15-16 years old but has recently switched to vaping. Pt states "I am accustomed to my chest always feeling a little tight." He rates that tightness 1/10 today, states this is not new. Pt looking to become established with a PCP. Pt inquiring about [facility] RN scheduled pt to be seen as a new pt at [facility] on 3/28 at 1540. Pt agreeable to that plan. In the meantime, for pt's leg symptoms, RN advised pt he should go to UC to be evaluated. RN provided pt with the name of the nearest UC, about 7 miles from pt's current location. Pt  states his car does not run well but he will try to make it there. RN advised pt that if he becomes SOB, or his chronic chest tightness changes in nature or severity, or if his leg pain worsens, he needs to call 911 and go to the ED. He verbalized understanding.   Copied from CRM [id number]. Topic: Clinical - Red Word Triage >> Nov 16, 2023  2:08 PM Dennison Nancy wrote: Red Word that prompted transfer to Nurse Triage: issues with leg had surgery some time ago have swelling in left leg Reason for Disposition  [1] Thigh or calf pain AND [2] only 1 side AND [3] present > 1 hour (Exception: Chronic unchanged pain.)  Answer Assessment - Initial Assessment Questions 1. ONSET: "When did the swelling start?" (e.g., minutes, hours, days)     "Comes and goes", "today it is fine", "I quit my job because I was standing around and it swelled double the size", swelling has decreased with rest  2. LOCATION: "What part of the leg is swollen?"  "Are both legs swollen or just one leg?"     L leg 3. SEVERITY: "How bad is the swelling?" (e.g., localized; mild, moderate, severe)   - Localized: Small area of swelling localized to one leg.   - MILD pedal edema: Swelling limited to foot and ankle, pitting edema < 1/4 inch (6 mm) deep, rest and elevation eliminate most or all swelling.   - MODERATE edema: Swelling of lower leg to knee,  pitting edema > 1/4 inch (6 mm) deep, rest and elevation only partially reduce swelling.   - SEVERE edema: Swelling extends above knee, facial or hand swelling present.      "It is not swelling, but it is always in pain", 2-3/10 pain currently (way worse with walking, "feels like whole leg is ready to cave"), getting worse 4. REDNESS: "Does the swelling look red or infected?"     "I am dark-skinned so I don't get red", no signs of infection 5. PAIN: "Is the swelling painful to touch?" If Yes, ask: "How painful is it?"   (Scale 1-10; mild, moderate or severe)     2-3/10 pain always, worse with  walking, getting worse since fracture repair 10 yrs ago 6. FEVER: "Do you have a fever?" If Yes, ask: "What is it, how was it measured, and when did it start?"      No 7. CAUSE: "What do you think is causing the leg swelling?"     Fracture 10 yrs ago  8. MEDICAL HISTORY: "Do you have a history of blood clots (e.g., DVT), cancer, heart failure, kidney disease, or liver failure?"     Tibial plateau fracture, "3 screws" 9. RECURRENT SYMPTOM: "Have you had leg swelling before?" If Yes, ask: "When was the last time?" "What happened that time?"     On and off for 10 years 10. OTHER SYMPTOMS: "Do you have any other symptoms?" (e.g., chest pain, difficulty breathing)       Tibial plateau fracture 10 yrs ago, no redness (dark-skinned), no knot underneath the skin, no signs of infection, "just swells really bad" (last swollen 2 weeks ago at work, pt quit his job), "a couple of times the R leg had swollen really bad, but both of them were swollen at the time"  Answer Assessment - Initial Assessment Questions 1. ONSET: "When did the pain start?"      Intermittently for 10 years 2. LOCATION: "Where is the pain located?"      L leg 3. PAIN: "How bad is the pain?"    (Scale 1-10; or mild, moderate, severe)   -  MILD (1-3): doesn't interfere with normal activities    -  MODERATE (4-7): interferes with normal activities (e.g., work or school) or awakens from sleep, limping    -  SEVERE (8-10): excruciating pain, unable to do any normal activities, unable to walk     2/10, worse with walking (10/10) 4. WORK OR EXERCISE: "Has there been any recent work or exercise that involved this part of the body?"      Standing and working makes pain worse, exacerbates swelling 5. CAUSE: "What do you think is causing the leg pain?"     Fracture 10 yrs ago 6. OTHER SYMPTOMS: "Do you have any other symptoms?" (e.g., chest pain, back pain, breathing difficulty, swelling, rash, fever, numbness, weakness)     Endorses CP and  SOB "periodically", CP when he is tired which he states is often lately, "I think I get too much rest and think I wake up tired", (recently started vaping to replace cigarettes), no fever, no swelling at this time (leg does swell often with standing), no redness (patient is dark-skinned), states he is accustomed to chest feeling "a little tight" (then states it is tight now, rates it 1/10), smoking cigarettes since he was 67-14 yrs old, smoking for over 20 yrs  Protocols used: Leg Swelling and Edema-A-AH, Leg Pain-A-AH

## 2023-11-19 LAB — HIV RNA, RTPCR W/R GT (RTI, PI,INT)
HIV 1 RNA Quant: 76700 {copies}/mL — ABNORMAL HIGH
HIV-1 RNA Quant, Log: 4.88 {Log_copies}/mL — ABNORMAL HIGH

## 2023-11-19 LAB — HIV-1 INTEGRASE GENOTYPE

## 2023-11-19 LAB — VITAMIN D 25 HYDROXY (VIT D DEFICIENCY, FRACTURES): Vit D, 25-Hydroxy: 13 ng/mL — ABNORMAL LOW (ref 30–100)

## 2023-11-19 LAB — HIV-1 GENOTYPE: HIV-1 Genotype: DETECTED — AB

## 2023-12-10 ENCOUNTER — Ambulatory Visit: Admitting: Pediatrics

## 2023-12-10 ENCOUNTER — Encounter: Payer: Self-pay | Admitting: Pediatrics

## 2023-12-10 VITALS — BP 162/102 | HR 81 | Temp 98.3°F | Ht 69.0 in | Wt 216.2 lb

## 2023-12-10 DIAGNOSIS — Z13220 Encounter for screening for lipoid disorders: Secondary | ICD-10-CM

## 2023-12-10 DIAGNOSIS — I10 Essential (primary) hypertension: Secondary | ICD-10-CM | POA: Diagnosis not present

## 2023-12-10 DIAGNOSIS — Z7689 Persons encountering health services in other specified circumstances: Secondary | ICD-10-CM

## 2023-12-10 DIAGNOSIS — Z1330 Encounter for screening examination for mental health and behavioral disorders, unspecified: Secondary | ICD-10-CM

## 2023-12-10 DIAGNOSIS — M25562 Pain in left knee: Secondary | ICD-10-CM

## 2023-12-10 DIAGNOSIS — B20 Human immunodeficiency virus [HIV] disease: Secondary | ICD-10-CM

## 2023-12-10 DIAGNOSIS — M79642 Pain in left hand: Secondary | ICD-10-CM | POA: Diagnosis not present

## 2023-12-10 DIAGNOSIS — G8929 Other chronic pain: Secondary | ICD-10-CM

## 2023-12-10 DIAGNOSIS — Z131 Encounter for screening for diabetes mellitus: Secondary | ICD-10-CM

## 2023-12-10 MED ORDER — CELECOXIB 100 MG PO CAPS
100.0000 mg | ORAL_CAPSULE | Freq: Two times a day (BID) | ORAL | 0 refills | Status: AC
Start: 2023-12-10 — End: 2023-12-24

## 2023-12-10 MED ORDER — OLMESARTAN MEDOXOMIL 20 MG PO TABS
20.0000 mg | ORAL_TABLET | Freq: Every day | ORAL | 2 refills | Status: DC
Start: 2023-12-10 — End: 2024-02-09

## 2023-12-10 NOTE — Progress Notes (Signed)
 Establish Care Note  BP (!) 162/102   Pulse 81   Temp 98.3 F (36.8 C) (Oral)   Ht 5\' 9"  (1.753 m)   Wt 216 lb 3.2 oz (98.1 kg)   SpO2 99%   BMI 31.93 kg/m    Subjective:    Patient ID: Clifford Shields, male    DOB: 06/14/1986, 38 y.o.   MRN: [id number]  HPI: Clifford Shields is a 38 y.o. male  Chief Complaint  Patient presents with   Establish Care    Pt states has been having a lot of leg pain lately broke legs years ago     Establishing care, the following was discussed today:  Discussed the use of AI scribe software for clinical note transcription with the patient, who gave verbal consent to proceed.  History of Present Illness   Clifford Buresh "[name]" is a 38 year old male with a history of tibia and fibula fracture who presents with chronic knee pain and swelling.  Clifford Shields has been experiencing chronic knee pain and swelling, particularly exacerbated by standing for extended periods at work or after resting. The pain is localized to the knee, often accompanied by cracking sounds and sensitivity in specific areas. Clifford Shields describes the knee as feeling hot and constantly painful, significantly impacting his ability to work full shifts. The pain has been consistent for the past four to five years, with a notable worsening over the last two years. An episode two months ago involved significant leg swelling while at work. No recent imaging of the knee has been performed since the surgery.  Approximately eight years ago, Clifford Shields sustained a tibia and fibula fracture that required surgical intervention. Clifford Shields has not undergone physical therapy post-surgery and is concerned that the fracture may not have healed properly, contributing to his ongoing discomfort and swelling.  For pain management, Clifford Shields uses Epsom salt soaks and ibuprofen for swelling, though ibuprofen does not alleviate the pain. Resting the leg provides some relief, but the pain returns with activity.  Clifford Shields has a history of high  blood pressure since age 5, with a family history of hypertension affecting his parents and grandparents. Clifford Shields is not currently taking any medication for hypertension but has been prescribed lisinopril in the past.  Clifford Shields has a history of HIV, diagnosed at age 59, and is currently on [place]. Clifford Shields is concerned about past medications potentially contributing to bone thinning, as Clifford Shields experienced a fracture while running. Clifford Shields is not currently taking vitamin D supplements.        Current Outpatient Medications on File Prior to Visit  Medication Sig Dispense Refill   bictegravir-emtricitabine-tenofovir AF (BIKTARVY) 50-200-25 MG TABS tablet Take 1 tablet by mouth daily. 30 tablet 11   amLODipine (NORVASC) 10 MG tablet Take 1 tablet (10 mg total) by mouth daily. (Patient not taking: Reported on 12/10/2023) 30 tablet 11   benzonatate (TESSALON) 100 MG capsule Take 1 capsule (100 mg total) by mouth every 8 (eight) hours. (Patient not taking: Reported on 12/10/2023) 21 capsule 0   predniSONE (DELTASONE) 20 MG tablet Take 2 tablets (40 mg total) by mouth daily. (Patient not taking: Reported on 12/10/2023) 10 tablet 0   promethazine-dextromethorphan (PROMETHAZINE-DM) 6.25-15 MG/5ML syrup Take 5 mLs by mouth 4 (four) times daily as needed for cough. (Patient not taking: Reported on 12/10/2023) 118 mL 0   Vitamin D, Ergocalciferol, (DRISDOL) 1.25 MG (50000 UNIT) CAPS capsule Take 1 capsule (50,000 Units total) by mouth every 7 (seven) days. (Patient not taking:  Reported on 12/10/2023) 4 capsule 2   No current facility-administered medications on file prior to visit.    #HM Will review HM records and updated as needed.  Relevant past medical, surgical, family and social history reviewed and updated as indicated. Interim medical history since our last visit reviewed. Allergies and medications reviewed and updated.  ROS per HPI unless specifically indicated above     Objective:    BP (!) 162/102    Pulse 81   Temp 98.3 F (36.8 C) (Oral)   Ht 5\' 9"  (1.753 m)   Wt 216 lb 3.2 oz (98.1 kg)   SpO2 99%   BMI 31.93 kg/m   Wt Readings from Last 3 Encounters:  12/10/23 216 lb 3.2 oz (98.1 kg)  11/08/23 214 lb (97.1 kg)  12/09/22 220 lb (99.8 kg)     Physical Exam Constitutional:      Appearance: Normal appearance.  HENT:     Head: Normocephalic and atraumatic.  Eyes:     Pupils: Pupils are equal, round, and reactive to light.  Cardiovascular:     Rate and Rhythm: Normal rate and regular rhythm.     Pulses: Normal pulses.     Heart sounds: Normal heart sounds.  Pulmonary:     Effort: Pulmonary effort is normal.     Breath sounds: Normal breath sounds.  Musculoskeletal:     Cervical back: Normal range of motion.     Left knee: Decreased range of motion. Tenderness present. No LCL laxity, MCL laxity, ACL laxity or PCL laxity.    Comments: No focal tenderness, Clifford Shields did limp after visit  Skin:    General: Skin is warm and dry.     Capillary Refill: Capillary refill takes less than 2 seconds.  Neurological:     General: No focal deficit present.     Mental Status: Clifford Shields is alert. Mental status is at baseline.  Psychiatric:        Mood and Affect: Mood normal.        Behavior: Behavior normal.         12/10/2023    3:34 PM 11/08/2023    2:36 PM 12/09/2022    4:19 PM 02/27/2021   11:30 AM 01/29/2021   11:21 AM  Depression screen PHQ 2/9  Decreased Interest 3 0 0 0 0  Down, Depressed, Hopeless 3 0 0 0 0  PHQ - 2 Score 6 0 0 0 0  Altered sleeping 3      Tired, decreased energy 3      Change in appetite 3      Feeling bad or failure about yourself  3      Trouble concentrating 3      Moving slowly or fidgety/restless 1      Suicidal thoughts 0      PHQ-9 Score 22      Difficult doing work/chores Extremely dIfficult            12/10/2023    3:34 PM  GAD 7 : Generalized Anxiety Score  Nervous, Anxious, on Edge 3  Control/stop worrying 3  Worry too much - different things  3  Trouble relaxing 3  Restless 3  Easily annoyed or irritable 1  Afraid - awful might happen 2  Total GAD 7 Score 18  Anxiety Difficulty Very difficult       Assessment & Plan:  Assessment & Plan   Primary hypertension Assessment & Plan: Long-standing hypertension with family history. Previous lisinopril not  filled. Discussed olmesartan for better side effect profile. Emphasized importance of monitoring blood pressure. - Check blood pressure at next visit. - Consider prescribing olmesartan if blood pressure remains elevated. - Order kidney function tests to rule out secondary causes.  Orders: -     Olmesartan Medoxomil; Take 1 tablet (20 mg total) by mouth daily.  Dispense: 30 tablet; Refill: 2 -     Comprehensive metabolic panel with GFR  Human immunodeficiency virus (HIV) disease (HCC) Assessment & Plan: Managed on Biktarvy, follows with ID. Previous medications may have affected bone health. Discussed long-term management and monitoring of bone health in context of HIV treatment history. Bone fractures possibly related to previous HIV medication.  Discussed bone health optimization with vitamin D and calcium.   Chronic pain of left knee Assessment & Plan: Chronic right knee pain likely due to arthritis or malalignment from previous fracture. Potential for arthritis due to chronic changes and bone growth. - Order x-ray of right knee to assess for arthritis or structural abnormalities. - Prescribe Celebrex 200 mg, one tablet twice daily. - Advise against mixing Celebrex with ibuprofen; Tylenol is permissible. - Consider injection therapy if arthritis is confirmed.  Orders: -     Celecoxib; Take 1 capsule (100 mg total) by mouth 2 (two) times daily for 14 days.  Dispense: 28 capsule; Refill: 0 -     DG Knee Complete 4 Views Left; Future  Left hand pain Discussed toard end of visit. Plan to assess with xray given high fracture risk as discussed above. -     Celecoxib; Take  1 capsule (100 mg total) by mouth 2 (two) times daily for 14 days.  Dispense: 28 capsule; Refill: 0 -     DG Hand Complete Left; Future  Encounter to establish care Reviewed available patient record including history, medications, problem list. HM updated as able. Will review and/or request outside records (if applicable) and will fill remaining HM gaps as needed at follow up visit.  Encounter for behavioral health screening As part of their intake evaluation, the patient was screened for depression, anxiety.  PHQ9 SCORE 22, GAD7 SCORE 18. Screening results positive for tested conditions. Will further address at follow up.  Diabetes mellitus screening -     Hemoglobin A1c  Lipid screening -     Lipid panel  Follow up plan: Return in about 2 weeks (around 12/24/2023) for HTN, xray.  Jackolyn Confer, MD

## 2023-12-10 NOTE — Patient Instructions (Addendum)
 Celebrex 100mg  twice daily  - tylenol  - ibuprofen    Xray address: [facility] [address] [place],  [place]  [address]

## 2023-12-11 LAB — COMPREHENSIVE METABOLIC PANEL WITH GFR
ALT: 30 IU/L (ref 0–44)
AST: 28 IU/L (ref 0–40)
Albumin: 4.3 g/dL (ref 4.1–5.1)
Alkaline Phosphatase: 75 IU/L (ref 44–121)
BUN/Creatinine Ratio: 8 — ABNORMAL LOW (ref 9–20)
BUN: 13 mg/dL (ref 6–20)
Bilirubin Total: 0.2 mg/dL (ref 0.0–1.2)
CO2: 24 mmol/L (ref 20–29)
Calcium: 9.2 mg/dL (ref 8.7–10.2)
Chloride: 103 mmol/L (ref 96–106)
Creatinine, Ser: 1.55 mg/dL — ABNORMAL HIGH (ref 0.76–1.27)
Globulin, Total: 3.7 g/dL (ref 1.5–4.5)
Glucose: 89 mg/dL (ref 70–99)
Potassium: 4.2 mmol/L (ref 3.5–5.2)
Sodium: 141 mmol/L (ref 134–144)
Total Protein: 8 g/dL (ref 6.0–8.5)
eGFR: 59 mL/min/{1.73_m2} — ABNORMAL LOW (ref >59)

## 2023-12-11 LAB — LIPID PANEL
Chol/HDL Ratio: 5.4 ratio — ABNORMAL HIGH (ref 0.0–5.0)
Cholesterol, Total: 188 mg/dL (ref 100–199)
HDL: 35 mg/dL — ABNORMAL LOW (ref >39)
LDL Chol Calc (NIH): 114 mg/dL — ABNORMAL HIGH (ref 0–99)
Triglycerides: 222 mg/dL — ABNORMAL HIGH (ref 0–149)
VLDL Cholesterol Cal: 39 mg/dL (ref 5–40)

## 2023-12-11 LAB — HEMOGLOBIN A1C
Est. average glucose Bld gHb Est-mCnc: 117 mg/dL
Hgb A1c MFr Bld: 5.7 % — ABNORMAL HIGH (ref 4.8–5.6)

## 2023-12-13 NOTE — Progress Notes (Deleted)
 Subjective:    Patient ID: Clifford Shields, male    DOB: 1985-11-11, 38 y.o.   MRN: [id number]  HPI     Past Medical History:  Diagnosis Date   HIV disease (HCC)    Hypertension    Marijuana use    Poor dentition 02/27/2021    Past Surgical History:  Procedure Laterality Date   ORIF TIBIA PLATEAU Left 03/28/2015   Procedure: OPEN REDUCTION INTERNAL FIXATION (ORIF) LEFT TIBIAL PLATEAU FRACTURE;  Surgeon: Myrene Galas, MD;  Location: [facility] OR;  Service: Orthopedics;  Laterality: Left;    Family History  Problem Relation Age of Onset   Kidney disease Mother    Hypertension Mother    Kidney disease Father    Hypertension Father    CVA Father 73      Social History   Socioeconomic History   Marital status: Media planner    Spouse name: Not on file   Number of children: 0   Years of education: Not on file   Highest education level: Not on file  Occupational History   Not on file  Tobacco Use   Smoking status: Every Day    Current packs/day: 0.50    Average packs/day: 0.5 packs/day for 15.0 years (7.5 ttl pk-yrs)    Types: Cigarettes    Passive exposure: Never   Smokeless tobacco: Never  Vaping Use   Vaping status: Never Used  Substance and Sexual Activity   Alcohol use: Yes    Alcohol/week: 0.0 standard drinks of alcohol    Comment: rarely   Drug use: Not Currently    Frequency: 14.0 times per week    Types: Marijuana   Sexual activity: Yes    Partners: Male    Birth control/protection: Condom    Comment: given condoms  Other Topics Concern   Not on file  Social History Narrative   Not on file   Social Drivers of Health   Financial Resource Strain: Low Risk  (08/22/2019)   Overall Financial Resource Strain (CARDIA)    Difficulty of Paying Living Expenses: Not hard at all  Food Insecurity: No Food Insecurity (08/22/2019)   Hunger Vital Sign    Worried About Running Out of Food in the Last Year: Never true    Ran Out of Food in the Last Year: Never  true  Transportation Needs: No Transportation Needs (08/22/2019)   PRAPARE - Administrator, Civil Service (Medical): No    Lack of Transportation (Non-Medical): No  Physical Activity: Insufficiently Active (08/22/2019)   Exercise Vital Sign    Days of Exercise per Week: 3 days    Minutes of Exercise per Session: 30 min  Stress: No Stress Concern Present (08/22/2019)   [employer] of Occupational Health - Occupational Stress Questionnaire    Feeling of Stress : Only a little  Social Connections: Unknown (08/22/2019)   Social Connection and Isolation Panel [NHANES]    Frequency of Communication with Friends and Family: Three times a week    Frequency of Social Gatherings with Friends and Family: Twice a week    Attends Religious Services: Patient declined    Database administrator or Organizations: Patient declined    Attends Banker Meetings: Patient declined    Marital Status: Living with partner    Allergies  Allergen Reactions   Doxycycline Itching and Rash    Has had similar reaction twice with doxycycline resolves with discontinuation-develops into blisters.     Current Outpatient  Medications:    amLODipine (NORVASC) 10 MG tablet, Take 1 tablet (10 mg total) by mouth daily. (Patient not taking: Reported on 12/10/2023), Disp: 30 tablet, Rfl: 11   benzonatate (TESSALON) 100 MG capsule, Take 1 capsule (100 mg total) by mouth every 8 (eight) hours. (Patient not taking: Reported on 12/10/2023), Disp: 21 capsule, Rfl: 0   bictegravir-emtricitabine-tenofovir AF (BIKTARVY) 50-200-25 MG TABS tablet, Take 1 tablet by mouth daily., Disp: 30 tablet, Rfl: 11   celecoxib (CELEBREX) 100 MG capsule, Take 1 capsule (100 mg total) by mouth 2 (two) times daily for 14 days., Disp: 28 capsule, Rfl: 0   olmesartan (BENICAR) 20 MG tablet, Take 1 tablet (20 mg total) by mouth daily., Disp: 30 tablet, Rfl: 2   predniSONE (DELTASONE) 20 MG tablet, Take 2 tablets (40 mg total) by  mouth daily. (Patient not taking: Reported on 12/10/2023), Disp: 10 tablet, Rfl: 0   promethazine-dextromethorphan (PROMETHAZINE-DM) 6.25-15 MG/5ML syrup, Take 5 mLs by mouth 4 (four) times daily as needed for cough. (Patient not taking: Reported on 12/10/2023), Disp: 118 mL, Rfl: 0   Vitamin D, Ergocalciferol, (DRISDOL) 1.25 MG (50000 UNIT) CAPS capsule, Take 1 capsule (50,000 Units total) by mouth every 7 (seven) days. (Patient not taking: Reported on 12/10/2023), Disp: 4 capsule, Rfl: 2    Review of Systems     Objective:   Physical Exam        Assessment & Plan:

## 2023-12-14 ENCOUNTER — Ambulatory Visit: Payer: Self-pay | Admitting: Infectious Disease

## 2023-12-14 DIAGNOSIS — I10 Essential (primary) hypertension: Secondary | ICD-10-CM

## 2023-12-14 DIAGNOSIS — B20 Human immunodeficiency virus [HIV] disease: Secondary | ICD-10-CM

## 2023-12-17 ENCOUNTER — Ambulatory Visit
Admission: RE | Admit: 2023-12-17 | Discharge: 2023-12-17 | Disposition: A | Discharge status: Home / Self Care | Attending: Pediatrics | Admitting: Pediatrics

## 2023-12-17 ENCOUNTER — Ambulatory Visit
Admission: RE | Admit: 2023-12-17 | Discharge: 2023-12-17 | Disposition: A | Discharge status: Home / Self Care | Source: Ambulatory Visit | Attending: Pediatrics | Admitting: Pediatrics

## 2023-12-17 DIAGNOSIS — M25562 Pain in left knee: Secondary | ICD-10-CM | POA: Insufficient documentation

## 2023-12-17 DIAGNOSIS — M79642 Pain in left hand: Secondary | ICD-10-CM | POA: Insufficient documentation

## 2023-12-17 DIAGNOSIS — G8929 Other chronic pain: Secondary | ICD-10-CM | POA: Insufficient documentation

## 2023-12-20 ENCOUNTER — Encounter: Payer: Self-pay | Admitting: Pediatrics

## 2023-12-20 DIAGNOSIS — G8929 Other chronic pain: Secondary | ICD-10-CM | POA: Insufficient documentation

## 2023-12-20 NOTE — Assessment & Plan Note (Signed)
 Managed on Biktarvy, follows with ID. Previous medications may have affected bone health. Discussed long-term management and monitoring of bone health in context of HIV treatment history. Bone fractures possibly related to previous HIV medication.  Discussed bone health optimization with vitamin D and calcium.

## 2023-12-20 NOTE — Assessment & Plan Note (Signed)
 Long-standing hypertension with family history. Previous lisinopril not filled. Discussed olmesartan for better side effect profile. Emphasized importance of monitoring blood pressure. - Check blood pressure at next visit. - Consider prescribing olmesartan if blood pressure remains elevated. - Order kidney function tests to rule out secondary causes.

## 2023-12-20 NOTE — Assessment & Plan Note (Signed)
 Chronic right knee pain likely due to arthritis or malalignment from previous fracture. Potential for arthritis due to chronic changes and bone growth. - Order x-ray of right knee to assess for arthritis or structural abnormalities. - Prescribe Celebrex 200 mg, one tablet twice daily. - Advise against mixing Celebrex with ibuprofen; Tylenol is permissible. - Consider injection therapy if arthritis is confirmed.

## 2024-02-07 ENCOUNTER — Other Ambulatory Visit: Payer: Self-pay | Admitting: Infectious Disease

## 2024-02-07 DIAGNOSIS — B20 Human immunodeficiency virus [HIV] disease: Secondary | ICD-10-CM

## 2024-02-08 ENCOUNTER — Other Ambulatory Visit: Payer: Self-pay | Admitting: Pediatrics

## 2024-02-08 DIAGNOSIS — B20 Human immunodeficiency virus [HIV] disease: Secondary | ICD-10-CM

## 2024-02-08 NOTE — Telephone Encounter (Signed)
 Copied from CRM [id number]. Topic: Clinical - Medication Refill >> Feb 08, 2024  9:07 AM Earnestine Goes B wrote: Medication: bictegravir-emtricitabine -tenofovir  AF (BIKTARVY) 50-200-25 MG TABS table  Has the patient contacted their pharmacy? Yes (Agent: If no, request that the patient contact the pharmacy for the refill. If patient does not wish to contact the pharmacy document the reason why and proceed with request.) (Agent: If yes, when and what did the pharmacy advise?)  This is the patient's preferred pharmacy:    [id number] [place], [place] - [address] AT [facility] OF [address] [place] [address] [place] [address] Phone: [phone] Fax: [phone]  Is this the correct pharmacy for this prescription? Yes If no, delete pharmacy and type the correct one.   Has the prescription been filled recently? Yes  Is the patient out of the medication? Yes  Has the patient been seen for an appointment in the last year OR does the patient have an upcoming appointment? Yes  Can we respond through MyChart? Yes  Agent: Please be advised that Rx refills may take up to 3 business days. We ask that you follow-up with your pharmacy.

## 2024-02-09 ENCOUNTER — Ambulatory Visit
Admission: RE | Admit: 2024-02-09 | Discharge: 2024-02-09 | Disposition: A | Discharge status: Home / Self Care | Source: Ambulatory Visit | Attending: Family Medicine | Admitting: Family Medicine

## 2024-02-09 ENCOUNTER — Ambulatory Visit
Admission: RE | Admit: 2024-02-09 | Discharge: 2024-02-09 | Disposition: A | Discharge status: Home / Self Care | Source: Ambulatory Visit | Attending: Family Medicine

## 2024-02-09 ENCOUNTER — Ambulatory Visit: Admitting: Family Medicine

## 2024-02-09 ENCOUNTER — Encounter: Payer: Self-pay | Admitting: Family Medicine

## 2024-02-09 ENCOUNTER — Encounter: Payer: Self-pay | Admitting: Pediatrics

## 2024-02-09 VITALS — BP 158/108 | HR 80 | Ht 69.0 in | Wt 217.0 lb

## 2024-02-09 DIAGNOSIS — I10 Essential (primary) hypertension: Secondary | ICD-10-CM | POA: Diagnosis not present

## 2024-02-09 DIAGNOSIS — R202 Paresthesia of skin: Secondary | ICD-10-CM

## 2024-02-09 LAB — MICROALBUMIN, URINE WAIVED
Creatinine, Urine Waived: 200 mg/dL (ref 10–300)
Microalb, Ur Waived: 150 mg/L — ABNORMAL HIGH (ref 0–19)

## 2024-02-09 MED ORDER — OLMESARTAN MEDOXOMIL 40 MG PO TABS: 40.0000 mg | ORAL_TABLET | Freq: Every day | ORAL | 1 refills | Status: DC

## 2024-02-09 NOTE — Progress Notes (Signed)
 BP (!) 158/108   Pulse 80   Ht 5\' 9"  (1.753 m)   Wt 217 lb (98.4 kg)   SpO2 99%   BMI 32.05 kg/m    Subjective:    Patient ID: Clifford Shields, male    DOB: 01-27-1986, 38 y.o.   MRN: [id number]  HPI: Clifford Shields is a 38 y.o. male  Chief Complaint  Patient presents with   Numbness    Pt stated that he noticed last week that his fingertips on his right hand are going numb. Through the day worsens and by the end of the day, he is unable to feel his finger tips at all. Does have numbness and tingling in the right arm as well.  Does occur everyday. Father is currently in a coma after 2 strokes.    HYPERTENSION  Hypertension status: uncontrolled  Satisfied with current treatment? no Duration of hypertension: 3 months BP monitoring frequency:  rarely BP medication side effects:  no Medication compliance: good compliance Previous BP meds: olmesartan , amlodipine , lisinopril  Aspirin : no Recurrent headaches: no Visual changes: no Palpitations: no Dyspnea: no Chest pain: not pain but tightness Lower extremity edema: no Dizzy/lightheaded: no  NUMBNESS- has been having numbness in the first 2 fingers in his R hand. He note that it's a shooting sensation from his R shoulder into his hand Duration: months Onset: gradual Location: first 2 fingers on the R Bilateral: no Symmetric: no Decreased sensation: yes  Weakness: no Pain: no Quality:  numb and tingling Severity: severe  Frequency: constant Trauma: no Recent illness: no Diabetes: no Thyroid  disease: no  HIV: yes  Alcoholism: no  Spinal cord injury: no Status: uncontrolled Treatments attempted: nothing  Relevant past medical, surgical, family and social history reviewed and updated as indicated. Interim medical history since our last visit reviewed. Allergies and medications reviewed and updated.  Review of Systems  Constitutional: Negative.   Respiratory: Negative.    Cardiovascular: Negative.    Musculoskeletal: Negative.   Skin: Negative.   Neurological:  Positive for numbness. Negative for dizziness, tremors, seizures, syncope, facial asymmetry, speech difficulty, weakness, light-headedness and headaches.  Psychiatric/Behavioral: Negative.      Per HPI unless specifically indicated above     Objective:     BP (!) 158/108   Pulse 80   Ht 5\' 9"  (1.753 m)   Wt 217 lb (98.4 kg)   SpO2 99%   BMI 32.05 kg/m   Wt Readings from Last 3 Encounters:  02/09/24 217 lb (98.4 kg)  12/10/23 216 lb 3.2 oz (98.1 kg)  11/08/23 214 lb (97.1 kg)    Physical Exam Vitals and nursing note reviewed.  Constitutional:      General: He is not in acute distress.    Appearance: Normal appearance. He is not ill-appearing, toxic-appearing or diaphoretic.  HENT:     Head: Normocephalic and atraumatic.     Right Ear: External ear normal.     Left Ear: External ear normal.     Nose: Nose normal.     Mouth/Throat:     Mouth: Mucous membranes are moist.     Pharynx: Oropharynx is clear.  Eyes:     General: No scleral icterus.       Right eye: No discharge.        Left eye: No discharge.     Extraocular Movements: Extraocular movements intact.     Conjunctiva/sclera: Conjunctivae normal.     Pupils: Pupils are equal, round, and reactive to light.  Cardiovascular:     Rate and Rhythm: Normal rate and regular rhythm.     Pulses: Normal pulses.     Heart sounds: Normal heart sounds. No murmur heard.    No friction rub. No gallop.  Pulmonary:     Effort: Pulmonary effort is normal. No respiratory distress.     Breath sounds: Normal breath sounds. No stridor. No wheezing, rhonchi or rales.  Chest:     Chest wall: No tenderness.  Musculoskeletal:        General: Normal range of motion.     Cervical back: Normal range of motion and neck supple.     Comments: + tinnels to carpal tunnel on the R  Skin:    General: Skin is warm and dry.     Capillary Refill: Capillary refill takes less than  2 seconds.     Coloration: Skin is not jaundiced or pale.     Findings: No bruising, erythema, lesion or rash.  Neurological:     General: No focal deficit present.     Mental Status: He is alert and oriented to person, place, and time. Mental status is at baseline.  Psychiatric:        Mood and Affect: Mood normal.        Behavior: Behavior normal.        Thought Content: Thought content normal.        Judgment: Judgment normal.     Results for orders placed or performed in visit on 12/10/23  Comp Met (CMET)   Collection Time: 12/10/23  4:06 PM  Result Value Ref Range   Glucose 89 70 - 99 mg/dL   BUN 13 6 - 20 mg/dL   Creatinine, Ser 1.61 (H) 0.76 - 1.27 mg/dL   eGFR 59 (L) >09 UE/AVW/0.98   BUN/Creatinine Ratio 8 (L) 9 - 20   Sodium 141 134 - 144 mmol/L   Potassium 4.2 3.5 - 5.2 mmol/L   Chloride 103 96 - 106 mmol/L   CO2 24 20 - 29 mmol/L   Calcium 9.2 8.7 - 10.2 mg/dL   Total Protein 8.0 6.0 - 8.5 g/dL   Albumin 4.3 4.1 - 5.1 g/dL   Globulin, Total 3.7 1.5 - 4.5 g/dL   Bilirubin Total 0.2 0.0 - 1.2 mg/dL   Alkaline Phosphatase 75 44 - 121 IU/L   AST 28 0 - 40 IU/L   ALT 30 0 - 44 IU/L  Lipid Profile   Collection Time: 12/10/23  4:06 PM  Result Value Ref Range   Cholesterol, Total 188 100 - 199 mg/dL   Triglycerides 119 (H) 0 - 149 mg/dL   HDL 35 (L) >14 mg/dL   VLDL Cholesterol Cal 39 5 - 40 mg/dL   LDL Chol Calc (NIH) 782 (H) 0 - 99 mg/dL   Chol/HDL Ratio 5.4 (H) 0.0 - 5.0 ratio  HgB A1c   Collection Time: 12/10/23  4:06 PM  Result Value Ref Range   Hgb A1c MFr Bld 5.7 (H) 4.8 - 5.6 %   Est. average glucose Bld gHb Est-mCnc 117 mg/dL      Assessment & Plan:   Problem List Items Addressed This Visit       Cardiovascular and Mediastinum   Primary hypertension - Primary   Not under good control. Will increase his olmesartan  to 40mg  and recheck in 2 weeks. Call with any concerns.       Relevant Medications   olmesartan  (BENICAR ) 40 MG tablet   Other  Relevant Orders   Basic metabolic panel with GFR   Microalbumin, Urine Waived   Other Visit Diagnoses       Paresthesias       Concern for carpal tunnel. Will check labs and x-rays. Stretches given. Call with any concerns. Continue to monitor.   Relevant Orders   Basic metabolic panel with GFR   TSH   VITAMIN D  25 Hydroxy (Vit-D Deficiency, Fractures)   CBC with Differential/Platelet   B12   DG Shoulder Right   DG Cervical Spine Complete        Follow up plan: Return in about 2 weeks (around 02/23/2024) for with Dr. Juliette Oh.

## 2024-02-09 NOTE — Assessment & Plan Note (Signed)
 Not under good control. Will increase his olmesartan  to 40mg  and recheck in 2 weeks. Call with any concerns.

## 2024-02-10 LAB — CBC WITH DIFFERENTIAL/PLATELET
Basophils Absolute: 0 10*3/uL (ref 0.0–0.2)
Basos: 0 %
EOS (ABSOLUTE): 0.3 10*3/uL (ref 0.0–0.4)
Eos: 5 %
Hematocrit: 40.6 % (ref 37.5–51.0)
Hemoglobin: 12.6 g/dL — ABNORMAL LOW (ref 13.0–17.7)
Immature Grans (Abs): 0 10*3/uL (ref 0.0–0.1)
Immature Granulocytes: 0 %
Lymphocytes Absolute: 2.2 10*3/uL (ref 0.7–3.1)
Lymphs: 40 %
MCH: 26.6 pg (ref 26.6–33.0)
MCHC: 31 g/dL — ABNORMAL LOW (ref 31.5–35.7)
MCV: 86 fL (ref 79–97)
Monocytes Absolute: 0.4 10*3/uL (ref 0.1–0.9)
Monocytes: 6 %
Neutrophils Absolute: 2.6 10*3/uL (ref 1.4–7.0)
Neutrophils: 49 %
Platelets: 205 10*3/uL (ref 150–450)
RBC: 4.74 x10E6/uL (ref 4.14–5.80)
RDW: 14.8 % (ref 11.6–15.4)
WBC: 5.4 10*3/uL (ref 3.4–10.8)

## 2024-02-10 LAB — BASIC METABOLIC PANEL WITH GFR
BUN/Creatinine Ratio: 8 — ABNORMAL LOW (ref 9–20)
BUN: 9 mg/dL (ref 6–20)
CO2: 23 mmol/L (ref 20–29)
Calcium: 9.1 mg/dL (ref 8.7–10.2)
Chloride: 105 mmol/L (ref 96–106)
Creatinine, Ser: 1.1 mg/dL (ref 0.76–1.27)
Glucose: 89 mg/dL (ref 70–99)
Potassium: 4 mmol/L (ref 3.5–5.2)
Sodium: 142 mmol/L (ref 134–144)
eGFR: 89 mL/min/{1.73_m2} (ref >59)

## 2024-02-10 LAB — TSH: TSH: 1.23 u[IU]/mL (ref 0.450–4.500)

## 2024-02-10 LAB — VITAMIN D 25 HYDROXY (VIT D DEFICIENCY, FRACTURES): Vit D, 25-Hydroxy: 10.9 ng/mL — ABNORMAL LOW (ref 30.0–100.0)

## 2024-02-10 LAB — VITAMIN B12: Vitamin B-12: 349 pg/mL (ref 232–1245)

## 2024-02-10 NOTE — Telephone Encounter (Signed)
 BIKTARVY 50-200-25 MG TABS tablet 30 tablet 0 02/08/2024 --   Sig - Route: TAKE 1 TABLET BY MOUTH DAILY - Oral   Sent to pharmacy as: bictegravir-emtricitabine -tenofovir  AF (BIKTARVY) 50-200-25 MG Tab tablet   Notes to Pharmacy: Patient needs appointment   E-Prescribing Status: Receipt confirmed by pharmacy (02/08/2024  2:39 PM EDT)    Requested Prescriptions  Pending Prescriptions Disp Refills   bictegravir-emtricitabine -tenofovir  AF (BIKTARVY) 50-200-25 MG TABS tablet 30 tablet 11    Sig: Take 1 tablet by mouth daily.     Off-Protocol Failed - 02/10/2024  9:41 AM      Failed - Medication not assigned to a protocol, review manually.      Failed - Valid encounter within last 12 months    Recent Outpatient Visits           Yesterday Primary hypertension   [place] [facility], [place], DO   2 months ago Primary hypertension   [place] [facility] Hadassah Letters, MD   4 years ago Hypertension, unspecified type   [facility], Marguerita Shih, MD   4 years ago Physical exam   [facility], Rich Champ, NP

## 2024-02-21 ENCOUNTER — Ambulatory Visit: Payer: Self-pay | Admitting: Family Medicine

## 2024-02-21 MED ORDER — VITAMIN D (ERGOCALCIFEROL) 1.25 MG (50000 UNIT) PO CAPS: 50000.0000 [IU] | ORAL_CAPSULE | ORAL | 0 refills | Status: DC

## 2024-02-23 ENCOUNTER — Ambulatory Visit: Admitting: Pediatrics

## 2024-02-23 ENCOUNTER — Encounter: Payer: Self-pay | Admitting: Pediatrics

## 2024-02-23 VITALS — BP 163/97 | HR 76 | Temp 98.2°F | Wt 219.6 lb

## 2024-02-23 DIAGNOSIS — G8929 Other chronic pain: Secondary | ICD-10-CM

## 2024-02-23 DIAGNOSIS — I10 Essential (primary) hypertension: Secondary | ICD-10-CM

## 2024-02-23 DIAGNOSIS — M25562 Pain in left knee: Secondary | ICD-10-CM | POA: Diagnosis not present

## 2024-02-23 DIAGNOSIS — B20 Human immunodeficiency virus [HIV] disease: Secondary | ICD-10-CM | POA: Diagnosis not present

## 2024-02-23 DIAGNOSIS — G5601 Carpal tunnel syndrome, right upper limb: Secondary | ICD-10-CM | POA: Diagnosis not present

## 2024-02-23 DIAGNOSIS — G5600 Carpal tunnel syndrome, unspecified upper limb: Secondary | ICD-10-CM | POA: Insufficient documentation

## 2024-02-23 MED ORDER — OLMESARTAN MEDOXOMIL 40 MG PO TABS: 40.0000 mg | ORAL_TABLET | Freq: Every day | ORAL | 1 refills | Status: DC

## 2024-02-23 MED ORDER — BIKTARVY 50-200-25 MG PO TABS: 1.0000 | ORAL_TABLET | Freq: Every day | ORAL | 0 refills | Status: DC

## 2024-02-23 NOTE — Progress Notes (Signed)
 Office Visit  BP (!) 163/97   Pulse 76   Temp 98.2 F (36.8 C) (Oral)   Wt 219 lb 9.6 oz (99.6 kg)   SpO2 100%   BMI 32.43 kg/m    Subjective:    Patient ID: Clifford Shields, male    DOB: 06-Feb-1986, 38 y.o.   MRN: [id number]  HPI: Clifford Shields is a 38 y.o. male  Chief Complaint  Patient presents with   Numbness    Started 3 weeks ago losing feeling in hand    Discussed the use of AI scribe software for clinical note transcription with the patient, who gave verbal consent to proceed.  History of Present Illness   Clifford Shields is a 38 year old male who presents with numbness in the hand, suspected to be carpal tunnel syndrome.  He experiences numbness in the hand, specifically from the wrist upwards, which feels muted rather than completely numb. He can feel touch but describes it as different from normal sensation. No visible swelling is present, but he acknowledges internal numbness. This numbness was previously evaluated, and carpal tunnel syndrome was discussed as a possibility.  He has a history of difficulty obtaining his medication, Biktarvy, due to insurance issues and is currently out of the medication. He has been on the same dose for a long time. There was a mix-up at the pharmacy where he was given two blood pressure medications, olmesartan  and lisinopril , instead of his usual olmesartan  and Celebrex  for knee pain. He has not taken the lisinopril  as he noticed the error before use.  He experiences knee pain for which he was prescribed Celebrex . He is unsure if the medication is effective, possibly due to his long work hours of ten hours a day, which may exacerbate the pain.  He mentions a family history of high blood pressure, noting that his father is in a vegetative state, which he suspects might be related to blood pressure medication.        Relevant past medical, surgical, family and social history reviewed and updated as indicated.  Interim medical history since our last visit reviewed. Allergies and medications reviewed and updated.  ROS per HPI unless specifically indicated above     Objective:     BP (!) 163/97   Pulse 76   Temp 98.2 F (36.8 C) (Oral)   Wt 219 lb 9.6 oz (99.6 kg)   SpO2 100%   BMI 32.43 kg/m   Wt Readings from Last 3 Encounters:  02/23/24 219 lb 9.6 oz (99.6 kg)  02/09/24 217 lb (98.4 kg)  12/10/23 216 lb 3.2 oz (98.1 kg)     Physical Exam Constitutional:      Appearance: Normal appearance.  Pulmonary:     Effort: Pulmonary effort is normal.  Musculoskeletal:        General: Normal range of motion.     Comments: Ring finger on right hand with numbness, though reports able to feel gross touch  Skin:    Comments: Normal skin color  Neurological:     General: No focal deficit present.     Mental Status: He is alert. Mental status is at baseline.  Psychiatric:        Mood and Affect: Mood normal.        Behavior: Behavior normal.        Thought Content: Thought content normal.         02/23/2024   10:32 AM 12/10/2023    3:34 PM 11/08/2023  2:36 PM 12/09/2022    4:19 PM 02/27/2021   11:30 AM  Depression screen PHQ 2/9  Decreased Interest 3 3 0 0 0  Down, Depressed, Hopeless 3 3 0 0 0  PHQ - 2 Score 6 6 0 0 0  Altered sleeping 3 3     Tired, decreased energy 3 3     Change in appetite 3 3     Feeling bad or failure about yourself  3 3     Trouble concentrating 3 3     Moving slowly or fidgety/restless 0 1     Suicidal thoughts  0     PHQ-9 Score 21 22     Difficult doing work/chores Extremely dIfficult Extremely dIfficult          02/23/2024   10:32 AM 12/10/2023    3:34 PM  GAD 7 : Generalized Anxiety Score  Nervous, Anxious, on Edge 3 3  Control/stop worrying 3 3  Worry too much - different things 3 3  Trouble relaxing 3 3  Restless 3 3  Easily annoyed or irritable 3 1  Afraid - awful might happen 3 2  Total GAD 7 Score 21 18  Anxiety Difficulty Extremely  difficult Very difficult       Assessment & Plan:  Assessment & Plan   Carpal tunnel syndrome of right wrist Assessment & Plan: Numbness and muted sensation due to median nerve compression. Prefers non-invasive treatments. - Recommend carpal tunnel brace. - Reassess in six weeks if no improvement.   Primary hypertension Assessment & Plan: On olmesartan . Pharmacy error dispensed lisinopril  instead. He will resume olmesartan . - Resend olmesartan  prescription to pharmacy. - Instruct him to report lisinopril  error.  Orders: -     Olmesartan  Medoxomil; Take 1 tablet (40 mg total) by mouth daily.  Dispense: 30 tablet; Refill: 1  Human immunodeficiency virus (HIV) disease (HCC) Assessment & Plan: On Biktarvy. Pharmacy issues caused adherence lapse. Coordination needed for continuous supply. - sending bridge Biktarvy prescription until ID can follow up   Orders: -     Biktarvy; Take 1 tablet by mouth daily.  Dispense: 30 tablet; Refill: 0  Chronic pain of left knee Assessment & Plan: Knee pain with occasional buckling. Celebrex  ineffective. Prefers non-invasive treatments. - Discontinue Celebrex .      Follow up plan: Return in about 6 weeks (around 04/05/2024) for HTN.  Hadassah Letters, MD

## 2024-02-23 NOTE — Assessment & Plan Note (Signed)
 On olmesartan . Pharmacy error dispensed lisinopril  instead. He will resume olmesartan . - Resend olmesartan  prescription to pharmacy. - Instruct him to report lisinopril  error.

## 2024-02-23 NOTE — Assessment & Plan Note (Signed)
 Numbness and muted sensation due to median nerve compression. Prefers non-invasive treatments. - Recommend carpal tunnel brace. - Reassess in six weeks if no improvement.

## 2024-02-23 NOTE — Assessment & Plan Note (Signed)
 Knee pain with occasional buckling. Celebrex  ineffective. Prefers non-invasive treatments. - Discontinue Celebrex .

## 2024-02-23 NOTE — Assessment & Plan Note (Addendum)
 On Biktarvy. Pharmacy issues caused adherence lapse. Coordination needed for continuous supply. - sending bridge Biktarvy prescription until ID can follow up

## 2024-02-24 ENCOUNTER — Other Ambulatory Visit (HOSPITAL_COMMUNITY): Payer: Self-pay

## 2024-02-24 ENCOUNTER — Telehealth: Payer: Self-pay

## 2024-02-24 NOTE — Progress Notes (Signed)
 Per test claim: Refill too soon. PA is not needed at this time. Medication was filled 02/23/2024. Next eligible fill date is 03/16/2024.

## 2024-02-24 NOTE — Telephone Encounter (Signed)
 Pharmacy Patient Advocate Encounter   Received notification from Physician's Office that prior authorization for Biktary 50-200-25 mg tabs is required/requested.   Insurance verification completed.   The patient is insured through [employer] .   Per test claim: Refill too soon. PA is not needed at this time. Medication was filled 02/23/2024. Next eligible fill date is 03/16/2024.

## 2024-02-28 NOTE — Progress Notes (Signed)
Sent a My Chart Message

## 2024-04-11 ENCOUNTER — Ambulatory Visit: Admitting: Pediatrics

## 2024-05-17 ENCOUNTER — Telehealth: Payer: Self-pay

## 2024-05-17 NOTE — Telephone Encounter (Signed)
 [name], cares about your health, and our records show that we may not have seen you since 11/08/2023. The general recommended frequency of visits for your health is every 6 months or as otherwise directed by your provider. Please call [phone] to schedule an appointment.

## 2024-06-06 ENCOUNTER — Encounter: Payer: Self-pay | Admitting: Pediatrics

## 2024-06-06 ENCOUNTER — Ambulatory Visit: Admitting: Pediatrics

## 2024-06-06 VITALS — BP 179/121 | HR 81 | Temp 98.1°F | Wt 218.8 lb

## 2024-06-06 DIAGNOSIS — F649 Gender identity disorder, unspecified: Secondary | ICD-10-CM | POA: Diagnosis not present

## 2024-06-06 DIAGNOSIS — I10 Essential (primary) hypertension: Secondary | ICD-10-CM

## 2024-06-06 DIAGNOSIS — Z113 Encounter for screening for infections with a predominantly sexual mode of transmission: Secondary | ICD-10-CM

## 2024-06-06 DIAGNOSIS — B20 Human immunodeficiency virus [HIV] disease: Secondary | ICD-10-CM

## 2024-06-06 MED ORDER — OLMESARTAN MEDOXOMIL 40 MG PO TABS: 40.0000 mg | ORAL_TABLET | Freq: Every day | ORAL | 3 refills | Status: AC

## 2024-06-06 MED ORDER — SPIRONOLACTONE 100 MG PO TABS: 100.0000 mg | ORAL_TABLET | Freq: Every day | ORAL | 1 refills | Status: DC

## 2024-06-06 NOTE — Assessment & Plan Note (Signed)
 He has not taken Biktarvy  for the past month and prefers to continue management with the current provider. Will fill pending repeat viral load.  - Refill Biktarvy  prescription with additional refills to prevent future lapses. - Perform laboratory tests including STD testing.  - May need to see specialist if needing further dose adjustment - no recent viral load

## 2024-06-06 NOTE — Patient Instructions (Addendum)
 Blood pressure: - continue olmesartan  40mg  daily  For gender affriming care: - spironolactone  100mg  daily

## 2024-06-06 NOTE — Assessment & Plan Note (Signed)
 Pt is considering hormone therapy for gender transition, preferring oral medications. - Initiate spironolactone  to block testosterone  and reduce hair growth. - Consider adding oral estrogen after assessing response to spironolactone . - Schedule follow-up in one month to assess response, with video consultation option.

## 2024-06-06 NOTE — Progress Notes (Signed)
 Office Visit  BP (!) 179/121   Pulse 81   Temp 98.1 F (36.7 C) (Oral)   Wt 218 lb 12.8 oz (99.2 kg)   SpO2 98%   BMI 32.31 kg/m    Subjective:    Patient ID: Clifford Shields, male    DOB: March 15, 1986, 38 y.o.   MRN: [id number]  HPI: Clifford Shields is a 38 y.o. male  Chief Complaint  Patient presents with   Follow-up    Discussed the use of AI scribe software for clinical note transcription with the patient, who gave verbal consent to proceed.  History of Present Illness   Clifford Shields is a 38 year old who presents for initiation of hormone therapy and medication refill.  They are interested in starting hormone therapy for gender transition, specifically seeking oral medications to achieve changes such as reduced hair growth and a softer appearance. They were not previously been on hormone therapy and is exploring options for gender-affirming care.  They have run out of medications, including olmesartan  for hypertension and Biktarvy  for HIV, and has not taken them for the past month. Pt experienced 'little white specks' which his mother suggested could be related to high blood pressure. Has not seen an infectious disease doctor recently and is comfortable with us  managing his medication needs. Pt ran out of Biktarvy  at the same time as olmesartan  and has not taken it since.  Relevant past medical, surgical, family and social history reviewed and updated as indicated. Interim medical history since our last visit reviewed. Allergies and medications reviewed and updated.  ROS per HPI unless specifically indicated above     Objective:    BP (!) 179/121   Pulse 81   Temp 98.1 F (36.7 C) (Oral)   Wt 218 lb 12.8 oz (99.2 kg)   SpO2 98%   BMI 32.31 kg/m   Wt Readings from Last 3 Encounters:  06/06/24 218 lb 12.8 oz (99.2 kg)  02/23/24 219 lb 9.6 oz (99.6 kg)  02/09/24 217 lb (98.4 kg)     Physical Exam Constitutional:      Appearance: Normal  appearance.  Pulmonary:     Effort: Pulmonary effort is normal.  Musculoskeletal:        General: Normal range of motion.  Skin:    Comments: Normal skin color  Neurological:     General: No focal deficit present.     Mental Status: He is alert. Mental status is at baseline.  Psychiatric:        Mood and Affect: Mood normal.        Behavior: Behavior normal.        Thought Content: Thought content normal.         06/06/2024    9:06 AM 02/23/2024   10:32 AM 12/10/2023    3:34 PM 11/08/2023    2:36 PM 12/09/2022    4:19 PM  Depression screen PHQ 2/9  Decreased Interest 2 3 3  0 0  Down, Depressed, Hopeless 2 3 3  0 0  PHQ - 2 Score 4 6 6  0 0  Altered sleeping 2 3 3     Tired, decreased energy 2 3 3     Change in appetite 2 3 3     Feeling bad or failure about yourself  2 3 3     Trouble concentrating 2 3 3     Moving slowly or fidgety/restless 2 0 1    Suicidal thoughts 2  0    PHQ-9 Score 18 21  22    Difficult doing work/chores Somewhat difficult Extremely dIfficult Extremely dIfficult         06/06/2024    9:06 AM 02/23/2024   10:32 AM 12/10/2023    3:34 PM  GAD 7 : Generalized Anxiety Score  Nervous, Anxious, on Edge 2 3 3   Control/stop worrying 2 3 3   Worry too much - different things 2 3 3   Trouble relaxing 2 3 3   Restless 2 3 3   Easily annoyed or irritable 2 3 1   Afraid - awful might happen 2 3 2   Total GAD 7 Score 14 21 18   Anxiety Difficulty Somewhat difficult Extremely difficult Very difficult       Assessment & Plan:  Assessment & Plan   Primary hypertension Assessment & Plan: Pt has not taken olmesartan  for the past month, experiencing symptoms consistent with elevated blood pressure. Spironolactone  may aid in blood pressure control. - Restart olmesartan  40 mg daily. - Monitor blood pressure and assess response to spironolactone .  Orders: -     Olmesartan  Medoxomil; Take 1 tablet (40 mg total) by mouth daily.  Dispense: 90 tablet; Refill: 3 -      Hemoglobin A1c -     Comprehensive metabolic panel with GFR -     CBC with Differential/Platelet  Human immunodeficiency virus (HIV) disease (HCC) Assessment & Plan: P has not taken Biktarvy  for the past month and prefers to continue management with the current provider. Will fill pending repeat viral load.  - Refill Biktarvy  prescription with additional refills to prevent future lapses. - Perform laboratory tests including STD testing.  - May need to see specialist if needing further dose adjustment - no recent viral load  Orders: -     HIV-1 RNA quant-no reflex-bld  Gender dysphoria Assessment & Plan: Pt is considering hormone therapy for gender transition, preferring oral medications. - Initiate spironolactone  to block testosterone  and reduce hair growth. - Consider adding oral estrogen after assessing response to spironolactone . - Schedule follow-up in one month to assess response, with video consultation option.  Orders: -     Spironolactone ; Take 1 tablet (100 mg total) by mouth daily.  Dispense: 90 tablet; Refill: 1  Screen for STD (sexually transmitted disease) Requesting further testing. -     RPR w/reflex to TrepSure -     Chlamydia/Gonococcus/Trichomonas, NAA    Follow up plan: Return in about 4 weeks (around 07/04/2024).  Hadassah SHAUNNA Nett, MD

## 2024-06-06 NOTE — Assessment & Plan Note (Signed)
 He has not taken olmesartan  for the past month, experiencing symptoms consistent with elevated blood pressure. Spironolactone  may aid in blood pressure control. - Restart olmesartan  40 mg daily. - Monitor blood pressure and assess response to spironolactone .

## 2024-06-07 LAB — RPR W/REFLEX TO TREPSURE

## 2024-06-08 ENCOUNTER — Telehealth: Payer: Self-pay

## 2024-06-08 ENCOUNTER — Ambulatory Visit: Payer: Self-pay | Admitting: Pediatrics

## 2024-06-08 LAB — COMPREHENSIVE METABOLIC PANEL WITH GFR
ALT: 24 IU/L (ref 0–44)
AST: 21 IU/L (ref 0–40)
Albumin: 4.2 g/dL (ref 4.1–5.1)
Alkaline Phosphatase: 80 IU/L (ref 47–123)
BUN/Creatinine Ratio: 7 — ABNORMAL LOW (ref 9–20)
BUN: 9 mg/dL (ref 6–20)
Bilirubin Total: <0.2 mg/dL (ref 0.0–1.2)
CO2: 18 mmol/L — ABNORMAL LOW (ref 20–29)
Calcium: 8.8 mg/dL (ref 8.7–10.2)
Chloride: 104 mmol/L (ref 96–106)
Creatinine, Ser: 1.28 mg/dL — ABNORMAL HIGH (ref 0.76–1.27)
Globulin, Total: 3.1 g/dL (ref 1.5–4.5)
Glucose: 122 mg/dL — ABNORMAL HIGH (ref 70–99)
Potassium: 3.9 mmol/L (ref 3.5–5.2)
Sodium: 140 mmol/L (ref 134–144)
Total Protein: 7.3 g/dL (ref 6.0–8.5)
eGFR: 74 mL/min/1.73 (ref >59)

## 2024-06-08 LAB — CBC WITH DIFFERENTIAL/PLATELET
Basophils Absolute: 0 x10E3/uL (ref 0.0–0.2)
Basos: 0 %
EOS (ABSOLUTE): 0.4 x10E3/uL (ref 0.0–0.4)
Eos: 7 %
Hematocrit: 42.9 % (ref 37.5–51.0)
Hemoglobin: 13.5 g/dL (ref 13.0–17.7)
Immature Grans (Abs): 0 x10E3/uL (ref 0.0–0.1)
Immature Granulocytes: 0 %
Lymphocytes Absolute: 2.4 x10E3/uL (ref 0.7–3.1)
Lymphs: 42 %
MCH: 27.4 pg (ref 26.6–33.0)
MCHC: 31.5 g/dL (ref 31.5–35.7)
MCV: 87 fL (ref 79–97)
Monocytes Absolute: 0.4 x10E3/uL (ref 0.1–0.9)
Monocytes: 6 %
Neutrophils Absolute: 2.6 x10E3/uL (ref 1.4–7.0)
Neutrophils: 45 %
Platelets: 225 x10E3/uL (ref 150–450)
RBC: 4.92 x10E6/uL (ref 4.14–5.80)
RDW: 14.1 % (ref 11.6–15.4)
WBC: 5.8 x10E3/uL (ref 3.4–10.8)

## 2024-06-08 LAB — HEMOGLOBIN A1C
Est. average glucose Bld gHb Est-mCnc: 120 mg/dL
Hgb A1c MFr Bld: 5.8 % — ABNORMAL HIGH (ref 4.8–5.6)

## 2024-06-08 LAB — RPR, QUANT: RPR, Quant: 1:64 {titer} — ABNORMAL HIGH

## 2024-06-08 LAB — RPR W/REFLEX TO TREPSURE: RPR: REACTIVE — AB

## 2024-06-08 NOTE — Telephone Encounter (Signed)
 Called patient to try to schedule an appointment to return to care, patient has a voicemail box that has not been set up this time.

## 2024-06-09 ENCOUNTER — Other Ambulatory Visit: Payer: Self-pay | Admitting: Pediatrics

## 2024-06-09 DIAGNOSIS — B20 Human immunodeficiency virus [HIV] disease: Secondary | ICD-10-CM

## 2024-06-09 NOTE — Progress Notes (Signed)
 Future labs placed  Clifford SHAUNNA Nett, MD

## 2024-06-09 NOTE — Progress Notes (Signed)
 Placed ID referral for HIV management.  Clifford SHAUNNA Nett, MD

## 2024-06-10 LAB — CHLAMYDIA/GONOCOCCUS/TRICHOMONAS, NAA
Chlamydia by NAA: NEGATIVE
Gonococcus by NAA: NEGATIVE
Trich vag by NAA: NEGATIVE

## 2024-06-12 ENCOUNTER — Telehealth: Payer: Self-pay

## 2024-06-12 NOTE — Telephone Encounter (Signed)
 Copied from CRM #[id number]. Topic: General - Other >> Jun 12, 2024 11:53 AM Jasmin G wrote: Reason for CRM: Mr. Salina Lloyd from [facility] Department requested a phone call from one of Dr. Herold nurses regarding recent syphilis test. Call back at [phone].

## 2024-06-13 ENCOUNTER — Other Ambulatory Visit

## 2024-06-13 DIAGNOSIS — B20 Human immunodeficiency virus [HIV] disease: Secondary | ICD-10-CM

## 2024-06-14 NOTE — Telephone Encounter (Signed)
 Called and spoke with Clifford Shields wanted to see if patient labs was repeated patient came in office yesterday to complete

## 2024-06-15 ENCOUNTER — Ambulatory Visit: Payer: Self-pay

## 2024-06-15 DIAGNOSIS — A539 Syphilis, unspecified: Secondary | ICD-10-CM

## 2024-06-15 LAB — RPR W/REFLEX TO TREPSURE: RPR: REACTIVE — AB

## 2024-06-15 LAB — HIV-1 RNA QUANT-NO REFLEX-BLD
HIV-1 RNA Viral Load Log: 3.079 {Log_copies}/mL
HIV-1 RNA Viral Load: 1200 {copies}/mL

## 2024-06-15 LAB — RPR, QUANT: RPR, Quant: 1:64 {titer} — ABNORMAL HIGH

## 2024-06-15 LAB — T.PALLIDUM AB, TOTAL: Treponema pallidum Antibodies: REACTIVE — AB

## 2024-06-15 MED ORDER — PENICILLIN G BENZATHINE 2400000 UNIT/4ML IM SUSY
2.4000 10*6.[IU] | PREFILLED_SYRINGE | Freq: Once | INTRAMUSCULAR | Status: AC
  Administered 2024-06-15: 2400000 [IU] via INTRAMUSCULAR

## 2024-06-15 NOTE — Progress Notes (Signed)
 [facility] Department STI clinic [address] [place] [address] Main phone: [phone]  STI visit  Subjective:  Clifford Shields is a 38 y.o. being seen today for an STI treatment visit. The patient reports they do not have symptoms.    Patient has the following medical conditions:  Patient Active Problem List   Diagnosis Date Noted   Gender affirming therapy 06/06/2024   Carpal tunnel syndrome 02/23/2024   Chronic pain of left knee 12/20/2023   Testosterone  deficiency 03/29/2015   Vitamin D  deficiency 03/29/2015   Marijuana abuse 03/29/2015   Tobacco abuse 02/22/2013   Primary hypertension 12/24/2009   Human immunodeficiency virus (HIV) disease (HCC) 11/23/2006   Chief Complaint  Patient presents with   SEXUALLY TRANSMITTED DISEASE   HPI Patient reports positive syphilis test. They have not had any symptoms. They do report a reaction to doxycycline.  Reaction was 2-3 times, localized to the genital area and was a rash/swelling/blisters. No history of HSV. Happened twice when they took doxycycline, and then they accidentally took a few doses of leftover doxycycline another time and had the same reaction. Bicillin  shortage in effect, however we do have doses in stock today. Approved giving Bicillin  today with Dr. Macario and Clifford Shields.  See flowsheet for further details and programmatic requirements  Hyperlink available at the top of the signed note in blue.  Flow sheet content below:  Pregnancy Intention Screening Does the patient want to become pregnant in the next year?: No Does the patient's partner want to become pregnant in the next year?: No Would the patient like to discuss contraceptive options today?: N/A Counseling Patient counseled to use condoms with all sex: Condoms given RTC in 2-3 weeks for test results: Yes Clinic will call if test results abnormal before test result appt.: Yes Test results given to  patient Patient counseled to use condoms with all sex: Condoms given  Screening for MPX risk:  Unexplained rash?  No   MSM?  No   Multiple or anonymous sex partners?  No   Any close or sexual contact with a person  diagnosed with MPX?  No   Any outside the [place]  where MPX is endemic?  No   High clinical suspicion for MPX?    -Unlikely to be chickenpox    -Lymphadenopathy    -Rash that presents in same phase of       evolution on any given body part  No   STI screening history: Last HIV test per patient/review of record was No results found for: HMHIVSCREEN  Lab Results  Component Value Date   HIV Reactive (A) 02/17/2015   Last HEPC test per patient/review of record was No results found for: HMHEPCSCREEN No components found for: HEPC   Last HEPB test per patient/review of record was No components found for: HMHEPBSCREEN   Immunization History  Administered Date(s) Administered   Hepatitis A, Adult 11/15/2014, 05/28/2015   Hepatitis B, ADULT 11/15/2014, 05/28/2015, 07/30/2015   Influenza Whole 11/04/2006   Influenza,inj,Quad PF,6+ Mos 07/11/2013, 11/15/2014, 05/28/2015, 11/05/2016, 07/31/2019   Meningococcal Mcv4o 11/05/2016, 07/31/2019   PFIZER(Purple Top)SARS-COV-2 Vaccination 02/07/2020, 03/28/2021   PPD Test 12/24/2009, 02/07/2013   Pneumococcal Conjugate-13 07/31/2019   Pneumococcal Polysaccharide-23 11/04/2006, 02/07/2013    The following portions of the patient's history were reviewed and updated as appropriate: allergies, current medications, past medical history, past social history, past surgical history and problem list.  Objective:  There were no vitals filed for this visit.  Physical Exam Constitutional:      Appearance: Normal appearance.  HENT:     Head: Normocephalic.     Mouth/Throat:     Mouth: Mucous membranes are moist.  Eyes:     General: No scleral icterus.       Right eye: No discharge.        Left eye: No discharge.  Pulmonary:      Effort: Pulmonary effort is normal.  Skin:    General: Skin is warm and dry.  Neurological:     General: No focal deficit present.     Mental Status: He is alert.  Psychiatric:        Mood and Affect: Mood normal.        Behavior: Behavior normal.    Assessment and Plan:  Clifford Shields is a 38 y.o. male presenting [facility] Department for STI screening  1. Syphilis (Primary) - Titer 1:64. Bicillin  given for titer 1:4 in February 2025. - Penicillin  G Benzathine SUSY 2,400,000 Units - Declines other testing as they had all the testing done with PCP - [name] is HIV positive and has close follow up  Patient does not have STI symptoms Patient accepted the following screenings: no screening today Recommended condom use with all sex Discussed importance of condom use for STI prevention Follow up with primary care provider or [facility] for follow up/titers in 6 months.  Return in about 6 months (around 12/14/2024).  Future Appointments  Date Time Provider Department Center  07/07/2024  1:00 PM Herold Hadassah SQUIBB, MD [facility] [address]    Clifford FORBES Satchel, NP

## 2024-06-15 NOTE — Progress Notes (Signed)
 Pt is her for Syphilis treatment. 1.2 MU Bicillin  IM injection given each at [facility] and RUOQ. Patient tolerated well to injection with no complications.Condoms given. Wilkie Drought, RN.

## 2024-06-16 ENCOUNTER — Ambulatory Visit: Payer: Self-pay | Admitting: Pediatrics

## 2024-06-28 ENCOUNTER — Other Ambulatory Visit: Payer: Self-pay | Admitting: Pediatrics

## 2024-06-28 ENCOUNTER — Telehealth: Payer: Self-pay | Admitting: Pediatrics

## 2024-06-28 ENCOUNTER — Telehealth: Payer: Self-pay

## 2024-06-28 DIAGNOSIS — B20 Human immunodeficiency virus [HIV] disease: Secondary | ICD-10-CM

## 2024-06-28 NOTE — Telephone Encounter (Signed)
 Copied from CRM [id number]. Topic: Clinical - Medication Refill >> Jun 28, 2024  9:27 AM Tiffini S wrote: Medication: bictegravir-emtricitabine -tenofovir  AF (BIKTARVY ) 50-200-25 MG TABS tablet, patient is asking for pcp to prescribe    Has the patient contacted their pharmacy? No (Agent: If no, request that the patient contact the pharmacy for the refill. If patient does not wish to contact the pharmacy document the reason why and proceed with request.) (Agent: If yes, when and what did the pharmacy advise?)  This is the patient's preferred pharmacy:    [id number] [place], [place] - [address] AT [facility] OF [address] [place] [address] [place] [address] Phone: [phone] Fax: [phone]  Is this the correct pharmacy for this prescription? Yes If no, delete pharmacy and type the correct one.   Has the prescription been filled recently? Yes  Is the patient out of the medication? Yes, out of medication for two month  Has the patient been seen for an appointment in the last year OR does the patient have an upcoming appointment? Yes  Can we respond through MyChart? No, please call the patient at [phone]  Agent: Please be advised that Rx refills may take up to 3 business days. We ask that you follow-up with your pharmacy.

## 2024-06-28 NOTE — Telephone Encounter (Unsigned)
 Copied from CRM #[id number]. Topic: General - Other >> Jun 28, 2024  9:34 AM Tiffini S wrote: Reason for CRM: Patient called stating that he has been receiving phone calls from a previous provider that he no longer wishes to see for care- Fleeta Rothman, Jomarie SAILOR, MD/ [facility] for Infectious Disease- have concerns with the facility and the wording on the entry way. States that it is a issue with HIPAA and he no long wants appointments there for privacy reasons.   Asked to talk with pcp or clinical for prescription refill:  bictegravir-emtricitabine -tenofovir  AF (BIKTARVY ) 50-200-25 MG TABS tablet- out of medication for two months  Please call the patient at [phone]- may be with clients today but asked for a second call if no answer.

## 2024-06-28 NOTE — Telephone Encounter (Signed)
 Patient called wanting to get medication refill for Biktarvy , said he has been seeing Dr. Herold at [facility] and will no longer fill medication due to insurance and told him he would need to reach out to our office. Patient is very upset because he no longer wanted to follow up at out clinic since last visit, ran in to people he know and family members. Said he felt like having the name of our clinic on our door was an invasion of privacy. If he could get medication refill be sent to Pharmacy: [facility], [place] [address] AT [facility] OF [address] best contact number is [phone]

## 2024-06-28 NOTE — Telephone Encounter (Signed)
 Attempted to contact patient by phone regarding medication refill request for Biktarvy . Unable to reach at this time. A MyChart message was sent to follow up. Message included an offer to transfer care to our [facility] office [facility]) if preferred, to help facilitate the referral and prescription needs while respecting the patient's privacy concerns.  Enis Kleine, LPN

## 2024-06-29 NOTE — Telephone Encounter (Signed)
 Ok for E2C2 to review.  Unable to leave message when calling back back to review reason for needing referral. If he returns calls please explain to him due to the complexity of his two current diagnosis that his PCP needed he accessed/treated by a specialist in those diseases. Once he has been seeing them and they have his medication treatment plan stable then family medicine can take over care for those diseases then. Please advise patient that his referral was sent to another office as he requested it be sent to another location.

## 2024-06-29 NOTE — Telephone Encounter (Signed)
 Requested medications are due for refill today.  yes  Requested medications are on the active medications list.  yes  Last refill. 02/23/2024 #30 0 rf  Future visit scheduled.   yes  Notes to clinic.  Medication is not assigned to a protocol please review for refill.    Requested Prescriptions  Pending Prescriptions Disp Refills   bictegravir-emtricitabine -tenofovir  AF (BIKTARVY ) 50-200-25 MG TABS tablet 30 tablet 0    Sig: Take 1 tablet by mouth daily.     Off-Protocol Failed - 06/29/2024  5:19 PM      Failed - Medication not assigned to a protocol, review manually.      Passed - Valid encounter within last 12 months    Recent Outpatient Visits           3 weeks ago Primary hypertension   [facility] Herold Hadassah SQUIBB, MD   4 months ago Carpal tunnel syndrome of right wrist   [facility] Herold Hadassah SQUIBB, MD   4 months ago Primary hypertension   [place] [address] [facility] [place], [place], DO   6 months ago Primary hypertension   [facility] Herold Hadassah SQUIBB, MD   4 years ago Hypertension, unspecified type   [facility] [place] [facility], Mikel HERO, MD       Future Appointments             In 1 week Fayette Bodily, MD [facility]

## 2024-07-04 MED ORDER — BIKTARVY 50-200-25 MG PO TABS: 1.0000 | ORAL_TABLET | Freq: Every day | ORAL | 2 refills | Status: AC

## 2024-07-05 ENCOUNTER — Ambulatory Visit: Admitting: Pediatrics

## 2024-07-05 ENCOUNTER — Encounter: Payer: Self-pay | Admitting: Pediatrics

## 2024-07-05 VITALS — BP 178/104 | HR 73 | Temp 98.0°F

## 2024-07-05 DIAGNOSIS — I10 Essential (primary) hypertension: Secondary | ICD-10-CM | POA: Diagnosis not present

## 2024-07-05 DIAGNOSIS — F649 Gender identity disorder, unspecified: Secondary | ICD-10-CM

## 2024-07-05 DIAGNOSIS — B20 Human immunodeficiency virus [HIV] disease: Secondary | ICD-10-CM | POA: Diagnosis not present

## 2024-07-05 MED ORDER — SPIRONOLACTONE 100 MG PO TABS: 150.0000 mg | ORAL_TABLET | Freq: Every day | ORAL | 1 refills | Status: DC

## 2024-07-05 NOTE — Progress Notes (Signed)
 Office Visit  BP (!) 178/104   Pulse 73   Temp 98 F (36.7 C) (Oral)   SpO2 99%    Subjective:    Patient ID: Clifford Shields, male    DOB: 10-25-1985, 38 y.o.   MRN: [id number]  HPI: Clifford Shields is a 38 y.o. male  Chief Complaint  Patient presents with   Hypertension    #HTN Declined repeat reading Has been taking medications as prescribed  #HIV  Pt recommended for ID follow up based on most recent lab results w comorbid syphillis Biktarvy  sent but has not yet picked up  #HRT Pt started spironolactone  but has not yet noticed any breast changes  Relevant past medical, surgical, family and social history reviewed and updated as indicated. Interim medical history since our last visit reviewed. Allergies and medications reviewed and updated.  ROS per HPI unless specifically indicated above     Objective:    BP (!) 178/104   Pulse 73   Temp 98 F (36.7 C) (Oral)   SpO2 99%   Wt Readings from Last 3 Encounters:  07/11/24 223 lb (101.2 kg)  06/06/24 218 lb 12.8 oz (99.2 kg)  02/23/24 219 lb 9.6 oz (99.6 kg)     Physical Exam Constitutional:      Appearance: Normal appearance.  Pulmonary:     Effort: Pulmonary effort is normal.  Musculoskeletal:        General: Normal range of motion.  Skin:    Comments: Normal skin color  Neurological:     General: No focal deficit present.     Mental Status: He is alert. Mental status is at baseline.  Psychiatric:        Mood and Affect: Mood normal.        Behavior: Behavior normal.        Thought Content: Thought content normal.         07/11/2024   11:46 AM 07/05/2024    2:27 PM 06/06/2024    9:06 AM 02/23/2024   10:32 AM 12/10/2023    3:34 PM  Depression screen PHQ 2/9  Decreased Interest 0 0 2 3 3   Down, Depressed, Hopeless 0 0 2 3 3   PHQ - 2 Score 0 0 4 6 6   Altered sleeping 0 0 2 3 3   Tired, decreased energy 0 0 2 3 3   Change in appetite 0 0 2 3 3   Feeling bad or failure about yourself  0 0 2  3 3   Trouble concentrating 0 0 2 3 3   Moving slowly or fidgety/restless 0 0 2 0 1  Suicidal thoughts 0 0 2  0  PHQ-9 Score 0 0 18 21 22   Difficult doing work/chores  Not difficult at all Somewhat difficult Extremely dIfficult Extremely dIfficult       07/11/2024   11:46 AM 07/05/2024    2:27 PM 06/06/2024    9:06 AM 02/23/2024   10:32 AM  GAD 7 : Generalized Anxiety Score  Nervous, Anxious, on Edge 0 0 2 3  Control/stop worrying 0 0 2 3  Worry too much - different things 0 0 2 3  Trouble relaxing 0 0 2 3  Restless 0 0 2 3  Easily annoyed or irritable 0 0 2 3  Afraid - awful might happen 0 0 2 3  Total GAD 7 Score 0 0 14 21  Anxiety Difficulty  Not difficult at all Somewhat difficult Extremely difficult       Assessment &  Plan:  Assessment & Plan   Primary hypertension Assessment & Plan: Poorly controlled. Increase spiro which may help. Considering adding amlodipine  at follow up.   Orders: -     Basic metabolic panel with GFR  Human immunodeficiency virus (HIV) disease (HCC) Assessment & Plan: Has refills not yet picked up. Scheduled with new ID office.   Orders: -     HIV-1 RNA ultraquant reflex to gentyp+ -     T-helper cells (CD4) count (not at [facility]); Future -     REFLEX TO GENOSURE(R) MG  Gender dysphoria Assessment & Plan: Minimal changes, increase to 150mg . Consider low dose feminizing hormone at follow up.   Orders: -     Spironolactone ; Take 1.5 tablets (150 mg total) by mouth daily.  Dispense: 135 tablet; Refill: 1     Follow up plan: Return in about 4 weeks (around 08/02/2024) for HTN.  Clifford SHAUNNA Nett, MD

## 2024-07-05 NOTE — Telephone Encounter (Signed)
 Patient was seen in office 07/05/24

## 2024-07-05 NOTE — Patient Instructions (Signed)
 For the infectious disease scheduling call: [phone]

## 2024-07-06 LAB — BASIC METABOLIC PANEL WITH GFR
BUN/Creatinine Ratio: 8 — ABNORMAL LOW (ref 9–20)
BUN: 9 mg/dL (ref 6–20)
CO2: 23 mmol/L (ref 20–29)
Calcium: 8.9 mg/dL (ref 8.7–10.2)
Chloride: 104 mmol/L (ref 96–106)
Creatinine, Ser: 1.18 mg/dL (ref 0.76–1.27)
Glucose: 94 mg/dL (ref 70–99)
Potassium: 4.2 mmol/L (ref 3.5–5.2)
Sodium: 139 mmol/L (ref 134–144)
eGFR: 82 mL/min/1.73 (ref >59)

## 2024-07-07 ENCOUNTER — Ambulatory Visit: Admitting: Pediatrics

## 2024-07-11 ENCOUNTER — Encounter: Payer: Self-pay | Admitting: Infectious Diseases

## 2024-07-11 ENCOUNTER — Ambulatory Visit: Attending: Infectious Diseases | Admitting: Infectious Diseases

## 2024-07-11 ENCOUNTER — Ambulatory Visit: Payer: Self-pay | Admitting: Pediatrics

## 2024-07-11 VITALS — BP 148/101 | HR 97 | Temp 97.8°F | Ht 70.0 in | Wt 223.0 lb

## 2024-07-11 DIAGNOSIS — Z79899 Other long term (current) drug therapy: Secondary | ICD-10-CM | POA: Diagnosis not present

## 2024-07-11 DIAGNOSIS — I10 Essential (primary) hypertension: Secondary | ICD-10-CM | POA: Insufficient documentation

## 2024-07-11 DIAGNOSIS — B20 Human immunodeficiency virus [HIV] disease: Secondary | ICD-10-CM | POA: Diagnosis present

## 2024-07-11 MED ORDER — DOXYCYCLINE MONOHYDRATE 100 MG PO TABS: 200.0000 mg | ORAL_TABLET | ORAL | 0 refills | Status: DC | PRN

## 2024-07-11 NOTE — Progress Notes (Signed)
 NAME: Clifford Shields  DOB: 01/01/1986  MRN: [id number]  Date/Time: 07/11/2024 12:01 PM   Subjective:   ? Clifford Shields is a 38 year old male with HIV who is transferring HIV care to my clinic- was previously followed at [facility]- last seen Feb 2025 Because of privacy use he is coming to this clinic He has been non compliant with meds an visits  He has been living with HIV since being diagnosed at the age of 44. Initially treated with Genvoya , he is now on Biktarvy . He has missed doses due to not receiving refills, leading to a lapse in treatment. His viral load was 76,000 copies in February 2025 and 1,200 copies in September 2025. His CD4 count was 509 in February 2025 and was previously 781.  He tested positive for syphilis in September 2025 and was given Benzathine penicillin  2.4 million unit injection In the past he was prescribed doxycycline. He experienced blistering on his penis, which he suspects was an allergic reaction to the medication. He practices safe sex but engages in sex work, increasing his risk of sexually transmitted infections.  He is currently taking spironolactone , prescribed by his primary care doctor as an androgen blocker, to enhance his physical appearance. He has not previously used hormone therapy but is considering it to feel more comfortable with his body. He has not decided on gender reassignment surgery.  He has a history of high blood pressure, for which he takes medication nightly. He smokes about seven to eight cigarettes a day and occasionally uses marijuana. He does not consume alcohol frequently and has stopped vaping.  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 He has a history of participating in HIV research, including trials with Cabenuva, which he discontinued due to adverse reactions. HIV diagnosed when he was 19 Nadir Cd4 -UKnown  OI NA HAARt history Strilbild- first regimen- Elvitegravir/cobicistat/TDF/3TC Cabenuva  injection- nodules Genvoya    Acquired thru sex with men Genotype ? Past Medical History:  Diagnosis Date   HIV disease (HCC)    Hypertension    Marijuana use    Poor dentition 02/27/2021   Tibial plateau fracture 03/28/2015    Past Surgical History:  Procedure Laterality Date   ORIF TIBIA PLATEAU Left 03/28/2015   Procedure: OPEN REDUCTION INTERNAL FIXATION (ORIF) LEFT TIBIAL PLATEAU FRACTURE;  Surgeon: Ozell Bruch, MD;  Location: [facility] OR;  Service: Orthopedics;  Laterality: Left;    Social History   Socioeconomic History   Marital status: Media Planner    Spouse name: Not on file   Number of children: 0   Years of education: Not on file   Highest education level: Not on file  Occupational History   Not on file  Tobacco Use   Smoking status: Every Day    Current packs/day: 0.50    Average packs/day: 0.5 packs/day for 15.0 years (7.5 ttl pk-yrs)    Types: Cigarettes    Passive exposure: Never   Smokeless tobacco: Never  Vaping Use   Vaping status: Never Used  Substance and Sexual Activity   Alcohol use: Yes    Alcohol/week: 0.0 standard drinks of alcohol    Comment: rarely   Drug use: Not Currently    Frequency: 14.0 times per week    Types: Marijuana   Sexual activity: Yes    Partners: Male    Birth control/protection: Condom  Other Topics Concern   Not on file  Social History Narrative   Not on file  Social Drivers of Corporate Investment Banker Strain: Low Risk  (08/22/2019)   Overall Financial Resource Strain (CARDIA)    Difficulty of Paying Living Expenses: Not hard at all  Food Insecurity: No Food Insecurity (08/22/2019)   Hunger Vital Sign    Worried About Running Out of Food in the Last Year: Never true    Ran Out of Food in the Last Year: Never true  Transportation Needs: No Transportation Needs (08/22/2019)   PRAPARE - Administrator, Civil Service (Medical): No    Lack of Transportation (Non-Medical): No  Physical Activity:  Insufficiently Active (08/22/2019)   Exercise Vital Sign    Days of Exercise per Week: 3 days    Minutes of Exercise per Session: 30 min  Stress: No Stress Concern Present (08/22/2019)   [employer] of Occupational Health - Occupational Stress Questionnaire    Feeling of Stress : Only a little  Social Connections: Unknown (08/22/2019)   Social Connection and Isolation Panel    Frequency of Communication with Friends and Family: Three times a week    Frequency of Social Gatherings with Friends and Family: Twice a week    Attends Religious Services: Patient declined    Database Administrator or Organizations: Patient declined    Attends Banker Meetings: Patient declined    Marital Status: Living with partner  Intimate Partner Violence: Not At Risk (08/22/2019)   Humiliation, Afraid, Rape, and Kick questionnaire    Fear of Current or Ex-Partner: No    Emotionally Abused: No    Physically Abused: No    Sexually Abused: No    Family History  Problem Relation Age of Onset   Kidney disease Mother    Hypertension Mother    Kidney disease Father    Hypertension Father    CVA Father 26   Allergies  Allergen Reactions   Doxycycline Itching and Rash    Has had similar reaction twice with doxycycline resolves with discontinuation-develops into blisters.   ? Current Outpatient Medications  Medication Sig Dispense Refill   bictegravir-emtricitabine -tenofovir  AF (BIKTARVY ) 50-200-25 MG TABS tablet Take 1 tablet by mouth daily. 30 tablet 2   olmesartan  (BENICAR ) 40 MG tablet Take 1 tablet (40 mg total) by mouth daily. 90 tablet 3   spironolactone  (ALDACTONE ) 100 MG tablet Take 1.5 tablets (150 mg total) by mouth daily. 135 tablet 1   No current facility-administered medications for this visit.    REVIEW OF SYSTEMS:  Const: negative fever, negative chills, negative weight loss Eyes: negative diplopia or visual changes, negative eye pain ENT: negative coryza, negative sore  throat Resp: negative cough, hemoptysis, dyspnea Cards: negative for chest pain, palpitations, lower extremity edema GU: negative for frequency, dysuria and hematuria Skin: negative for rash and pruritus Heme: negative for easy bruising and gum/nose bleeding MS: negative for myalgias, arthralgias, back pain and muscle weakness Neurolo:negative for headaches, dizziness, vertigo, memory problems  Psych: negative for feelings of anxiety, depression  Pertinent Positives include : Objective:  VITALS:  BP (!) 148/101   Pulse 97   Temp 97.8 F (36.6 C) (Temporal)   Ht 5' 10 (1.778 m)   Wt 223 lb (101.2 kg)   SpO2 96%   BMI 32.00 kg/m  PHYSICAL EXAM:  General: Alert, cooperative, no distress, appears stated age.  Head: Normocephalic, without obvious abnormality, atraumatic. Eyes: Conjunctivae clear, anicteric sclerae. Pupils are equal Nose: Nares normal. No drainage or sinus tenderness. Throat: Lips, mucosa, and tongue normal.  No Thrush Neck: Supple, symmetrical, no adenopathy, thyroid : non tender no carotid bruit and no JVD. Back: No CVA tenderness. Lungs: Clear to auscultation bilaterally. No Wheezing or Rhonchi. No rales. Heart: Regular rate and rhythm, no murmur, rub or gallop. Abdomen: Soft, non-tender,not distended. Bowel sounds normal. No masses Extremities: Extremities normal, atraumatic, no cyanosis. No edema. No clubbing Skin: No rashes or lesions. Not Jaundiced Lymph: Cervical, supraclavicular normal. Neurologic: Grossly non-focal Pertinent Labs  Health maintenance Vaccination  Vaccine Date last given comment  Influenza    Hepatitis B    Hepatitis A    Prevnar-PCV-13    Pneumovac-PPSV-23    TdaP    HPV    Shingrix ( zoster vaccine)     ______________________  Labs Lab Result  Date comment  HIV VL     CD4     Genotype NEG 2/25   [id number]     HIV antibody     RPR 1;64 06/13/24 Treated with penicillin  at [facility]  Quantiferon Gold     Hep C ab     Hepatitis  B-ab,ag,c     Hepatitis A-IgM, IgG /T     Lipid     GC/CHL     PAP     HB,PLT,Cr, LFT       Preventive  Procedure Result  Date comment  colonoscopy     Mammogram     Dental exam     Opthal       Impression/Recommendation ? ?Human immunodeficiency virus (HIV) infection HIV infection diagnosed at age 62, currently 38 years old. Previously on Genvoya  and Biktarvy , but has been off medication due to lack of refills. Viral load increased to 33,000 copies, with a CD4 count of 509. Importance of maintaining low viral load and high CD4 count for immune health discussed. - Obtain genotype results to confirm Biktarvy  efficacy - Prescribe Biktarvy  - Monitor viral load and CD4 count in one month  Sexually transmitted infection risk (syphilis, chlamydia, gonorrhea) and post-exposure prophylaxis management Positive syphilis test in September. Engages in sex work, increasing STI risk. Post-exposure prophylaxis (PEP) with doxycycline for syphilis, chlamydia, and gonorrhea discussed. He is aware of the need for PEP after unprotected sex. - Prescribe doxycycline for PEP He had blisters on his penis in the past when he took Doxy but he thinks it was the STD that caused the lesions so He is willing to try after unprotected sex. HE never had any oral leions or difficulty in breathing or generalized rh or HIVES - Instruct to take two doxycycline tablets after unprotected sex Monitor closely for any allergic reaction  Need preventative labs HEPC HEPB sab, Sag, Cab HEPA Quantiferon GOLD HIV RNA MMR antibodies Cd4 A1c Lipid  Need to get Vaccination records- especially HPV- he will ask his mother he said ? _HTN on  olmesartan   On Spironolactone  as androgen  blocker - given by his PCP   __________________________________________________ Discussed with patient, in detail  Addendum- I reviewed his medical records and found in 07/04/2021 he had gone to ED c/o pain swelling and blisters on his  penis which he had said started after taking Doxy for a suspected STD- the ED did work up including HSV culture, orthopox DNA, RPR, GC.CHL and prescribed Hydrocortisone  1% All the STD screen came back neg And hence it was considered that Doxy could have caused it Apparently there was one other time this happened So need to get all the records Called patient in the evening and told him not  to take Doxy . He understood . He said that he has not collected prescription from pharmacy

## 2024-07-11 NOTE — Patient Instructions (Signed)
 You came in today for follow-up care and medication management for your HIV. We discussed your current treatment, recent health issues, and general health maintenance.  YOUR PLAN:  -HUMAN IMMUNODEFICIENCY VIRUS (HIV) INFECTION: HIV is a virus that attacks the immune system. It's important to keep your viral load low and your CD4 count high to maintain your immune health. We will obtain genotype results to confirm that Biktarvy  is effective for you, prescribe Biktarvy , and monitor your viral load and CD4 count in one month.  -SEXUALLY TRANSMITTED INFECTION RISK (SYPHILIS, CHLAMYDIA, GONORRHEA) AND POST-EXPOSURE PROPHYLAXIS MANAGEMENT: You are at increased risk for sexually transmitted infections (STIs) due to your sex work. We discussed the importance of post-exposure prophylaxis (PEP) to prevent STIs. You should take two doxycycline tablets after unprotected sex to reduce your risk of syphilis, chlamydia, and gonorrhea.  -ALLERGIC REACTION TO DOXYCYCLINE: You reported blistering on your penis after taking doxycycline, but at that time you also had a STD So not sure whether it was doxy or the primary STD that caused the blistering. We discussed re-challenging with doxycycline to confirm if you are allergic. You should monitor for any allergic reactions when taking the medication.  -GENERAL HEALTH MAINTENANCE: We discussed the HPV vaccine, which can prevent cancerous warts, especially in men who have sex with men. You are hesitant about vaccines but will consider it. We will check your vaccination history and consider the HPV vaccine if you have not previously received it.  INSTRUCTIONS:  We will monitor your viral load and CD4 count in one month. Please take two doxycycline tablets after unprotected sex and monitor for any allergic reactions. Consider the HPV vaccine if you have not previously received it.

## 2024-07-12 ENCOUNTER — Encounter: Payer: Self-pay | Admitting: Pediatrics

## 2024-07-12 NOTE — Assessment & Plan Note (Signed)
 Poorly controlled. Increase spiro which may help. Considering adding amlodipine  at follow up.

## 2024-07-12 NOTE — Assessment & Plan Note (Signed)
 Has refills not yet picked up. Scheduled with new ID office.

## 2024-07-12 NOTE — Assessment & Plan Note (Signed)
 Minimal changes, increase to 150mg . Consider low dose feminizing hormone at follow up.

## 2024-07-13 LAB — REFLEX TO GENOSURE(R) MG

## 2024-07-13 LAB — HIV-1 RNA ULTRAQUANT REFLEX TO GENTYP+
HIV1 RNA # SerPl PCR: 33700 {copies}/mL
HIV1 RNA Plas PCR-Log#: 4.528 {Log_copies}/mL

## 2024-09-20 ENCOUNTER — Other Ambulatory Visit: Payer: Self-pay

## 2024-09-20 ENCOUNTER — Ambulatory Visit

## 2024-09-20 VITALS — BP 158/90 | HR 81 | Temp 98.0°F | Resp 17 | Ht 70.0 in | Wt 218.0 lb

## 2024-09-20 DIAGNOSIS — I10 Essential (primary) hypertension: Secondary | ICD-10-CM

## 2024-09-20 DIAGNOSIS — G5601 Carpal tunnel syndrome, right upper limb: Secondary | ICD-10-CM

## 2024-09-20 DIAGNOSIS — F649 Gender identity disorder, unspecified: Secondary | ICD-10-CM | POA: Diagnosis not present

## 2024-09-20 DIAGNOSIS — B20 Human immunodeficiency virus [HIV] disease: Secondary | ICD-10-CM

## 2024-09-20 MED ORDER — SPIRONOLACTONE 100 MG PO TABS: 150.0000 mg | ORAL_TABLET | Freq: Every day | ORAL | 1 refills | Status: AC

## 2024-09-20 MED ORDER — AMLODIPINE BESYLATE 5 MG PO TABS: 5.0000 mg | ORAL_TABLET | Freq: Every day | ORAL | 1 refills | Status: AC

## 2024-09-20 NOTE — Assessment & Plan Note (Signed)
 Desires to transition from male to male. Discussed endocrinology referral, patient declines at this time.  Spironolactone  previously managed by Dr. Herold - refill sent. Orders:   spironolactone  (ALDACTONE ) 100 MG tablet; Take 1.5 tablets (150 mg total) by mouth daily.

## 2024-09-20 NOTE — Progress Notes (Signed)
 "  BP (!) 165/91 (BP Location: Left Arm, Cuff Size: Normal)   Pulse 81   Temp 98 F (36.7 C) (Oral)   Resp 17   Ht 5' 10 (1.778 m)   Wt 218 lb (98.9 kg)   BMI 31.28 kg/m    Subjective:    Patient ID: Clifford Shields, male    DOB: 1986/08/30, 39 y.o.   MRN: [id number]  HPI: Clifford Shields is a 39 y.o. male - has been out of spironolactone  x1+ week and blood pressure has been increasing.  Declines to begin checking at home.  Spironolactone  - felt like dose is working, now  interested in fully transitioning MTF.    Biktarvy  - declined monitoring HIV testing because office was not able to do CD4 count here - requesting help getting that appt scheduled.  Chief Complaint  Patient presents with   HIV   Hypertension    Relevant past medical, surgical, family and social history reviewed and updated as indicated. Interim medical history since our last visit reviewed. Allergies and medications reviewed and updated.  Review of Systems  Constitutional:  Positive for fatigue. Negative for appetite change, chills, fever and unexpected weight change.  HENT:  Positive for congestion.   Eyes:  Negative for photophobia.  Respiratory:  Negative for apnea, chest tightness, shortness of breath and wheezing.   Cardiovascular:  Negative for chest pain and palpitations.  Gastrointestinal:  Positive for abdominal pain. Negative for blood in stool, constipation and diarrhea.  Endocrine: Positive for polydipsia. Negative for cold intolerance and heat intolerance.  Genitourinary:  Negative for difficulty urinating, flank pain, frequency, genital sores, hematuria, penile discharge, penile pain, penile swelling, scrotal swelling and testicular pain.  Musculoskeletal:  Negative for myalgias.  Skin:  Negative for rash.  Neurological:  Negative for dizziness, tremors, weakness and headaches.  Hematological:  Negative for adenopathy.  Psychiatric/Behavioral:  Negative for agitation and behavioral  problems.     Per HPI unless specifically indicated above     Objective:    BP (!) 165/91 (BP Location: Left Arm, Cuff Size: Normal)   Pulse 81   Temp 98 F (36.7 C) (Oral)   Resp 17   Ht 5' 10 (1.778 m)   Wt 218 lb (98.9 kg)   BMI 31.28 kg/m   Wt Readings from Last 3 Encounters:  09/20/24 218 lb (98.9 kg)  07/11/24 223 lb (101.2 kg)  06/06/24 218 lb 12.8 oz (99.2 kg)    Physical Exam Constitutional:      General: He is not in acute distress.    Appearance: Normal appearance. He is obese. He is not ill-appearing.  HENT:     Head: Normocephalic and atraumatic.     Right Ear: Tympanic membrane normal.     Left Ear: Tympanic membrane normal.     Nose: Nose normal.  Cardiovascular:     Rate and Rhythm: Normal rate and regular rhythm.     Pulses: Normal pulses.     Heart sounds: Normal heart sounds.  Pulmonary:     Effort: Pulmonary effort is normal.     Breath sounds: Normal breath sounds.  Abdominal:     General: Abdomen is flat.  Musculoskeletal:        General: Tenderness present. Normal range of motion.     Cervical back: Normal range of motion and neck supple.  Skin:    General: Skin is warm and dry.  Neurological:     General: No focal deficit present.  Mental Status: He is alert and oriented to person, place, and time.     Results for orders placed or performed in visit on 07/05/24  Basic Metabolic Panel (BMET)   Collection Time: 07/05/24  2:34 PM  Result Value Ref Range   Glucose 94 70 - 99 mg/dL   BUN 9 6 - 20 mg/dL   Creatinine, Ser 8.81 0.76 - 1.27 mg/dL   eGFR 82 >40 fO/fpw/8.26   BUN/Creatinine Ratio 8 (L) 9 - 20   Sodium 139 134 - 144 mmol/L   Potassium 4.2 3.5 - 5.2 mmol/L   Chloride 104 96 - 106 mmol/L   CO2 23 20 - 29 mmol/L   Calcium 8.9 8.7 - 10.2 mg/dL  HIV-1 RNA ultraquant reflex to gentyp+   Collection Time: 07/05/24  2:34 PM  Result Value Ref Range   HIV1 RNA # SerPl PCR 33,700 copies/mL   HIV1 RNA Plas PCR-Log# 4.528  log10copy/mL   Genotype Assay Comment   REFLEX TO GENOSURE(R) MG   Collection Time: 07/05/24  2:34 PM  Result Value Ref Range   HIV GenoSure Comment    HIV GenoSure(R) Comment       Assessment & Plan:   Assessment & Plan Primary hypertension Uncontrolled hypertension, declines to keep home bp reading log.  Has been out of spironolactone  x1 week, so believes reading is higher because of this. Refill sent, start amlodipine , monitor for side effects and stop if any allergic reaction occurs. Follow up in 1 month for bp recheck and will try ccb/arb combo pill if appropriate. Orders:   amLODipine  (NORVASC ) 5 MG tablet; Take 1 tablet (5 mg total) by mouth daily.  Gender dysphoria Desires to transition from male to male. Discussed endocrinology referral, patient declines at this time.  Spironolactone  previously managed by Dr. Herold - refill sent. Orders:   spironolactone  (ALDACTONE ) 100 MG tablet; Take 1.5 tablets (150 mg total) by mouth daily.  Human immunodeficiency virus (HIV) disease (HCC) Please follow with infectious disease (Dr. Fayette) MD for management of Biktarvy .  Office staff assisting with making appointment.  Pt declined labs in office here as this office was not able to run CD4 level in past and they do not want to have labs drawn twice.  Orders:   Ambulatory referral to Infectious Disease  Carpal tunnel syndrome of right wrist Has not been using brace at home - encouraged to rest and use brace and ice when experiencing pain.     Follow up plan: Return in about 4 weeks (around 10/18/2024) for bp recheck.  TOC appointment scheduled with Darice, NP in May 2026.     "

## 2024-09-20 NOTE — Assessment & Plan Note (Signed)
 Please follow with infectious disease (Dr. Fayette) MD for management of Biktarvy .  Office staff assisting with making appointment.  Pt declined labs in office here as this office was not able to run CD4 level in past and they do not want to have labs drawn twice.  Orders:   Ambulatory referral to Infectious Disease

## 2024-09-20 NOTE — Assessment & Plan Note (Signed)
 Has not been using brace at home - encouraged to rest and use brace and ice when experiencing pain.

## 2024-09-20 NOTE — Assessment & Plan Note (Signed)
 Uncontrolled hypertension, declines to keep home bp reading log.  Has been out of spironolactone  x1 week, so believes reading is higher because of this. Refill sent, start amlodipine , monitor for side effects and stop if any allergic reaction occurs. Follow up in 1 month for bp recheck and will try ccb/arb combo pill if appropriate. Orders:   amLODipine  (NORVASC ) 5 MG tablet; Take 1 tablet (5 mg total) by mouth daily.

## 2024-09-21 NOTE — Telephone Encounter (Signed)
 Duplicate Request- filled 09/20/24 #30 1RF Requested Prescriptions  Pending Prescriptions Disp Refills   amLODipine  (NORVASC ) 5 MG tablet [Pharmacy Med Name: AMLODIPINE  BESYLATE 5MG  TABLETS] 90 tablet     Sig: TAKE 1 TABLET(5 MG) BY MOUTH DAILY     Cardiovascular: Calcium Channel Blockers 2 Failed - 09/21/2024  1:48 PM      Failed - Last BP in normal range    BP Readings from Last 1 Encounters:  09/20/24 (!) 158/90         Passed - Last Heart Rate in normal range    Pulse Readings from Last 1 Encounters:  09/20/24 81         Passed - Valid encounter within last 6 months    Recent Outpatient Visits           Yesterday Primary hypertension   [facility] Delon HERO, FNP   2 months ago Primary hypertension   [facility] Herold Hadassah SQUIBB, MD   3 months ago Primary hypertension   [facility] Herold Hadassah SQUIBB, MD   7 months ago Carpal tunnel syndrome of right wrist   [facility] Herold Hadassah SQUIBB, MD   7 months ago Primary hypertension   [facility] [place], [place], [place]

## 2024-09-27 ENCOUNTER — Telehealth: Payer: Self-pay

## 2024-09-27 NOTE — Telephone Encounter (Signed)
 Patient seen by [name]. FYI in regards to patients medications.

## 2024-09-27 NOTE — Telephone Encounter (Signed)
 Copied from CRM #[id number]. Topic: Clinical - Medication Question >> Sep 27, 2024 10:47 AM Myrick T wrote: Reason for CRM: patient called stated he only wants to be on olmesartan  (BENICAR ) 40 MG tablet and spironolactone  (ALDACTONE ) 100 MG tablet. He is aware he was prescribed amLODipine  (NORVASC ) 5 MG tablet but that was not what he wanted. Patient says he went to pick up his meds at [facility] but was not given the spironolactone  (ALDACTONE ) 100 MG tablet. Patient will f/u with pharmacy.

## 2024-10-03 ENCOUNTER — Telehealth: Payer: Self-pay | Admitting: Infectious Diseases

## 2024-10-03 NOTE — Telephone Encounter (Signed)
 Clifford Shields I am unsure what was discussed when patient was scheduled with Dr. Fayette in [place]. He has a [place] address and previously a Dr. Fleeta Rothman patient. If [place] is not convenient for him then he can continue seeing Dr. Fleeta Rothman or we are happy to refer him somewhere different. Please reach out to him Shields.  Clifford Shields Clifford Shields, CMA

## 2024-10-03 NOTE — Telephone Encounter (Signed)
 Clifford Shields called to reschedule his appt with Dr. Fayette. Pt stated he only has availability on Wednesdays but the office is opened Tuesdays and Thursdays. Pt stated he will not take off work and asked is there anyone else that can see him. I mentioned [facility] has more options but pt declined. He stated this inconvenience is the hold up with scheduling. I suggested he could do labs elsewhere on his day off and complete virtual visits but I would need the nurse or Dr. Fayette to advise before scheduling. Pt can be reached at [phone].

## 2024-10-04 NOTE — Telephone Encounter (Signed)
 So is he going to find a new provider outside of [facility] and [facility]?

## 2024-10-25 ENCOUNTER — Ambulatory Visit

## 2025-01-15 ENCOUNTER — Encounter: Admitting: Nurse Practitioner
